# Patient Record
Sex: Female | Born: 1970 | Race: Black or African American | Hispanic: No | Marital: Single | Smoking: Never smoker
Health system: Southern US, Community
[De-identification: ages and names within clinical notes are randomized; demographics above are authoritative.]

## PROBLEM LIST (undated history)

## (undated) DIAGNOSIS — I10 Essential (primary) hypertension: Secondary | ICD-10-CM

## (undated) DIAGNOSIS — G8929 Other chronic pain: Secondary | ICD-10-CM

## (undated) DIAGNOSIS — D509 Iron deficiency anemia, unspecified: Secondary | ICD-10-CM

## (undated) DIAGNOSIS — R51 Headache: Secondary | ICD-10-CM

## (undated) DIAGNOSIS — R7309 Other abnormal glucose: Secondary | ICD-10-CM

## (undated) DIAGNOSIS — R519 Headache, unspecified: Secondary | ICD-10-CM

## (undated) DIAGNOSIS — K219 Gastro-esophageal reflux disease without esophagitis: Secondary | ICD-10-CM

## (undated) HISTORY — DX: Other abnormal glucose: R73.09

## (undated) HISTORY — DX: Essential (primary) hypertension: I10

## (undated) HISTORY — DX: Headache, unspecified: R51.9

## (undated) HISTORY — PX: OTHER SURGICAL HISTORY: SHX169

## (undated) HISTORY — PX: INDUCED ABORTION: SHX677

## (undated) HISTORY — PX: ABDOMINAL HYSTERECTOMY: SHX81

## (undated) HISTORY — PX: TUBAL LIGATION: SHX77

## (undated) HISTORY — DX: Headache: R51

## (undated) HISTORY — PX: NOSE SURGERY: SHX723

## (undated) HISTORY — DX: Gastro-esophageal reflux disease without esophagitis: K21.9

## (undated) HISTORY — DX: Iron deficiency anemia, unspecified: D50.9

## (undated) HISTORY — DX: Other chronic pain: G89.29

## (undated) HISTORY — PX: BREAST REDUCTION SURGERY: SHX8

---

## 2000-05-10 ENCOUNTER — Other Ambulatory Visit: Admission: RE | Admit: 2000-05-10 | Discharge: 2000-05-10 | Payer: Self-pay | Admitting: Emergency Medicine

## 2001-07-06 ENCOUNTER — Encounter: Admission: RE | Admit: 2001-07-06 | Discharge: 2001-07-06 | Payer: Self-pay | Admitting: Emergency Medicine

## 2001-07-06 ENCOUNTER — Encounter: Payer: Self-pay | Admitting: Emergency Medicine

## 2002-03-02 ENCOUNTER — Emergency Department (HOSPITAL_COMMUNITY): Admission: EM | Admit: 2002-03-02 | Discharge: 2002-03-02 | Payer: Self-pay | Admitting: Emergency Medicine

## 2002-03-02 ENCOUNTER — Encounter: Payer: Self-pay | Admitting: Emergency Medicine

## 2002-03-08 ENCOUNTER — Encounter: Payer: Self-pay | Admitting: Emergency Medicine

## 2002-03-08 ENCOUNTER — Ambulatory Visit (HOSPITAL_COMMUNITY): Admission: RE | Admit: 2002-03-08 | Discharge: 2002-03-08 | Payer: Self-pay | Admitting: Emergency Medicine

## 2002-03-10 ENCOUNTER — Ambulatory Visit (HOSPITAL_COMMUNITY): Admission: RE | Admit: 2002-03-10 | Discharge: 2002-03-10 | Payer: Self-pay | Admitting: Neurology

## 2002-03-10 ENCOUNTER — Encounter: Payer: Self-pay | Admitting: Neurology

## 2004-03-10 ENCOUNTER — Other Ambulatory Visit: Admission: RE | Admit: 2004-03-10 | Discharge: 2004-03-10 | Payer: Self-pay | Admitting: Obstetrics and Gynecology

## 2004-04-23 ENCOUNTER — Encounter (INDEPENDENT_AMBULATORY_CARE_PROVIDER_SITE_OTHER): Payer: Self-pay | Admitting: Specialist

## 2004-04-23 ENCOUNTER — Ambulatory Visit (HOSPITAL_COMMUNITY): Admission: RE | Admit: 2004-04-23 | Discharge: 2004-04-23 | Payer: Self-pay | Admitting: Obstetrics and Gynecology

## 2004-06-27 ENCOUNTER — Encounter: Admission: RE | Admit: 2004-06-27 | Discharge: 2004-06-27 | Payer: Self-pay | Admitting: Family Medicine

## 2004-10-16 ENCOUNTER — Ambulatory Visit: Payer: Self-pay | Admitting: Internal Medicine

## 2005-03-25 ENCOUNTER — Ambulatory Visit: Payer: Self-pay | Admitting: Internal Medicine

## 2005-07-24 ENCOUNTER — Ambulatory Visit: Payer: Self-pay | Admitting: Internal Medicine

## 2005-10-15 ENCOUNTER — Other Ambulatory Visit: Admission: RE | Admit: 2005-10-15 | Discharge: 2005-10-15 | Payer: Self-pay | Admitting: Obstetrics and Gynecology

## 2005-11-02 ENCOUNTER — Ambulatory Visit: Payer: Self-pay | Admitting: Internal Medicine

## 2006-02-05 ENCOUNTER — Ambulatory Visit: Payer: Self-pay | Admitting: Internal Medicine

## 2006-07-08 ENCOUNTER — Ambulatory Visit: Payer: Self-pay | Admitting: Internal Medicine

## 2006-07-10 ENCOUNTER — Ambulatory Visit: Payer: Self-pay | Admitting: Internal Medicine

## 2006-09-24 ENCOUNTER — Encounter: Admission: RE | Admit: 2006-09-24 | Discharge: 2006-09-24 | Payer: Self-pay | Admitting: Obstetrics and Gynecology

## 2006-10-12 ENCOUNTER — Encounter: Admission: RE | Admit: 2006-10-12 | Discharge: 2006-10-12 | Payer: Self-pay | Admitting: Obstetrics and Gynecology

## 2006-11-25 ENCOUNTER — Ambulatory Visit: Payer: Self-pay | Admitting: Hematology & Oncology

## 2006-12-24 LAB — CBC WITH DIFFERENTIAL/PLATELET
BASO%: 0.6 % (ref 0.0–2.0)
EOS%: 1.1 % (ref 0.0–7.0)
HCT: 36 % (ref 34.8–46.6)
LYMPH%: 21.5 % (ref 14.0–48.0)
MCH: 23.6 pg — ABNORMAL LOW (ref 26.0–34.0)
MCHC: 31.7 g/dL — ABNORMAL LOW (ref 32.0–36.0)
MONO#: 0.9 10*3/uL (ref 0.1–0.9)
NEUT%: 67.9 % (ref 39.6–76.8)
Platelets: 419 10*3/uL — ABNORMAL HIGH (ref 145–400)
RBC: 4.82 10*6/uL (ref 3.70–5.32)
WBC: 10.1 10*3/uL — ABNORMAL HIGH (ref 3.9–10.0)

## 2006-12-24 LAB — FERRITIN: Ferritin: 354 ng/mL — ABNORMAL HIGH (ref 10–291)

## 2007-03-29 ENCOUNTER — Ambulatory Visit: Payer: Self-pay | Admitting: Hematology & Oncology

## 2007-10-17 ENCOUNTER — Emergency Department (HOSPITAL_COMMUNITY): Admission: EM | Admit: 2007-10-17 | Discharge: 2007-10-17 | Payer: Self-pay | Admitting: Emergency Medicine

## 2007-10-19 ENCOUNTER — Telehealth: Payer: Self-pay | Admitting: Internal Medicine

## 2007-10-19 ENCOUNTER — Ambulatory Visit: Payer: Self-pay | Admitting: Internal Medicine

## 2007-10-19 DIAGNOSIS — I10 Essential (primary) hypertension: Secondary | ICD-10-CM | POA: Insufficient documentation

## 2007-10-19 DIAGNOSIS — R519 Headache, unspecified: Secondary | ICD-10-CM | POA: Insufficient documentation

## 2007-10-19 DIAGNOSIS — R51 Headache: Secondary | ICD-10-CM | POA: Insufficient documentation

## 2007-10-19 DIAGNOSIS — D509 Iron deficiency anemia, unspecified: Secondary | ICD-10-CM | POA: Insufficient documentation

## 2007-10-21 ENCOUNTER — Telehealth: Payer: Self-pay | Admitting: Internal Medicine

## 2007-10-21 LAB — CONVERTED CEMR LAB
Basophils Absolute: 0 10*3/uL (ref 0.0–0.1)
CO2: 33 meq/L — ABNORMAL HIGH (ref 19–32)
Calcium: 8.9 mg/dL (ref 8.4–10.5)
Eosinophils Absolute: 0.2 10*3/uL (ref 0.0–0.6)
GFR calc Af Amer: 104 mL/min
GFR calc non Af Amer: 86 mL/min
Glucose, Bld: 102 mg/dL — ABNORMAL HIGH (ref 70–99)
HCT: 37.7 % (ref 36.0–46.0)
Hemoglobin: 12.9 g/dL (ref 12.0–15.0)
Lymphocytes Relative: 23.8 % (ref 12.0–46.0)
MCHC: 34.3 g/dL (ref 30.0–36.0)
MCV: 86.8 fL (ref 78.0–100.0)
Monocytes Absolute: 0.5 10*3/uL (ref 0.2–0.7)
Monocytes Relative: 4.4 % (ref 3.0–11.0)
Neutro Abs: 7.6 10*3/uL (ref 1.4–7.7)
Neutrophils Relative %: 69.8 % (ref 43.0–77.0)
Potassium: 3.8 meq/L (ref 3.5–5.1)
RDW: 13.3 % (ref 11.5–14.6)
Sodium: 139 meq/L (ref 135–145)
Vitamin B-12: 378 pg/mL (ref 211–911)

## 2007-10-24 ENCOUNTER — Encounter: Payer: Self-pay | Admitting: Internal Medicine

## 2007-10-29 ENCOUNTER — Encounter: Admission: RE | Admit: 2007-10-29 | Discharge: 2007-10-29 | Payer: Self-pay | Admitting: Neurology

## 2007-10-31 ENCOUNTER — Encounter: Admission: RE | Admit: 2007-10-31 | Discharge: 2007-10-31 | Payer: Self-pay | Admitting: Neurology

## 2007-11-23 ENCOUNTER — Encounter: Payer: Self-pay | Admitting: Internal Medicine

## 2007-12-05 ENCOUNTER — Telehealth: Payer: Self-pay | Admitting: Internal Medicine

## 2008-01-02 ENCOUNTER — Encounter: Payer: Self-pay | Admitting: Internal Medicine

## 2008-02-08 ENCOUNTER — Encounter: Payer: Self-pay | Admitting: Internal Medicine

## 2008-05-17 ENCOUNTER — Ambulatory Visit: Payer: Self-pay | Admitting: Internal Medicine

## 2008-05-21 LAB — CONVERTED CEMR LAB
Bilirubin Urine: NEGATIVE
Crystals: NEGATIVE
Eosinophils Absolute: 0.2 10*3/uL (ref 0.0–0.7)
Eosinophils Relative: 1.5 % (ref 0.0–5.0)
HCT: 38.5 % (ref 36.0–46.0)
Hemoglobin: 12.8 g/dL (ref 12.0–15.0)
MCV: 87.5 fL (ref 78.0–100.0)
Monocytes Absolute: 1.3 10*3/uL — ABNORMAL HIGH (ref 0.1–1.0)
Monocytes Relative: 11.4 % (ref 3.0–12.0)
Neutro Abs: 7 10*3/uL (ref 1.4–7.7)
Nitrite: NEGATIVE
RDW: 13.8 % (ref 11.5–14.6)
Total Protein, Urine: NEGATIVE mg/dL
Urine Glucose: NEGATIVE mg/dL
pH: 6.5 (ref 5.0–8.0)

## 2008-06-25 ENCOUNTER — Encounter: Payer: Self-pay | Admitting: Internal Medicine

## 2008-06-29 ENCOUNTER — Encounter: Admission: RE | Admit: 2008-06-29 | Discharge: 2008-06-29 | Payer: Self-pay | Admitting: Obstetrics and Gynecology

## 2008-06-29 ENCOUNTER — Encounter: Payer: Self-pay | Admitting: Internal Medicine

## 2008-07-17 ENCOUNTER — Ambulatory Visit: Payer: Self-pay | Admitting: Internal Medicine

## 2008-07-20 ENCOUNTER — Telehealth: Payer: Self-pay | Admitting: Internal Medicine

## 2008-10-06 ENCOUNTER — Emergency Department (HOSPITAL_COMMUNITY): Admission: EM | Admit: 2008-10-06 | Discharge: 2008-10-06 | Payer: Self-pay | Admitting: Emergency Medicine

## 2008-10-06 ENCOUNTER — Encounter: Payer: Self-pay | Admitting: Internal Medicine

## 2008-10-15 ENCOUNTER — Telehealth: Payer: Self-pay | Admitting: Internal Medicine

## 2008-12-12 ENCOUNTER — Telehealth: Payer: Self-pay | Admitting: Internal Medicine

## 2008-12-13 ENCOUNTER — Ambulatory Visit: Payer: Self-pay | Admitting: Internal Medicine

## 2008-12-14 HISTORY — PX: PARTIAL HYSTERECTOMY: SHX80

## 2009-02-07 ENCOUNTER — Ambulatory Visit (HOSPITAL_COMMUNITY): Admission: RE | Admit: 2009-02-07 | Discharge: 2009-02-08 | Payer: Self-pay | Admitting: Obstetrics and Gynecology

## 2009-02-07 ENCOUNTER — Encounter (INDEPENDENT_AMBULATORY_CARE_PROVIDER_SITE_OTHER): Payer: Self-pay | Admitting: Obstetrics and Gynecology

## 2009-03-07 ENCOUNTER — Encounter: Payer: Self-pay | Admitting: Internal Medicine

## 2009-03-11 ENCOUNTER — Ambulatory Visit: Payer: Self-pay | Admitting: Internal Medicine

## 2009-03-11 DIAGNOSIS — K219 Gastro-esophageal reflux disease without esophagitis: Secondary | ICD-10-CM

## 2009-06-03 IMAGING — CR DG HAND COMPLETE 3+V*L*
3 series · 3 of 3 positions shown · non-contrast
Comparison: None

CLINICAL DATA: Pain in the palm of the left hand, no injury

LEFT HAND - COMPLETE 3+ VIEW

[view not recorded (1 of 3)]
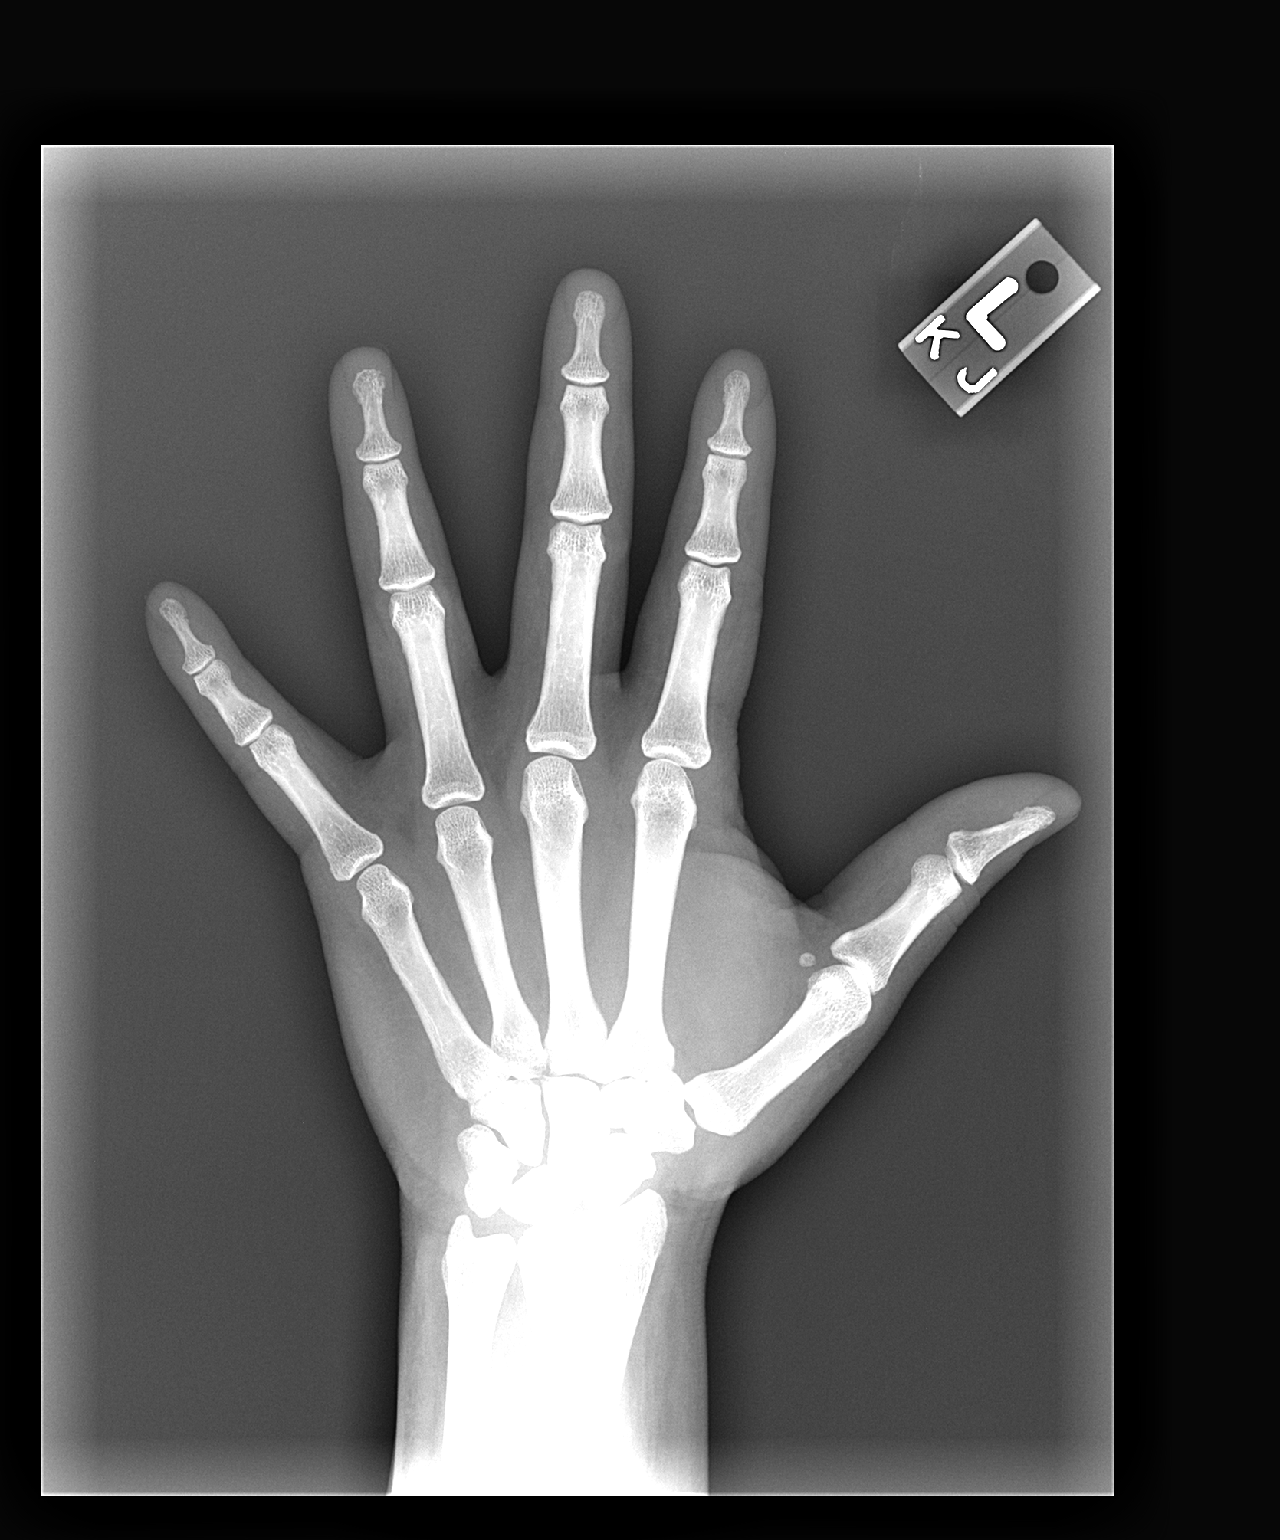

[view not recorded (2 of 3)]
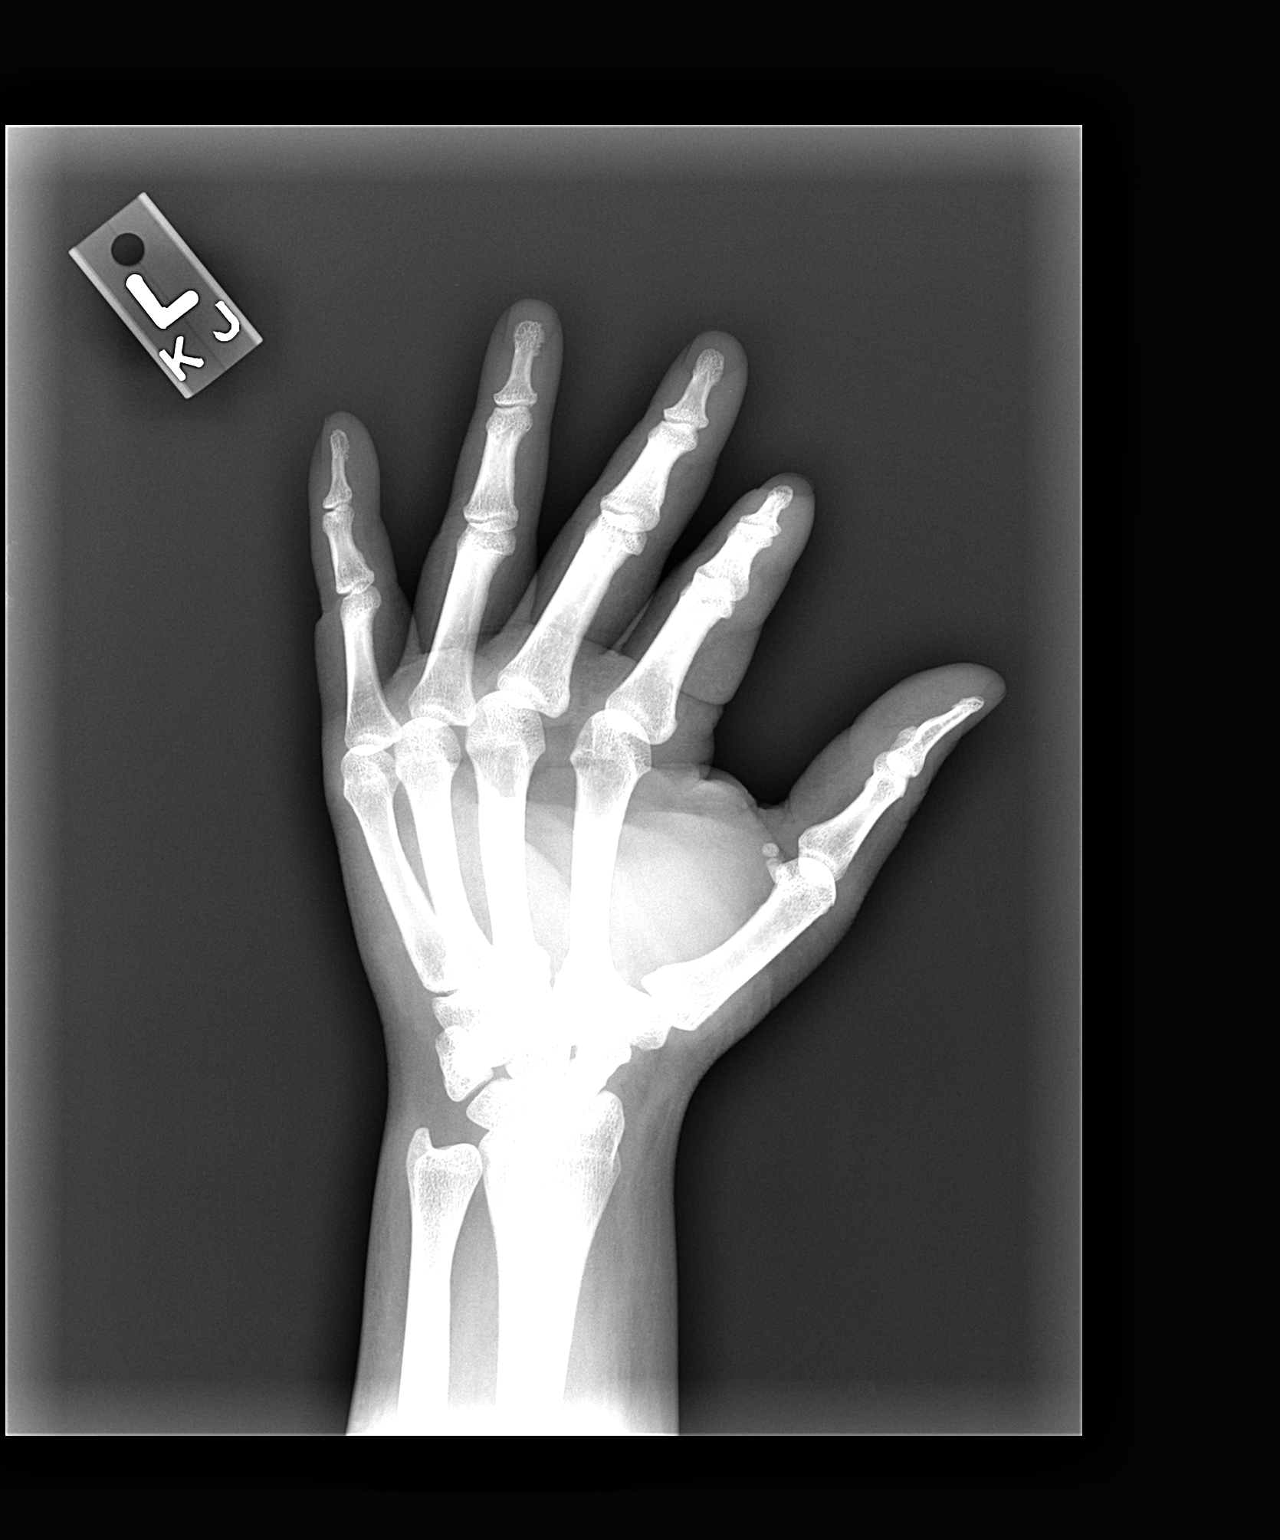

[view not recorded (3 of 3)]
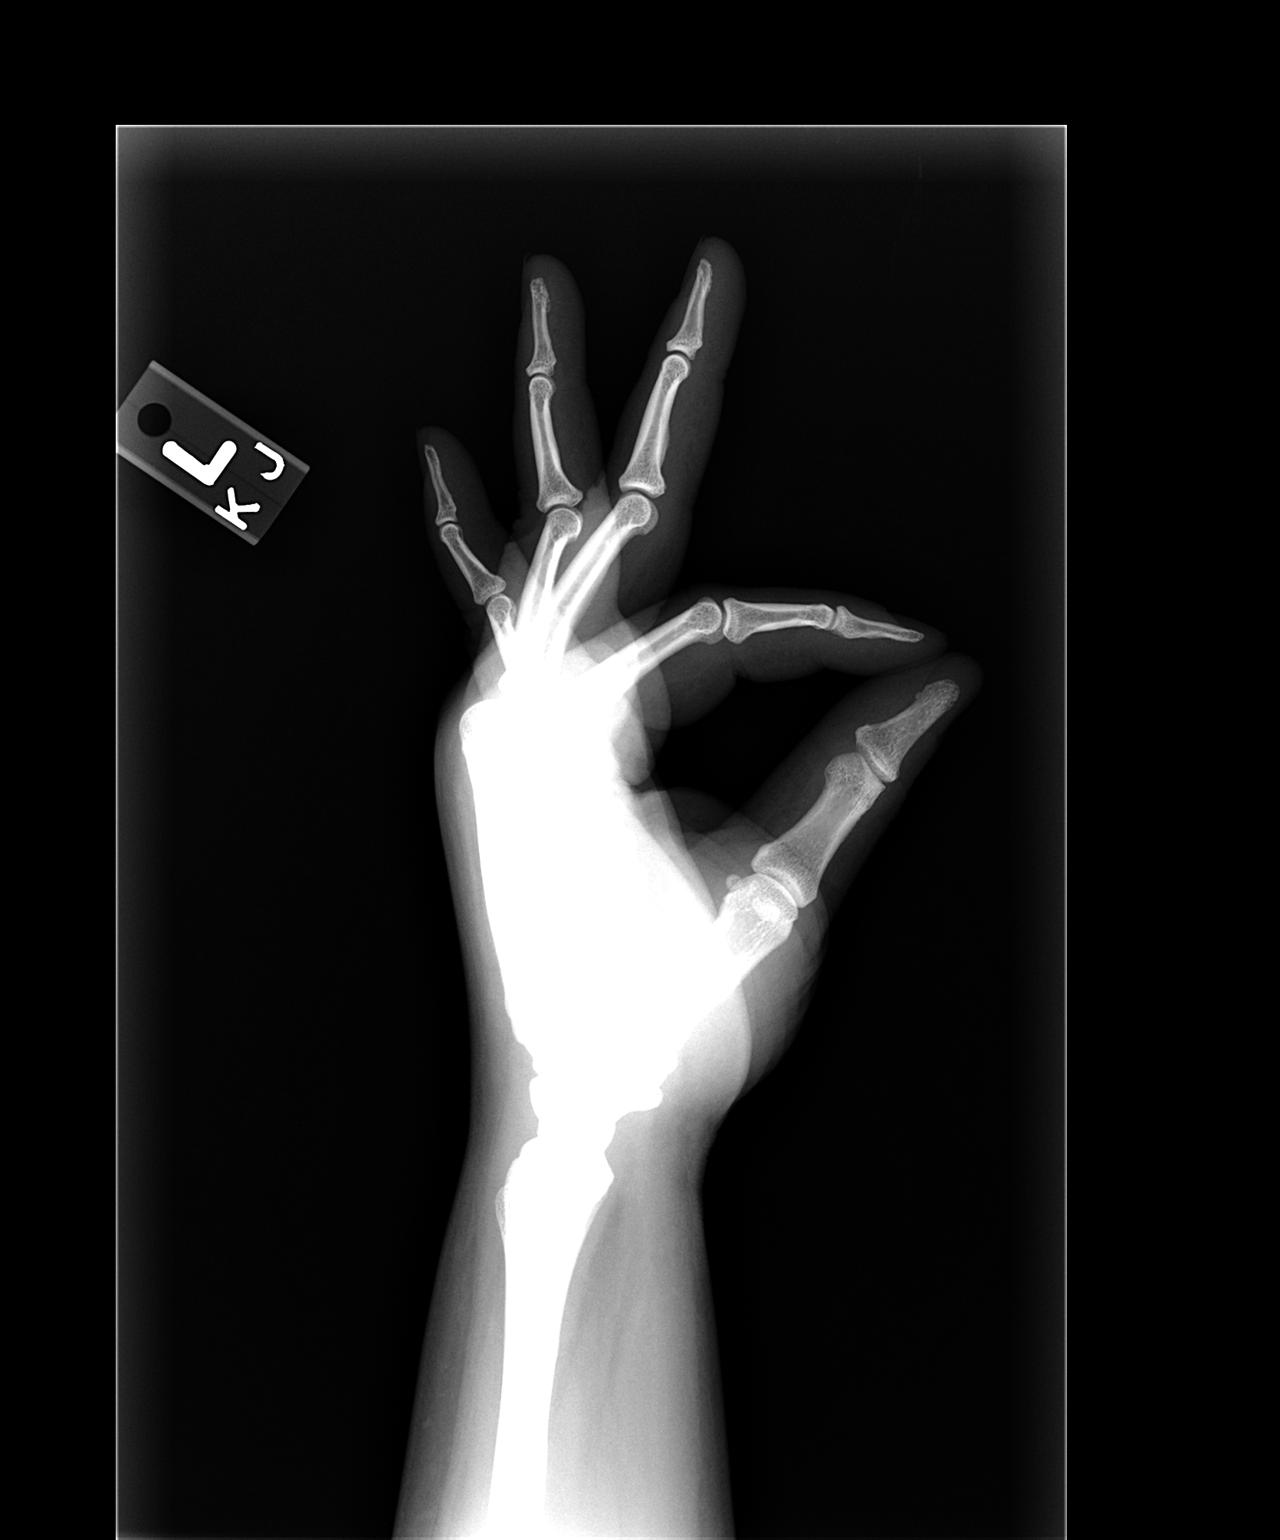

[3 of 3 positions shown; findings below may reference images not displayed]

FINDINGS: The radiocarpal joint space appears normal.  The carpal
bones are in normal position.  MCP, PIP, and DIP joints appear
normal.  No acute abnormality is seen.  No erosion is noted.
IMPRESSION: Negative left hand.

## 2009-06-14 ENCOUNTER — Ambulatory Visit: Payer: Self-pay | Admitting: Internal Medicine

## 2009-06-17 LAB — CONVERTED CEMR LAB
BUN: 16 mg/dL (ref 6–23)
Basophils Relative: 0.3 % (ref 0.0–3.0)
CO2: 29 meq/L (ref 19–32)
Chloride: 102 meq/L (ref 96–112)
Creatinine, Ser: 0.8 mg/dL (ref 0.4–1.2)
Eosinophils Absolute: 0.2 10*3/uL (ref 0.0–0.7)
Glucose, Bld: 111 mg/dL — ABNORMAL HIGH (ref 70–99)
HCT: 31.4 % — ABNORMAL LOW (ref 36.0–46.0)
Hemoglobin: 10.2 g/dL — ABNORMAL LOW (ref 12.0–15.0)
Lymphocytes Relative: 22.9 % (ref 12.0–46.0)
Lymphs Abs: 2.1 10*3/uL (ref 0.7–4.0)
MCHC: 32.4 g/dL (ref 30.0–36.0)
MCV: 69.2 fL — ABNORMAL LOW (ref 78.0–100.0)
Monocytes Absolute: 0.8 10*3/uL (ref 0.1–1.0)
Neutro Abs: 6.1 10*3/uL (ref 1.4–7.7)
RBC: 4.54 M/uL (ref 3.87–5.11)

## 2009-06-24 ENCOUNTER — Ambulatory Visit: Payer: Self-pay | Admitting: Internal Medicine

## 2009-06-24 DIAGNOSIS — E669 Obesity, unspecified: Secondary | ICD-10-CM | POA: Insufficient documentation

## 2009-12-14 DIAGNOSIS — R7309 Other abnormal glucose: Secondary | ICD-10-CM

## 2009-12-14 HISTORY — DX: Other abnormal glucose: R73.09

## 2009-12-20 ENCOUNTER — Ambulatory Visit: Payer: Self-pay | Admitting: Internal Medicine

## 2009-12-20 LAB — CONVERTED CEMR LAB
Basophils Relative: 0.9 % (ref 0.0–3.0)
CO2: 31 meq/L (ref 19–32)
Chloride: 103 meq/L (ref 96–112)
Creatinine, Ser: 0.8 mg/dL (ref 0.4–1.2)
Eosinophils Absolute: 0.1 10*3/uL (ref 0.0–0.7)
Hemoglobin: 12.7 g/dL (ref 12.0–15.0)
Lymphocytes Relative: 28 % (ref 12.0–46.0)
MCHC: 32.2 g/dL (ref 30.0–36.0)
Neutro Abs: 6 10*3/uL (ref 1.4–7.7)
RBC: 4.53 M/uL (ref 3.87–5.11)
Saturation Ratios: 14.3 % — ABNORMAL LOW (ref 20.0–50.0)
Sodium: 140 meq/L (ref 135–145)

## 2009-12-23 ENCOUNTER — Ambulatory Visit: Payer: Self-pay | Admitting: Internal Medicine

## 2009-12-23 ENCOUNTER — Other Ambulatory Visit: Admission: RE | Admit: 2009-12-23 | Discharge: 2009-12-23 | Payer: Self-pay | Admitting: Internal Medicine

## 2009-12-23 ENCOUNTER — Encounter (INDEPENDENT_AMBULATORY_CARE_PROVIDER_SITE_OTHER): Payer: Self-pay | Admitting: *Deleted

## 2009-12-23 LAB — HM PAP SMEAR

## 2009-12-25 ENCOUNTER — Telehealth: Payer: Self-pay | Admitting: Internal Medicine

## 2009-12-25 LAB — CONVERTED CEMR LAB
ALT: 16 units/L (ref 0–35)
AST: 16 units/L (ref 0–37)
Alkaline Phosphatase: 51 units/L (ref 39–117)
Bilirubin, Direct: 0.1 mg/dL (ref 0.0–0.3)
LDL Cholesterol: 127 mg/dL — ABNORMAL HIGH (ref 0–99)
Total Bilirubin: 0.8 mg/dL (ref 0.3–1.2)
Total CHOL/HDL Ratio: 4
Trich, Wet Prep: NONE SEEN

## 2010-01-07 ENCOUNTER — Telehealth: Payer: Self-pay | Admitting: Internal Medicine

## 2010-01-07 ENCOUNTER — Ambulatory Visit: Payer: Self-pay | Admitting: Internal Medicine

## 2010-01-07 LAB — CONVERTED CEMR LAB
Nitrite: POSITIVE
Total Protein, Urine: 30 mg/dL
pH: 8.5 (ref 5.0–8.0)

## 2010-01-20 ENCOUNTER — Encounter (INDEPENDENT_AMBULATORY_CARE_PROVIDER_SITE_OTHER): Payer: Self-pay | Admitting: *Deleted

## 2010-01-20 ENCOUNTER — Ambulatory Visit: Payer: Self-pay | Admitting: Gastroenterology

## 2010-02-03 ENCOUNTER — Ambulatory Visit: Payer: Self-pay | Admitting: Gastroenterology

## 2010-02-05 ENCOUNTER — Encounter: Payer: Self-pay | Admitting: Gastroenterology

## 2010-02-12 ENCOUNTER — Telehealth: Payer: Self-pay | Admitting: Gastroenterology

## 2010-02-26 ENCOUNTER — Telehealth: Payer: Self-pay | Admitting: Gastroenterology

## 2010-03-03 ENCOUNTER — Ambulatory Visit: Payer: Self-pay | Admitting: Gastroenterology

## 2010-03-10 ENCOUNTER — Encounter: Payer: Self-pay | Admitting: Gastroenterology

## 2010-03-27 ENCOUNTER — Telehealth: Payer: Self-pay | Admitting: Gastroenterology

## 2010-07-10 ENCOUNTER — Ambulatory Visit: Payer: Self-pay | Admitting: Internal Medicine

## 2010-07-10 LAB — CONVERTED CEMR LAB
AST: 19 units/L (ref 0–37)
Albumin: 4 g/dL (ref 3.5–5.2)
Alkaline Phosphatase: 58 units/L (ref 39–117)
Basophils Relative: 0.8 % (ref 0.0–3.0)
Calcium: 8.6 mg/dL (ref 8.4–10.5)
Chloride: 96 meq/L (ref 96–112)
GFR calc non Af Amer: 91.73 mL/min (ref >60)
HCT: 39.6 % (ref 36.0–46.0)
Hemoglobin: 13.4 g/dL (ref 12.0–15.0)
Lymphocytes Relative: 22.1 % (ref 12.0–46.0)
Lymphs Abs: 2.5 10*3/uL (ref 0.7–4.0)
MCHC: 33.8 g/dL (ref 30.0–36.0)
Monocytes Relative: 9.1 % (ref 3.0–12.0)
Neutro Abs: 7.6 10*3/uL (ref 1.4–7.7)
RBC: 4.52 M/uL (ref 3.87–5.11)
Total Protein: 7.3 g/dL (ref 6.0–8.3)

## 2010-07-11 ENCOUNTER — Telehealth: Payer: Self-pay | Admitting: Internal Medicine

## 2010-07-11 ENCOUNTER — Encounter: Payer: Self-pay | Admitting: Internal Medicine

## 2010-07-22 ENCOUNTER — Telehealth (INDEPENDENT_AMBULATORY_CARE_PROVIDER_SITE_OTHER): Payer: Self-pay | Admitting: *Deleted

## 2010-07-28 ENCOUNTER — Ambulatory Visit: Payer: Self-pay | Admitting: Internal Medicine

## 2010-07-28 DIAGNOSIS — L218 Other seborrheic dermatitis: Secondary | ICD-10-CM

## 2010-07-28 DIAGNOSIS — G4700 Insomnia, unspecified: Secondary | ICD-10-CM

## 2010-08-07 ENCOUNTER — Telehealth: Payer: Self-pay | Admitting: Internal Medicine

## 2010-09-12 ENCOUNTER — Telehealth: Payer: Self-pay | Admitting: Internal Medicine

## 2010-11-04 ENCOUNTER — Ambulatory Visit: Payer: Self-pay | Admitting: Internal Medicine

## 2010-11-11 LAB — CONVERTED CEMR LAB
Albumin: 3.6 g/dL (ref 3.5–5.2)
Alkaline Phosphatase: 54 units/L (ref 39–117)
Basophils Relative: 0.4 % (ref 0.0–3.0)
Bilirubin Urine: NEGATIVE
CO2: 30 meq/L (ref 19–32)
Chloride: 103 meq/L (ref 96–112)
Direct LDL: 137.4 mg/dL
Eosinophils Absolute: 0.1 10*3/uL (ref 0.0–0.7)
HCT: 39.2 % (ref 36.0–46.0)
Hemoglobin: 13.2 g/dL (ref 12.0–15.0)
Lymphocytes Relative: 30.9 % (ref 12.0–46.0)
MCHC: 33.6 g/dL (ref 30.0–36.0)
MCV: 89.2 fL (ref 78.0–100.0)
Neutro Abs: 5.3 10*3/uL (ref 1.4–7.7)
Nitrite: NEGATIVE
Potassium: 4.2 meq/L (ref 3.5–5.1)
RBC: 4.39 M/uL (ref 3.87–5.11)
Sodium: 139 meq/L (ref 135–145)
Total CHOL/HDL Ratio: 4
Total Protein, Urine: 30 mg/dL
Total Protein: 6.5 g/dL (ref 6.0–8.3)
Urine Glucose: NEGATIVE mg/dL

## 2011-01-13 NOTE — Procedures (Signed)
Summary: Upper Endoscopy  Patient: [name] [name] Note: All result statuses are Final unless otherwise noted.  Tests: (1) Upper Endoscopy (EGD)   EGD Upper Endoscopy       DONE     [facility]     [address]     [place], [place]  [address]           ENDOSCOPY PROCEDURE REPORT           PATIENT:  [name], [name]  MR#:  [id number]     BIRTHDATE:  12-Jul-1971, 39 yrs. old  GENDER:  female           ENDOSCOPIST:  Judie Petit T. Russella Dar, MD, [facility]     Referred by:  Linda Hedges. Plotnikov, M.D.           PROCEDURE DATE:  02/03/2010     PROCEDURE:  EGD with biopsy     ASA CLASS:  Class II     INDICATIONS:  GERD           MEDICATIONS:  Fentanyl 50 mcg IV, Versed 5 mg IV     TOPICAL ANESTHETIC:  Exactacain Spray           DESCRIPTION OF PROCEDURE:   After the risks benefits and     alternatives of the procedure were thoroughly explained, informed     consent was obtained.  The LB GIF-H180 [id number] endoscope was     introduced through the mouth and advanced to the second portion of     the duodenum, without limitations.  The instrument was slowly     withdrawn as the mucosa was fully examined.     <<PROCEDUREIMAGES>>           Mild gastritis was found in the body and the antrum of the     stomach. It was granular and erythematous. Biopsies of the antrum     and body of the stomach were obtained and sent to pathology.  The     duodenal bulb was normal in appearance, as was the postbulbar     duodenum. The esophagus and gastroesophageal junction were     completely normal in appearance. Otherwise the examination was     normal. Retroflexed views revealed no abnormalities.  The scope     was then withdrawn from the patient and the procedure completed.           COMPLICATIONS:  None           ENDOSCOPIC IMPRESSION:     1) Mild gastritis           RECOMMENDATIONS:     1) await pathology results     2) anti-reflux regimen     3) continue PPI           Larnell Granlund T. Russella Dar, MD, Clementeen Graham          n.     eSIGNED:   Venita Lick. [place] at 02/03/2010 04:43 PM           Darcella Cheshire, [id number]  Note: An exclamation mark (!) indicates a result that was not dispersed into the flowsheet. Document Creation Date: 02/03/2010 4:43 PM _______________________________________________________________________  (1) Order result status: Final Collection or observation date-time: 02/03/2010 16:38 Requested date-time:  Receipt date-time:  Reported date-time:  Referring Physician:   Ordering Physician: Claudette Head [phone]) Specimen Source:  Source: Launa Grill Order Number: [id number] Lab site:

## 2011-01-13 NOTE — Progress Notes (Signed)
Summary: Maxalt problem  Phone Note Call from Patient Call back at [facility] Phone [phone]   Summary of Call: Pt took maxalt given by Dr Yetta Barre yesterday. She says med caused increased pain and severe pressure in her head & jaw w/nausea. Today she continues to have "nagging" h/a. What do you advise?  Initial call taken by: Lamar Sprinkles, CMA,  July 11, 2010 10:00 AM  Follow-up for Phone Call        Try Fioricet pls Sat Clinic if sick Follow-up by: Tresa Garter MD,  July 11, 2010 5:19 PM  Additional Follow-up for Phone Call Additional follow up Details #1::        Left vm on hm # to check w/pharm or call for sat office visit if needed Additional Follow-up by: Lamar Sprinkles, CMA,  July 11, 2010 6:07 PM    New/Updated Medications: FIORICET 50-325-40 MG TABS (BUTALBITAL-APAP-CAFFEINE) 1-2 by mouth two times a day as needed migraine or tension HA Prescriptions: FIORICET 50-325-40 MG TABS (BUTALBITAL-APAP-CAFFEINE) 1-2 by mouth two times a day as needed migraine or tension HA  #60 x 2   Entered and Authorized by:   Tresa Garter MD   Signed by:   Tresa Garter MD on 07/11/2010   Method used:   Electronically to        [facility] [address] (retail)       [address]       [place], [place]  [id number]       Ph: [phone]       Fax: [phone]   RxID:   [id number]

## 2011-01-13 NOTE — Assessment & Plan Note (Signed)
Summary: 3 MO ROV /NWS  #   Vital Signs:  Patient profile:   40 year old female Height:      64 inches Weight:      237 pounds BMI:     40.83 Temp:     97.8 degrees F oral Pulse rate:   72 / minute Pulse rhythm:   regular Resp:     16 per minute BP sitting:   140 / 82  (left arm) Cuff size:   large  Vitals Entered By: [name], CMA(AAMA) (November 04, 2010 8:18 AM) CC: 3 mo f/u Is Patient Diabetic? No Comments pt states she is not takinf Meloxicam or Fioricet.  She states she was given Motrin, Relpax, Atenolol and Seroquel by Dr. Leighton Roach but is unsure of the strengths.   Primary Care Provider:  [name] MD  CC:  3 mo f/u.  History of Present Illness: C/o HTN, GERD C/o daily HAs The patient presents for a preventive health examination   Current Medications (verified): 1)  Diovan Hct 160-25 Mg  Tabs (Valsartan-Hydrochlorothiazide) .Marland Kitchen.. 1 Once Daily 2)  Vitamin D3 1000 Unit  Tabs (Cholecalciferol) .Marland Kitchen.. 1 By Mouth Daily 3)  Meloxicam 15 Mg Tabs (Meloxicam) .... 1/2 or One By Mouth Daily Pc X 1 Wk Then Prn 4)  Dexilant 60 Mg Cpdr (Dexlansoprazole) .Marland Kitchen.. 1 By Mouth Q Am For Indigestion 5)  Triamcinolone Acetonide 0.5 % Crea (Triamcinolone Acetonide) .... Use Two Times A Day Prn 6)  Maxalt 10 Mg Tabs (Rizatriptan Benzoate) .... Take One As Neede For Headache 7)  Fioricet 50-325-40 Mg Tabs (Butalbital-Apap-Caffeine) .Marland Kitchen.. 1-2 By Mouth Two Times A Day As Needed Migraine or Tension Ha 8)  Zolpidem Tartrate 10 Mg Tabs (Zolpidem Tartrate) .... 1/2-1 Tab At Bedtime As Needed Insomnia 9)  Ketoconazole 2 % Sham (Ketoconazole) .... Use 2 Times A Week As Dirrected For Dandruff 10)  Diflucan 150 Mg Tabs (Fluconazole) .Marland Kitchen.. 1 By Mouth Once Daily Once For Yeast Infection 11)  Relpax 40 Mg Tabs (Eletriptan Hydrobromide) .... As Needed For Migraines  Allergies (verified): 1)  ! Macrobid 2)  Cipro  Past History:  Past Medical History: Last updated: 07/28/2010 HA, chronic Dr  Vela Prose, neg MRI 2005 Anemia-iron deficiency Hypertension GERD Elev glu 2011  Past Surgical History: Last updated: 01/20/2010 Hysterectomy 2010 partial Nasal surgery Breast reduction surgery cyst removed from vagina abortion x 4 1990-1994 Tubal Ligation  Family History: Last updated: 12/23/2009 Family History Hypertension M esoph CA at 51  Social History: Last updated: 07/10/2010 Occupation: Single 2 kids, has a boyfriend Never Smoked Alcohol use-no Drug use-no Regular exercise-yes  Review of Systems       The patient complains of weight gain and headaches.  The patient denies anorexia, fever, weight loss, vision loss, decreased hearing, hoarseness, chest pain, syncope, dyspnea on exertion, peripheral edema, prolonged cough, hemoptysis, abdominal pain, melena, hematochezia, severe indigestion/heartburn, hematuria, incontinence, genital sores, muscle weakness, suspicious skin lesions, transient blindness, difficulty walking, depression, unusual weight change, abnormal bleeding, enlarged lymph nodes, angioedema, and breast masses.    Physical Exam  General:  alert, well-developed, well-nourished,  overweight-appearing.   Head:  Normocephalic and atraumatic without obvious abnormalities. No apparent alopecia or balding. Eyes:  vision grossly intact, pupils equal, pupils round, pupils reactive to light, pupils react to accomodation, no injection, no optic disk abnormalities, and no nystagmus.   Ears:  R ear normal and L ear normal.   Nose:  External nasal examination shows no deformity or inflammation. Nasal  mucosa are pink and moist without lesions or exudates. Mouth:  Oral mucosa and oropharynx without lesions or exudates.  Teeth in good repair. Neck:  supple, full ROM, no masses, no thyromegaly, no thyroid nodules or tenderness, normal carotid upstroke, no cervical lymphadenopathy, and no neck tenderness.   Lungs:  normal respiratory effort, no intercostal retractions, no  accessory muscle use, normal breath sounds, no dullness, no fremitus, no crackles, and no wheezes.   Heart:  normal rate, regular rhythm, no murmur, no gallop, no rub, and no JVD.   Abdomen:  soft, non-tender, normal bowel sounds, no distention, no masses, no guarding, no rigidity, no rebound tenderness, no abdominal hernia, no inguinal hernia, no hepatomegaly, and no splenomegaly.   Msk:  normal ROM, no joint tenderness, no joint swelling, no joint warmth, no redness over joints, no joint deformities, no joint instability, and no crepitation.   Pulses:  R and L carotid,radial,femoral,dorsalis pedis and posterior tibial pulses are full and equal bilaterally Neurologic:  alert & oriented X3, cranial nerves II-XII intact, strength normal in all extremities, sensation intact to light touch, sensation intact to pinprick, gait normal, DTRs symmetrical and normal, finger-to-nose normal, heel-to-shin normal, toes down bilaterally on Babinski, and Romberg negative.   Skin:  Intact without suspicious lesions or rashes Scaly scalp Cervical Nodes:  No lymphadenopathy noted Psych:  Cognition and judgment appear intact. Alert and cooperative with normal attention span and concentration. No apparent delusions, illusions, hallucinations   Impression & Recommendations:  Problem # 1:  HEADACHE (ICD-784.0) Assessment Unchanged Per HA Clinic: MRA and Sleep study is pending - now on Atenolol and Seroquel Her updated medication list for this problem includes:    Meloxicam 15 Mg Tabs (Meloxicam) .Marland Kitchen... 1/2 or one by mouth daily pc x 1 wk then prn    Maxalt 10 Mg Tabs (Rizatriptan benzoate) .Marland Kitchen... Take one as neede for headache    Fioricet 50-325-40 Mg Tabs (Butalbital-apap-caffeine) .Marland Kitchen... 1-2 by mouth two times a day as needed migraine or tension ha    Relpax 40 Mg Tabs (Eletriptan hydrobromide) .Marland Kitchen... As needed for migraines  Problem # 2:  HYPERTENSION (ICD-401.9) Assessment: Improved  The following medications  were removed from the medication list:    Diovan Hct 160-25 Mg Tabs (Valsartan-hydrochlorothiazide) .Marland Kitchen... 1 once daily Her updated medication list for this problem includes:    Tribenzor 40-5-25 Mg Tabs (Olmesartan-amlodipine-hctz) .Marland Kitchen... 1 by mouth qd  Problem # 3:  OBESITY (ICD-278.00) Assessment: Unchanged  Problem # 4:  PHYSICAL EXAMINATION (ICD-V70.0) Assessment: New Health and age related issues were discussed. Available screening tests and vaccinations were discussed as well. Healthy life style including good diet and exercise was discussed.  The labs were reviewed with the patient.   Complete Medication List: 1)  Vitamin D3 1000 Unit Tabs (Cholecalciferol) .Marland Kitchen.. 1 by mouth daily 2)  Meloxicam 15 Mg Tabs (Meloxicam) .... 1/2 or one by mouth daily pc x 1 wk then prn 3)  Dexilant 60 Mg Cpdr (Dexlansoprazole) .Marland Kitchen.. 1 by mouth q am for indigestion 4)  Triamcinolone Acetonide 0.5 % Crea (Triamcinolone acetonide) .... Use two times a day prn 5)  Maxalt 10 Mg Tabs (Rizatriptan benzoate) .... Take one as neede for headache 6)  Fioricet 50-325-40 Mg Tabs (Butalbital-apap-caffeine) .Marland Kitchen.. 1-2 by mouth two times a day as needed migraine or tension ha 7)  Zolpidem Tartrate 10 Mg Tabs (Zolpidem tartrate) .... 1/2-1 tab at bedtime as needed insomnia 8)  Ketoconazole 2 % Sham (Ketoconazole) .... Use 2 times  a week as dirrected for dandruff 9)  Diflucan 150 Mg Tabs (Fluconazole) .Marland Kitchen.. 1 by mouth once daily once for yeast infection 10)  Relpax 40 Mg Tabs (Eletriptan hydrobromide) .... As needed for migraines 11)  Tribenzor 40-5-25 Mg Tabs (Olmesartan-amlodipine-hctz) .Marland Kitchen.. 1 by mouth qd  Other Orders: Flu Vaccine 84yrs + ([address]202) Admin 1st Vaccine (54270)  Patient Instructions: 1)  Please schedule a follow-up appointment in 4 months. Prescriptions: TRIBENZOR 40-5-25 MG TABS (OLMESARTAN-AMLODIPINE-HCTZ) 1 by mouth qd  #90 x 3   Entered and Authorized by:   Tresa Garter MD   Signed by:   Tresa Garter MD on 11/04/2010   Method used:   Print then Give to Patient   RxID:   [id number]    Orders Added: 1)  Est. Patient 18-39 years [99395] 2)  Flu Vaccine 11yrs + [73710] 3)  Admin 1st Vaccine [62694]   Immunization History:  Tetanus/Td Immunization History:    Tetanus/Td:  historical (07/17/2009)  Immunizations Administered:  Influenza Vaccine # 1:    Vaccine Type: Fluvax 3+    Site: left deltoid    Mfr: Sanofi Pasteur    Dose: 0.5 ml    Route: IM    Given by: [name], CMA(AAMA)    Exp. Date: 06/13/2011    Lot #: [id number]    VIS given: 07/08/10 version given November 04, 2010.   Immunization History:  Tetanus/Td Immunization History:    Tetanus/Td:  Historical (07/17/2009)  Immunizations Administered:  Influenza Vaccine # 1:    Vaccine Type: Fluvax 3+    Site: left deltoid    Mfr: Sanofi Pasteur    Dose: 0.5 ml    Route: IM    Given by: [name], CMA(AAMA)    Exp. Date: 06/13/2011    Lot #: [id number]    VIS given: 07/08/10 version given November 04, 2010.

## 2011-01-13 NOTE — Progress Notes (Signed)
Summary: H Pilory question  Phone Note Call from Patient Call back at Home Phone [phone]   Call For: Dr Russella Dar Summary of Call: Was given medication for H pyllori-wonders how she will know if the infection is gone. Initial call taken by: Leanor Kail [facility],  February 26, 2010 11:01 AM  Follow-up for Phone Call        Patient didn't take pylera as perscribed, she says it took her about 15 day s to complete it .  She says she didn't take some doses due to nausea or she would forget.  patient is wondering if she needs to be checked again for H. pylori.  Please advise Follow-up by: Darcey Nora RN, CGRN,  February 26, 2010 2:22 PM  Additional Follow-up for Phone Call Additional follow up Details #1::        Obtain a stool H. pylori antigen test to determine if she needs retreatment. Additional Follow-up by: Meryl Dare MD FACG,  February 26, 2010 3:06 PM    Additional Follow-up for Phone Call Additional follow up Details #2::    Patient  aware , she will come for lab.

## 2011-01-13 NOTE — Letter (Signed)
Summary: EGD Instructions  [facility]  [address] [place], [place] [address]   Phone: [phone]  Fax: [phone]       [name] [name]    04-20-1971    MRN: [id number]       Procedure Day /Date: Monday February 21st, 2011     Arrival Time:  3:00pm     Procedure Time: 4:00pm     Location of Procedure:                    _  x_ [facility] (4th Floor)    PREPARATION FOR ENDOSCOPY   On  02/03/10  THE DAY OF THE PROCEDURE:  1.   No solid foods, milk or milk products are allowed after midnight the night before your procedure.  2.   Do not drink anything colored red or purple.  Avoid juices with pulp.  No orange juice.  3.  You may drink clear liquids until 1:00pm, which is 2 hours before your procedure.                                                                                                CLEAR LIQUIDS INCLUDE: Water Jello Ice Popsicles Tea (sugar ok, no milk/cream) Powdered fruit flavored drinks Coffee (sugar ok, no milk/cream) Gatorade Juice: apple, white grape, white cranberry  Lemonade Clear bullion, consomm, broth Carbonated beverages (any kind) Strained chicken noodle soup Hard Candy   MEDICATION INSTRUCTIONS  Unless otherwise instructed, you should take regular prescription medications with a small sip of water as early as possible the morning of your procedure.           OTHER INSTRUCTIONS  You will need a responsible adult at least 40 years of age to accompany you and drive you home.   This person must remain in the waiting room during your procedure.  Wear loose fitting clothing that is easily removed.  Leave jewelry and other valuables at home.  However, you may wish to bring a book to read or an iPod/MP3 player to listen to music as you wait for your procedure to start.  Remove all body piercing jewelry and leave at home.  Total time from sign-in until discharge is approximately 2-3 hours.  You should  go home directly after your procedure and rest.  You can resume normal activities the day after your procedure.  The day of your procedure you should not:   Drive   Make legal decisions   Operate machinery   Drink alcohol   Return to work  You will receive specific instructions about eating, activities and medications before you leave.    The above instructions have been reviewed and explained to me by   Marchelle Folks.    I fully understand and can verbalize these instructions _____________________________ Date _________

## 2011-01-13 NOTE — Progress Notes (Signed)
Summary: Diflucan  Phone Note Call from Patient Call back at Home Phone [phone]   Summary of Call: Patient is requesting rx for vaginal yeast infection. C/o vaginal itching, burning & white d/c.  Initial call taken by: Lamar Sprinkles, CMA,  September 12, 2010 10:44 AM  Follow-up for Phone Call        ok Diflucan Follow-up by: Tresa Garter MD,  September 12, 2010 1:30 PM  Additional Follow-up for Phone Call Additional follow up Details #1::        Left vm for pt to check w/her pharmacy Additional Follow-up by: Lamar Sprinkles, CMA,  September 12, 2010 2:31 PM    New/Updated Medications: DIFLUCAN 150 MG TABS (FLUCONAZOLE) 1 by mouth once daily once for yeast infection Prescriptions: DIFLUCAN 150 MG TABS (FLUCONAZOLE) 1 by mouth once daily once for yeast infection  #1 x 1   Entered and Authorized by:   Tresa Garter MD   Signed by:   Lamar Sprinkles, CMA on 09/12/2010   Method used:   Electronically to        [facility] (retail)       [address]       [place], [place]  [id number]       Ph: [phone]       Fax: [phone]   RxID:   [id number]

## 2011-01-13 NOTE — Progress Notes (Signed)
Summary: Triage  Phone Note Call from Patient Call back at Home Phone [phone]   Caller: Patient Call For: Dr. Russella Dar Reason for Call: Talk to Nurse Summary of Call: Pt is bloated and wants to know if the Pylera could be causing constipation? Initial call taken by: Karna Christmas,  February 12, 2010 1:57 PM  Follow-up for Phone Call        patient has been constipated since startin gon Pylera, patient advised ok to start on laxative.  All other questions answered. Follow-up by: Darcey Nora RN, CGRN,  February 12, 2010 2:36 PM

## 2011-01-13 NOTE — Letter (Signed)
Summary: New Patient letter  [facility]  [address] [place], [place] [address]   Phone: [phone]  Fax: [phone]       12/23/2009 MRN: [id number]  [name] [name] [address] [place], [place]  [address]  Dear [name] [name],  Welcome to the Gastroenterology Division at [facility].    You are scheduled to see Dr.  Russella Dar on 01-15-10 at 10:15am on the 3rd floor at [facility], [address]  We ask that you try to arrive at our office 15 minutes prior to your appointment time to allow for check-in.  We would like you to complete the enclosed self-administered evaluation form prior to your visit and bring it with you on the day of your appointment.  We will review it with you.  Also, please bring a complete list of all your medications or, if you prefer, bring the medication bottles and we will list them.  Please bring your insurance card so that we may make a copy of it.  If your insurance requires a referral to see a specialist, please bring your referral form from your primary care physician.  Co-payments are due at the time of your visit and may be paid by cash, check or credit card.     Your office visit will consist of a consult with your physician (includes a physical exam), any laboratory testing he/she may order, scheduling of any necessary diagnostic testing (e.g. x-ray, ultrasound, CT-scan), and scheduling of a procedure (e.g. Endoscopy, Colonoscopy) if required.  Please allow enough time on your schedule to allow for any/all of these possibilities.    If you cannot keep your appointment, please call [phone] to cancel or reschedule prior to your appointment date.  This allows [place] the opportunity to schedule an appointment for another patient in need of care.  If you do not cancel or reschedule by 5 p.m. the business day prior to your appointment date, you will be charged a $50.00 late cancellation/no-show fee.    Thank you for choosing  [facility] for your medical needs.  We appreciate the opportunity to care for you.  Please visit [place] at our website  to learn more about our practice.                     Sincerely,                                                             The Gastroenterology Division

## 2011-01-13 NOTE — Progress Notes (Signed)
Summary: triage  Phone Note Call from Patient Call back at Home Phone [phone]   Caller: Patient Call For: Russella Dar  Reason for Call: Talk to Nurse Summary of Call: Patient in a lot of pain has severe constipation (crying) wants to speak to a nurse Initial call taken by: Tawni Levy,  March 27, 2010 8:32 AM  Follow-up for Phone Call        Patient  c/o constipation.  She took a dulcolax last night and has had no relief.  She states she is having cramping this am.  She took a suppositlry a few minutes before I returned her call.  So far no results.  She has some bright red rectal bleeding from straining.  I have asked her to try Miralax 2-3 doses today.  I have asked her to call back this afternoon if she has no results from the Miralax and suppositories. Follow-up by: Darcey Nora RN, CGRN,  March 27, 2010 9:11 AM  Additional Follow-up for Phone Call Additional follow up Details #1::        Agree with above. High fiber diet and 8 glasses of water daily after constipation has improved. Additional Follow-up by: Meryl Dare MD Clementeen Graham,  March 27, 2010 9:12 AM

## 2011-01-13 NOTE — Letter (Signed)
Summary: [facility] Consult Scheduled Letter  [name]  [address] [place], [place] [address]   Phone: [phone]  Fax: [phone]      12/23/2009 MRN: [id number]  [name] [name] [address] [place], [place]  [address]    Dear Ms. Tallie,      We have scheduled an appointment for you. At the recommendation of Dr.Plotnikov,we have scheduled you a consult with Dr.Stark LBGI)on Febuary,2 2010 at 10:15am.Their phone number is [phone].If this appointment day and time is not convenient for you, please feel free to call the office of the doctor you are being referred to at the number listed above and reschedule the appointment.  [facility] [address] [place], [place] [address]  Thank you,  Patient Care Coordinator [facility]

## 2011-01-13 NOTE — Assessment & Plan Note (Signed)
Summary: GERD/YF   History of Present Illness Visit Type: Initial Consult Primary GI MD: Elie Goody MD [facility] Primary Provider: [name] MD Requesting Provider: [name] MD Chief Complaint: GERD, mother just diagnosed with esophageal cancer History of Present Illness:   This is a 40 year old female who has had problems with reflux for about 10 years. Her symptoms are currently well-controlled on Dexilant.  She occasionally has breakthrough symptoms. Her mother was diagnosed with esophageal cancer. Several months ago.   GI Review of Systems    Reports acid reflux, belching, bloating, and  loss of appetite.      Denies abdominal pain, chest pain, dysphagia with liquids, dysphagia with solids, heartburn, nausea, vomiting, vomiting blood, weight loss, and  weight gain.        Denies anal fissure, black tarry stools, change in bowel habit, constipation, diarrhea, diverticulosis, fecal incontinence, heme positive stool, hemorrhoids, irritable bowel syndrome, jaundice, light color stool, liver problems, rectal bleeding, and  rectal pain.   Current Medications (verified): 1)  Diovan Hct 160-25 Mg  Tabs (Valsartan-Hydrochlorothiazide) .Marland Kitchen.. 1 Once Daily 2)  Vitamin D3 1000 Unit  Tabs (Cholecalciferol) .Marland Kitchen.. 1 By Mouth Daily 3)  Meloxicam 15 Mg Tabs (Meloxicam) .... 1/2 or One By Mouth Daily Pc X 1 Wk Then Prn 4)  Dexilant 60 Mg Cpdr (Dexlansoprazole) .Marland Kitchen.. 1 By Mouth Q Am For Indigestion 5)  Triamcinolone Acetonide 0.5 % Crea (Triamcinolone Acetonide) .... Use Two Times A Day Prn  Allergies (verified): 1)  ! Macrobid 2)  Cipro  Past History:  Past Medical History: Reviewed history from 03/11/2009 and no changes required. HA, chronic Dr Vela Prose, neg MRI 2005 Anemia-iron deficiency Hypertension GERD  Past Surgical History: Hysterectomy 2010 partial Nasal surgery Breast reduction surgery cyst removed from vagina abortion x 4 1990-1994 Tubal Ligation  Family  History: Reviewed history from 12/23/2009 and no changes required. Family History Hypertension M esoph CA at 80  Social History: Reviewed history from 12/23/2009 and no changes required. Occupation: Single 2 kids, has a boyfriend  Review of Systems       The patient complains of voice change.         The pertinent positives and negatives are noted as above and in the HPI. All other ROS were reviewed and were negative.  Vital Signs:  Patient profile:   40 year old female Height:      64 inches Weight:      220 pounds BMI:     37.90 Pulse rate:   68 / minute Pulse rhythm:   regular BP sitting:   120 / 80  (left arm)  Vitals Entered By: Chales Abrahams CMA Duncan Dull) (January 20, 2010 10:11 AM)  Physical Exam  General:  Well developed, well nourished, no acute distress. Head:  Normocephalic and atraumatic. Eyes:  PERRLA, no icterus. Ears:  Normal auditory acuity. Mouth:  No deformity or lesions, dentition normal. Neck:  Supple; no masses or thyromegaly. Lungs:  Clear throughout to auscultation. Heart:  Regular rate and rhythm; no murmurs, rubs,  or bruits. Abdomen:  Soft, nontender and nondistended. No masses, hepatosplenomegaly or hernias noted. Normal bowel sounds. Msk:  Symmetrical with no gross deformities. Normal posture. Pulses:  Normal pulses noted. Extremities:  No clubbing, cyanosis, edema or deformities noted. Neurologic:  Alert and  oriented x4;  grossly normal neurologically. Skin:  Intact without significant lesions or rashes. Cervical Nodes:  No significant cervical adenopathy. Axillary Nodes:  No significant axillary adenopathy. Psych:  Alert and  cooperative. Normal mood and affect.  Impression & Recommendations:  Problem # 1:  GERD (ICD-530.81) Chronic GERD. Rule out Barrett's esophagus. She is given all standard information on dietary and lifestyle measures for GERD. Continue Dexilant. The risks, benefits and alternatives to endoscopy with possible biopsy and  possible dilation were discussed with the patient and they consent to proceed. The procedure will be scheduled electively. Orders: EGD (EGD)  Patient Instructions: 1)  Upper Endoscopy brochure given.  2)  Avoid foods high in acid content ( tomatoes, citrus juices, spicy foods) . Avoid eating within 3 to 4 hours of lying down or before exercising. Do not over eat; try smaller more frequent meals. Elevate head of bed four inches when sleeping.  3)  Copy sent to : [name], MD 4)  The medication list was reviewed and reconciled.  All changed / newly prescribed medications were explained.  A complete medication list was provided to the patient / caregiver.

## 2011-01-13 NOTE — Progress Notes (Signed)
Summary: lmtcb  Phone Note Outgoing Call   Call placed by: [name], [facility]),  July 22, 2010 1:29 PM Summary of Call: left mess for pt to call back to see if she needs some Diovan samples     Appended Document: lmtcb Gave samples x 2 wks to patient

## 2011-01-13 NOTE — Letter (Signed)
Summary: Patient [facility] Biopsy Results   Gastroenterology  [address] [place], [place] [address]   Phone: [phone]  Fax: [phone]        February 05, 2010 MRN: [id number]    [name] [name] [address] [place], [place]  [address]    Dear Ms. Tapia,  I am pleased to inform you that the biopsies taken during your recent endoscopic examination did not show any evidence of cancer upon pathologic examination. The biopsies showed chronic gastritis with H. pylori infection. Please complete all of the antibiotics prescribed.  Continue with the treatment plan as outlined on the day of your      exam.  Please call us if you are having persistent problems or have questions about your condition that have not been fully answered at this time.  Sincerely,  Meryl Dare MD [facility]  This letter has been electronically signed by your physician.  Appended Document: Patient Notice-Endo Biopsy Results Letter mailed 2.25.11

## 2011-01-13 NOTE — Progress Notes (Signed)
Summary: Condition/results  Phone Note Call from Patient Call back at [facility] Phone [phone]   Summary of Call: FYI--Patient called back to inquire about her condition and her partner. I notified that patient that sexual partners do not necessarily need to be treated, but she needs to sustain sexual activity because vaginosis can make her more succeptible to other STD's. I also advised patient of the suspected causes, and recommended that if it becomes a recurring issue to see her GYN MD.  **Also made patient aware other STD test were negative, but vitamin D was just a little low. Initial call taken by: Lucious Groves,  December 25, 2009 2:56 PM

## 2011-01-13 NOTE — Assessment & Plan Note (Signed)
Summary: BP 145/105---HEADACHE-DR AVP PT/NO PM CLINIC--[facility]C   Vital Signs:  Patient profile:   40 year old female Height:      64 inches Weight:      228 pounds BMI:     39.28 O2 Sat:      98 % on Room air Temp:     98.2 degrees F oral Pulse rate:   83 / minute Pulse rhythm:   regular Resp:     16 per minute BP sitting:   110 / 70  (left arm) Cuff size:   large  Vitals Entered By: Rock Nephew CMA (July 10, 2010 3:21 PM)  Nutrition Counseling: Patient's BMI is greater than 25 and therefore counseled on weight management options.  O2 Flow:  Room air CC: Headache, Headaches, Abdominal Pain Is Patient Diabetic? No Pain Assessment Patient in pain? no        Primary Care Provider:  [name] MD  CC:  Headache, Headaches, and Abdominal Pain.  History of Present Illness:  Headaches      This is a 40 year old woman who presents with Headaches.  The symptoms began 5 days ago.  On a scale of 1 to 10, the intensity is described as a 3.  The patient denies nausea, vomiting, sweats, tearing of eyes, nasal congestion, sinus pain, sinus pressure, photophobia, and phonophobia.  The headache is described as constant and throbbing.  The location of the pain is unilateral on the right.  The patient denies the following high-risk features: fever, neck pain/stiffness, vision loss or change, focal weakness, altered mental status, rash, trauma, pain worse with exertion, new type of headache, age >50 years, immunosuppression, concomitant infection, and anticoagulation use.  The headaches are precipitated by stress.  Prior treatment has included a NSAID and acetaminophen.    Dyspepsia History:      She has no alarm features of dyspepsia including no history of melena, hematochezia, dysphagia, persistent vomiting, or involuntary weight loss > 5%.  There is a prior history of GERD.  The patient does not have a prior history of documented ulcer disease.  The dominant symptom is heartburn or  acid reflux.  An H-2 blocker medication is currently being taken.  She notes that the symptoms have improved with the H-2 blocker therapy.  Symptoms have not persisted after 4 weeks of H-2 blocker treatment.  A prior EGD has been done.     Preventive Screening-Counseling & Management  Alcohol-Tobacco     Alcohol drinks/day: 0     Smoking Status: never  Caffeine-Diet-Exercise     Does Patient Exercise: yes  Hep-HIV-STD-Contraception     Hepatitis Risk: no risk noted     HIV Risk: no risk noted     STD Risk: no risk noted      Sexual History:  currently monogamous.        Drug Use:  no.        Blood Transfusions:  no.    Medications Prior to Update: 1)  Diovan Hct 160-25 Mg  Tabs (Valsartan-Hydrochlorothiazide) .Marland Kitchen.. 1 Once Daily 2)  Vitamin D3 1000 Unit  Tabs (Cholecalciferol) .Marland Kitchen.. 1 By Mouth Daily 3)  Meloxicam 15 Mg Tabs (Meloxicam) .... 1/2 or One By Mouth Daily Pc X 1 Wk Then Prn 4)  Dexilant 60 Mg Cpdr (Dexlansoprazole) .Marland Kitchen.. 1 By Mouth Q Am For Indigestion 5)  Triamcinolone Acetonide 0.5 % Crea (Triamcinolone Acetonide) .... Use Two Times A Day Prn 6)  Pylera 140-125-125 Mg Caps (Bis Subcit-Metronid-Tetracyc) .Marland KitchenMarland KitchenMarland Kitchen  3 By Mouth Qid  Current Medications (verified): 1)  Diovan Hct 160-25 Mg  Tabs (Valsartan-Hydrochlorothiazide) .Marland Kitchen.. 1 Once Daily 2)  Vitamin D3 1000 Unit  Tabs (Cholecalciferol) .Marland Kitchen.. 1 By Mouth Daily 3)  Meloxicam 15 Mg Tabs (Meloxicam) .... 1/2 or One By Mouth Daily Pc X 1 Wk Then Prn 4)  Dexilant 60 Mg Cpdr (Dexlansoprazole) .Marland Kitchen.. 1 By Mouth Q Am For Indigestion 5)  Triamcinolone Acetonide 0.5 % Crea (Triamcinolone Acetonide) .... Use Two Times A Day Prn 6)  Maxalt 10 Mg Tabs (Rizatriptan Benzoate) .... Take One As Neede For Headache  Allergies (verified): 1)  ! Macrobid 2)  Cipro  Past History:  Past Medical History: Last updated: 03/11/2009 HA, chronic Dr Vela Prose, neg MRI 2005 Anemia-iron deficiency Hypertension GERD  Past Surgical History: Last  updated: 01/20/2010 Hysterectomy 2010 partial Nasal surgery Breast reduction surgery cyst removed from vagina abortion x 4 1990-1994 Tubal Ligation  Family History: Last updated: 12/23/2009 Family History Hypertension M esoph CA at 40  Social History: Last updated: 07/10/2010 Occupation: Single 2 kids, has a boyfriend Never Smoked Alcohol use-no Drug use-no Regular exercise-yes  Risk Factors: Alcohol Use: 0 (07/10/2010) Exercise: yes (07/10/2010)  Risk Factors: Smoking Status: never (07/10/2010)  Family History: Reviewed history from 12/23/2009 and no changes required. Family History Hypertension M esoph CA at 41  Social History: Reviewed history from 12/23/2009 and no changes required. Occupation: Single 2 kids, has a boyfriend Never Smoked Alcohol use-no Drug use-no Regular exercise-yes Hepatitis Risk:  no risk noted HIV Risk:  no risk noted STD Risk:  no risk noted Sexual History:  currently monogamous Blood Transfusions:  no Drug Use:  no Does Patient Exercise:  yes  Review of Systems  The patient denies anorexia, fever, chest pain, syncope, abdominal pain, suspicious skin lesions, difficulty walking, depression, melena, hematochezia, and severe indigestion/heartburn.   Neuro:  Complains of headaches; denies brief paralysis, difficulty with concentration, disturbances in coordination, falling down, numbness, poor balance, seizures, sensation of room spinning, tingling, tremors, visual disturbances, and weakness. Heme:  Denies abnormal bruising, bleeding, enlarge lymph nodes, fevers, pallor, and skin discoloration.  Physical Exam  General:  alert, well-developed, well-nourished, well-hydrated, appropriate dress, normal appearance, healthy-appearing, cooperative to examination, and overweight-appearing.   Head:  normocephalic, atraumatic, no abnormalities observed, no abnormalities palpated, and no alopecia.   Eyes:  vision grossly intact, pupils equal,  pupils round, pupils reactive to light, pupils react to accomodation, no injection, no optic disk abnormalities, and no nystagmus.   Ears:  R ear normal and L ear normal.   Nose:  External nasal examination shows no deformity or inflammation. Nasal mucosa are pink and moist without lesions or exudates. Mouth:  Oral mucosa and oropharynx without lesions or exudates.  Teeth in good repair. Neck:  supple, full ROM, no masses, no thyromegaly, no thyroid nodules or tenderness, normal carotid upstroke, no cervical lymphadenopathy, and no neck tenderness.   Lungs:  normal respiratory effort, no intercostal retractions, no accessory muscle use, normal breath sounds, no dullness, no fremitus, no crackles, and no wheezes.   Heart:  normal rate, regular rhythm, no murmur, no gallop, no rub, and no JVD.   Abdomen:  soft, non-tender, normal bowel sounds, no distention, no masses, no guarding, no rigidity, no rebound tenderness, no abdominal hernia, no inguinal hernia, no hepatomegaly, and no splenomegaly.   Msk:  normal ROM, no joint tenderness, no joint swelling, no joint warmth, no redness over joints, no joint deformities, no joint instability, and no  crepitation.   Pulses:  R and L carotid,radial,femoral,dorsalis pedis and posterior tibial pulses are full and equal bilaterally Extremities:  No clubbing, cyanosis, edema, or deformity noted with normal full range of motion of all joints.   Neurologic:  alert & oriented X3, cranial nerves II-XII intact, strength normal in all extremities, sensation intact to light touch, sensation intact to pinprick, gait normal, DTRs symmetrical and normal, finger-to-nose normal, heel-to-shin normal, toes down bilaterally on Babinski, and Romberg negative.   Skin:  Intact without suspicious lesions or rashes Cervical Nodes:  No lymphadenopathy noted Axillary Nodes:  No palpable lymphadenopathy Psych:  Cognition and judgment appear intact. Alert and cooperative with normal  attention span and concentration. No apparent delusions, illusions, hallucinations   Impression & Recommendations:  Problem # 1:  HEADACHE (ICD-784.0) Assessment Unchanged this sounds like a migraine-I will look at labs for inflammation, etc. Her updated medication list for this problem includes:    Meloxicam 15 Mg Tabs (Meloxicam) .Marland Kitchen... 1/2 or one by mouth daily pc x 1 wk then prn    Maxalt 10 Mg Tabs (Rizatriptan benzoate) .Marland Kitchen... Take one as neede for headache  Orders: Venipuncture (81191) TLB-BMP (Basic Metabolic Panel-BMET) (80048-METABOL) TLB-CBC Platelet - w/Differential (85025-CBCD) TLB-Hepatic/Liver Function Pnl (80076-HEPATIC) TLB-TSH (Thyroid Stimulating Hormone) (84443-TSH) TLB-Sedimentation Rate (ESR) (85652-ESR)  Problem # 2:  HYPERTENSION (ICD-401.9) Assessment: Improved  Her updated medication list for this problem includes:    Diovan Hct 160-25 Mg Tabs (Valsartan-hydrochlorothiazide) .Marland Kitchen... 1 once daily  Orders: Venipuncture (47829) TLB-BMP (Basic Metabolic Panel-BMET) (80048-METABOL) TLB-CBC Platelet - w/Differential (85025-CBCD) TLB-Hepatic/Liver Function Pnl (80076-HEPATIC) TLB-TSH (Thyroid Stimulating Hormone) (84443-TSH) TLB-Sedimentation Rate (ESR) (85652-ESR)  BP today: 110/70 Prior BP: 120/80 (01/20/2010)  Labs Reviewed: K+: 4.2 (12/20/2009) Creat: : 0.8 (12/20/2009)   Chol: 192 (12/23/2009)   HDL: 50.60 (12/23/2009)   LDL: 127 (12/23/2009)   TG: 73.0 (12/23/2009)  Problem # 3:  ANEMIA-IRON DEFICIENCY (ICD-280.9) Assessment: Unchanged  Orders: Venipuncture (56213) TLB-BMP (Basic Metabolic Panel-BMET) (80048-METABOL) TLB-CBC Platelet - w/Differential (85025-CBCD) TLB-Hepatic/Liver Function Pnl (80076-HEPATIC) TLB-TSH (Thyroid Stimulating Hormone) (84443-TSH) TLB-Sedimentation Rate (ESR) (85652-ESR)  Hgb: 12.7 (12/20/2009)   Hct: 39.4 (12/20/2009)   Platelets: 331.0 (12/20/2009) RBC: 4.53 (12/20/2009)   RDW: 16.6 (12/20/2009)   WBC: 10.3  (12/20/2009) MCV: 87.0 (12/20/2009)   MCHC: 32.2 (12/20/2009) Iron: 52 (12/20/2009)   % Sat: 14.3 (12/20/2009) B12: 378 (10/19/2007)   Folate: 11.0 (10/19/2007)   TSH: 1.32 (12/23/2009)  Problem # 4:  GERD (ICD-530.81) Assessment: Improved  The following medications were removed from the medication list:    Pylera 140-125-125 Mg Caps (Bis subcit-metronid-tetracyc) .Marland KitchenMarland KitchenMarland KitchenMarland Kitchen 3 by mouth qid Her updated medication list for this problem includes:    Dexilant 60 Mg Cpdr (Dexlansoprazole) .Marland Kitchen... 1 by mouth q am for indigestion  EGD: DONE (02/03/2010)  Labs Reviewed: Hgb: 12.7 (12/20/2009)   Hct: 39.4 (12/20/2009)  Complete Medication List: 1)  Diovan Hct 160-25 Mg Tabs (Valsartan-hydrochlorothiazide) .Marland Kitchen.. 1 once daily 2)  Vitamin D3 1000 Unit Tabs (Cholecalciferol) .Marland Kitchen.. 1 by mouth daily 3)  Meloxicam 15 Mg Tabs (Meloxicam) .... 1/2 or one by mouth daily pc x 1 wk then prn 4)  Dexilant 60 Mg Cpdr (Dexlansoprazole) .Marland Kitchen.. 1 by mouth q am for indigestion 5)  Triamcinolone Acetonide 0.5 % Crea (Triamcinolone acetonide) .... Use two times a day prn 6)  Maxalt 10 Mg Tabs (Rizatriptan benzoate) .... Take one as neede for headache  Patient Instructions: 1)  Please schedule a follow-up appointment in 2 weeks. 2)  It is important that  you exercise regularly at least 20 minutes 5 times a week. If you develop chest pain, have severe difficulty breathing, or feel very tired , stop exercising immediately and seek medical attention. 3)  You need to lose weight. Consider a lower calorie diet and regular exercise.  4)  Check your Blood Pressure regularly. If it is above: you should make an appointment. Prescriptions: MAXALT 10 MG TABS (RIZATRIPTAN BENZOATE) Take one as neede for headache  #2 x 0   Entered and Authorized by:   Etta Grandchild MD   Signed by:   Etta Grandchild MD on 07/10/2010   Method used:   Samples Given   RxID:   [id number]

## 2011-01-13 NOTE — Assessment & Plan Note (Signed)
Summary: 6 MO ROV /NWS $50   Vital Signs:  Patient profile:   40 year old female Weight:      223 pounds BMI:     38.42 Temp:     96.2 degrees F oral Pulse rate:   64 / minute BP sitting:   114 / 88  (left arm)  Vitals Entered By: Tora Perches (December 23, 2009 10:32 AM) CC: f/u Is Patient Diabetic? No   CC:  f/u.  History of Present Illness: The patient presents for a wellness examination  andPAP F/u HTN, GERD, OA Wants to be checked for STDs C/o dry skin on nipples  Preventive Screening-Counseling & Management  Alcohol-Tobacco     Smoking Status: never  Current Medications (verified): 1)  Diovan Hct 160-25 Mg  Tabs (Valsartan-Hydrochlorothiazide) .Marland Kitchen.. 1 Once Daily 2)  Vitamin D3 1000 Unit  Tabs (Cholecalciferol) .Marland Kitchen.. 1 By Mouth Daily 3)  Kapidex 60 Mg Cpdr (Dexlansoprazole) .Marland Kitchen.. 1 By Mouth Q Am 4)  Meloxicam 15 Mg Tabs (Meloxicam) .... 1/2 or One By Mouth Daily Pc X 1 Wk Then Prn  Allergies: 1)  ! Macrobid 2)  Cipro  Past History:  Past Medical History: Last updated: 03/11/2009 HA, chronic Dr Vela Prose, neg MRI 2005 Anemia-iron deficiency Hypertension GERD  Past Surgical History: Last updated: 03/11/2009 Hysterectomy 2010 partial  Family History: Family History Hypertension M esoph CA at 45  Social History: Occupation: Single 2 kids, has a boyfriend  Review of Systems  The patient denies anorexia, fever, weight loss, weight gain, vision loss, decreased hearing, hoarseness, chest pain, syncope, dyspnea on exertion, peripheral edema, prolonged cough, headaches, hemoptysis, abdominal pain, melena, hematochezia, severe indigestion/heartburn, hematuria, incontinence, genital sores, muscle weakness, suspicious skin lesions, transient blindness, difficulty walking, depression, unusual weight change, abnormal bleeding, enlarged lymph nodes, angioedema, and breast masses.         Vag. d/c and groin rash  Physical Exam  General:  overweight-appearing.     Head:  Normocephalic and atraumatic without obvious abnormalities. No apparent alopecia or balding. Eyes:  No corneal or conjunctival inflammation noted. EOMI. Perrla Nose:  External nasal examination shows no deformity or inflammation. Nasal mucosa are pink and moist without lesions or exudates. Mouth:  Oral mucosa and oropharynx without lesions or exudates.  Teeth in good repair. Neck:  No deformities, masses, or tenderness noted. Lungs:  CTA Heart:  RRR Abdomen:  Bowel sounds positive,abdomen soft and non-tender without masses, organomegaly or hernias noted. Genitalia:  normal introitus, mucosa pink and moist, no adnexal masses or tenderness, and vaginal discharge.  No uterus. PAP obtained Msk:  B hands NT Neurologic:  No cranial nerve deficits noted. Station and gait are normal. Plantar reflexes are down-going bilaterally. DTRs are symmetrical throughout. Sensory, motor and coordinative functions appear intact. Skin:  Intact without suspicious lesions  intertrigo Cervical Nodes:  No lymphadenopathy noted Inguinal Nodes:  No significant adenopathy Psych:  Cognition and judgment appear intact. Alert and cooperative with normal attention span and concentration. No apparent delusions, illusions, hallucinations   Impression & Recommendations:  Problem # 1:  PHYSICAL EXAMINATION (ICD-V70.0) Assessment New  Orders: T-Vitamin D (25-Hydroxy) ([address]-60454) TLB-TSH (Thyroid Stimulating Hormone) (84443-TSH) TLB-Hepatic/Liver Function Pnl (80076-HEPATIC) TLB-Lipid Panel (80061-LIPID)  Problem # 2:  VAGINITIS (ICD-616.10) Assessment: New  Her updated medication list for this problem includes:    Metronidazole 250 Mg Tabs (Metronidazole) .Marland Kitchen... 1 by mouth tid  Orders: T-Chlamydia & GC Probe, Genital (87491/[address]) T-HIV Antibody  (Reflex) [id number]) TLB-Wet [place] / Fungus (87210-WPREP)  Problem # 3:  GERD (ICD-530.81) Assessment: Comment Only  The following medications were  removed from the medication list:    Kapidex 60 Mg Cpdr (Dexlansoprazole) .Marland Kitchen... 1 by mouth q am Her updated medication list for this problem includes:    Dexilant 60 Mg Cpdr (Dexlansoprazole) .Marland Kitchen... 1 by mouth q am for indigestion  Orders: Gastroenterology Referral (GI)  Complete Medication List: 1)  Diovan Hct 160-25 Mg Tabs (Valsartan-hydrochlorothiazide) .Marland Kitchen.. 1 once daily 2)  Vitamin D3 1000 Unit Tabs (Cholecalciferol) .Marland Kitchen.. 1 by mouth daily 3)  Meloxicam 15 Mg Tabs (Meloxicam) .... 1/2 or one by mouth daily pc x 1 wk then prn 4)  Dexilant 60 Mg Cpdr (Dexlansoprazole) .Marland Kitchen.. 1 by mouth q am for indigestion 5)  Diflucan 100 Mg Tabs (Fluconazole) .... Take two tablets on the first day, than  1 by mouth once daily untill gone for a fungul infection 6)  Triamcinolone Acetonide 0.5 % Crea (Triamcinolone acetonide) .... Use two times a day prn 7)  Metronidazole 250 Mg Tabs (Metronidazole) .Marland Kitchen.. 1 by mouth tid  Patient Instructions: 1)  Please schedule a follow-up appointment in 1 year well w/labs. Prescriptions: METRONIDAZOLE 250 MG TABS (METRONIDAZOLE) 1 by mouth tid  #21 x 0   Entered and Authorized by:   Tresa Garter MD   Signed by:   Tresa Garter MD on 12/23/2009   Method used:   Electronically to        [facility] [address] [address] (retail)       [address]       [place], [place]  [id number]       Ph: [phone]       Fax: [phone]   RxID:   [id number] TRIAMCINOLONE ACETONIDE 0.5 % CREA (TRIAMCINOLONE ACETONIDE) use two times a day prn  #60 g x 3   Entered and Authorized by:   Tresa Garter MD   Signed by:   Tresa Garter MD on 12/23/2009   Method used:   Print then Give to Patient   RxID:   [id number] DIFLUCAN 100 MG TABS (FLUCONAZOLE) Take two tablets on the first day, than  1 by mouth once daily untill gone for a fungul infection  #11 x 1   Entered and Authorized by:   Tresa Garter MD   Signed by:   Tresa Garter  MD on 12/23/2009   Method used:   Print then Give to Patient   RxID:   [id number] MELOXICAM 15 MG TABS (MELOXICAM) 1/2 or one by mouth daily pc x 1 wk then prn  #30 x 4   Entered and Authorized by:   Tresa Garter MD   Signed by:   Tresa Garter MD on 12/23/2009   Method used:   Print then Give to Patient   RxID:   [id number] DEXILANT 60 MG CPDR (DEXLANSOPRAZOLE) 1 by mouth q am for indigestion  #90 x 3   Entered and Authorized by:   Tresa Garter MD   Signed by:   Tresa Garter MD on 12/23/2009   Method used:   Print then Give to Patient   RxID:   [id number]

## 2011-01-13 NOTE — Progress Notes (Signed)
Summary: REQ FOR RX  Phone Note Call from Patient   Summary of Call: Pt c/o urinary burning and is req rx. Would you like u/a or office visit?  Initial call taken by: Lamar Sprinkles, CMA,  January 07, 2010 12:59 PM  Follow-up for Phone Call        UA pls Follow-up by: Tresa Garter MD,  January 07, 2010 1:12 PM  Additional Follow-up for Phone Call Additional follow up Details #1::        Patient notified and will come in for U/A. Lab entered in IDX and holding phone note for results and advisement Additional Follow-up by: Rock Nephew CMA,  January 07, 2010 1:22 PM    Additional Follow-up for Phone Call Additional follow up Details #2::    Labs ready, please advise.......................Marland KitchenLamar Sprinkles, CMA  January 07, 2010 5:04 PM   UTI. Take Ceftin Follow-up by: Tresa Garter MD,  January 07, 2010 5:46 PM  Additional Follow-up for Phone Call Additional follow up Details #3:: Details for Additional Follow-up Action Taken: Pt informed  Additional Follow-up by: Lamar Sprinkles, CMA,  January 07, 2010 5:56 PM  New/Updated Medications: CEFTIN 250 MG TABS (CEFUROXIME AXETIL) 1 by mouth two times a day for bladder infection Prescriptions: CEFTIN 250 MG TABS (CEFUROXIME AXETIL) 1 by mouth two times a day for bladder infection  #10 x 2   Entered and Authorized by:   Tresa Garter MD   Signed by:   Lamar Sprinkles, CMA on 01/07/2010   Method used:   Electronically to        [facility] (retail)       [address]       [place], [place]  [id number]       Ph: [phone]       Fax: [phone]   RxID:   [id number]

## 2011-01-13 NOTE — Letter (Signed)
Summary: Results Follow-up Letter  [facility]  [address] [place], [place] [address]   Phone: [phone]  Fax: [phone]    07/11/2010  [address] [place], [place]  [address]  Dear Ms. Klunder,   The following are the results of your recent test(s):  Test     Result     CBC       slightly elevated WBC Blood sugar     slightly elevated Liver/kidney   normal Thyroid     normal   _________________________________________________________  Please call for an appointment soon for a follow-up on your blood sugar _________________________________________________________ _________________________________________________________ _________________________________________________________  Sincerely,  Sanda Linger MD [facility]

## 2011-01-13 NOTE — Assessment & Plan Note (Signed)
Summary: 2 WK FU---STC   Vital Signs:  Patient profile:   40 year old female Height:      64 inches Weight:      235 pounds BMI:     40.48 O2 Sat:      97 % on Room air Temp:     98.3 degrees F oral Pulse rate:   67 / minute Pulse rhythm:   regular Resp:     16 per minute BP sitting:   150 / 80  (right arm) Cuff size:   large  Vitals Entered By: [name], CMA(AAMA) (July 28, 2010 8:33 AM)  O2 Flow:  Room air CC: 2 wk f/u Comments pt is not taking Maxalt.  Please remove from list   Primary Care Provider:  [name] MD  CC:  2 wk f/u.  History of Present Illness: C/o HA, HTN - ran out of Diovan C/o insomnia x 2 months - sleeping 4 hrs or so Denies being depressed C/o dandruff  Current Medications (verified): 1)  Diovan Hct 160-25 Mg  Tabs (Valsartan-Hydrochlorothiazide) .Marland Kitchen.. 1 Once Daily 2)  Vitamin D3 1000 Unit  Tabs (Cholecalciferol) .Marland Kitchen.. 1 By Mouth Daily 3)  Meloxicam 15 Mg Tabs (Meloxicam) .... 1/2 or One By Mouth Daily Pc X 1 Wk Then Prn 4)  Dexilant 60 Mg Cpdr (Dexlansoprazole) .Marland Kitchen.. 1 By Mouth Q Am For Indigestion 5)  Triamcinolone Acetonide 0.5 % Crea (Triamcinolone Acetonide) .... Use Two Times A Day Prn 6)  Maxalt 10 Mg Tabs (Rizatriptan Benzoate) .... Take One As Neede For Headache 7)  Fioricet 50-325-40 Mg Tabs (Butalbital-Apap-Caffeine) .Marland Kitchen.. 1-2 By Mouth Two Times A Day As Needed Migraine or Tension Ha  Allergies (verified): 1)  ! Macrobid 2)  Cipro  Past History:  Past Surgical History: Last updated: 01/20/2010 Hysterectomy 2010 partial Nasal surgery Breast reduction surgery cyst removed from vagina abortion x 4 1990-1994 Tubal Ligation  Family History: Last updated: 12/23/2009 Family History Hypertension M esoph CA at 54  Social History: Last updated: 07/10/2010 Occupation: Single 2 kids, has a boyfriend Never Smoked Alcohol use-no Drug use-no Regular exercise-yes  Past Medical History: HA, chronic Dr Vela Prose, neg MRI  2005 Anemia-iron deficiency Hypertension GERD Elev glu 2011  Review of Systems  The patient denies decreased hearing, dyspnea on exertion, peripheral edema, and abdominal pain.    Physical Exam  General:  alert, well-developed, well-nourished,  overweight-appearing.   Head:  Normocephalic and atraumatic without obvious abnormalities. No apparent alopecia or balding. Eyes:  vision grossly intact, pupils equal, pupils round, pupils reactive to light, pupils react to accomodation, no injection, no optic disk abnormalities, and no nystagmus.   Ears:  R ear normal and L ear normal.   Nose:  External nasal examination shows no deformity or inflammation. Nasal mucosa are pink and moist without lesions or exudates. Mouth:  Oral mucosa and oropharynx without lesions or exudates.  Teeth in good repair. Neck:  supple, full ROM, no masses, no thyromegaly, no thyroid nodules or tenderness, normal carotid upstroke, no cervical lymphadenopathy, and no neck tenderness.   Lungs:  normal respiratory effort, no intercostal retractions, no accessory muscle use, normal breath sounds, no dullness, no fremitus, no crackles, and no wheezes.   Heart:  normal rate, regular rhythm, no murmur, no gallop, no rub, and no JVD.   Abdomen:  soft, non-tender, normal bowel sounds, no distention, no masses, no guarding, no rigidity, no rebound tenderness, no abdominal hernia, no inguinal hernia, no hepatomegaly, and no splenomegaly.  Msk:  normal ROM, no joint tenderness, no joint swelling, no joint warmth, no redness over joints, no joint deformities, no joint instability, and no crepitation.   Neurologic:  alert & oriented X3, cranial nerves II-XII intact, strength normal in all extremities, sensation intact to light touch, sensation intact to pinprick, gait normal, DTRs symmetrical and normal, finger-to-nose normal, heel-to-shin normal, toes down bilaterally on Babinski, and Romberg negative.   Skin:  Intact without  suspicious lesions or rashes Scaly scalp Psych:  Cognition and judgment appear intact. Alert and cooperative with normal attention span and concentration. No apparent delusions, illusions, hallucinations   Impression & Recommendations:  Problem # 1:  HYPERTENSION (ICD-401.9) Assessment Deteriorated Risks of noncompliance with treatment discussed. Compliance encouraged.  Her updated medication list for this problem includes:    Diovan Hct 160-25 Mg Tabs (Valsartan-hydrochlorothiazide) .Marland Kitchen... 1 once daily - restart!  Problem # 2:  INSOMNIA, CHRONIC (ICD-307.42) Assessment: New Trial ov Zolpidem Risks vs benefits and controversies of a long term sleep meds use were discussed.   Problem # 3:  HEADACHE (ICD-784.0) aggravated by #1-2 Assessment: Unchanged  Her updated medication list for this problem includes:    Meloxicam 15 Mg Tabs (Meloxicam) .Marland Kitchen... 1/2 or one by mouth daily pc x 1 wk then prn    Maxalt 10 Mg Tabs (Rizatriptan benzoate) .Marland Kitchen... Take one as neede for headache    Fioricet 50-325-40 Mg Tabs (Butalbital-apap-caffeine) .Marland Kitchen... 1-2 by mouth two times a day as needed migraine or tension ha  Problem # 4:  ANEMIA-IRON DEFICIENCY (ICD-280.9) Assessment: Comment Only On the regimen of medicine(s) reflected in the chart    Problem # 5:  HYPERGLYCEMIA (ICD-790.29) Assessment: New Loose wt  Problem # 6:  DANDRUFF (ICD-690.18) Assessment: Deteriorated Ketocon shampoo Rx  Complete Medication List: 1)  Diovan Hct 160-25 Mg Tabs (Valsartan-hydrochlorothiazide) .Marland Kitchen.. 1 once daily 2)  Vitamin D3 1000 Unit Tabs (Cholecalciferol) .Marland Kitchen.. 1 by mouth daily 3)  Meloxicam 15 Mg Tabs (Meloxicam) .... 1/2 or one by mouth daily pc x 1 wk then prn 4)  Dexilant 60 Mg Cpdr (Dexlansoprazole) .Marland Kitchen.. 1 by mouth q am for indigestion 5)  Triamcinolone Acetonide 0.5 % Crea (Triamcinolone acetonide) .... Use two times a day prn 6)  Maxalt 10 Mg Tabs (Rizatriptan benzoate) .... Take one as neede for  headache 7)  Fioricet 50-325-40 Mg Tabs (Butalbital-apap-caffeine) .Marland Kitchen.. 1-2 by mouth two times a day as needed migraine or tension ha 8)  Zolpidem Tartrate 10 Mg Tabs (Zolpidem tartrate) .... 1/2-1 tab at bedtime as needed insomnia 9)  Ketoconazole 2 % Sham (Ketoconazole) .... Use 2 times a week as dirrected for dandruff  Patient Instructions: 1)  It is important that you exercise regularly at least 20 minutes 5 times a week. If you develop chest pain, have severe difficulty breathing, or feel very tired , stop exercising immediately and seek medical attention. 2)  You need to lose weight. Consider a lower calorie diet and regular exercise.  3)  Please schedule a follow-up appointment in 3 months well w/labs and A1c 790.29. Prescriptions: KETOCONAZOLE 2 % SHAM (KETOCONAZOLE) use 2 times a week as dirrected for dandruff  #1 x 3   Entered and Authorized by:   Tresa Garter MD   Signed by:   Tresa Garter MD on 07/28/2010   Method used:   Print then Give to Patient   RxID:   [id number] ZOLPIDEM TARTRATE 10 MG TABS (ZOLPIDEM TARTRATE) 1/2-1 tab at bedtime as needed insomnia  #  30 x 3   Entered and Authorized by:   Tresa Garter MD   Signed by:   Tresa Garter MD on 07/28/2010   Method used:   Print then Give to Patient   RxID:   [id number]

## 2011-01-13 NOTE — Progress Notes (Signed)
Summary: REFERRAL  Phone Note Call from Patient Call back at Home Phone [phone]   Summary of Call: Patient is requesting referral to h/a specialist in [place]. C/o no relief from meds given. Also has had no relief from generic ambien.  Initial call taken by: Lamar Sprinkles, CMA,  August 07, 2010 3:25 PM  Follow-up for Phone Call        ok HA spec -does she know who she would like to see? Follow-up by: Tresa Garter MD,  August 07, 2010 6:49 PM  Additional Follow-up for Phone Call Additional follow up Details #1::        Pt does not have anyone specific in mind. Please put in referral to whomever you suggest.  Additional Follow-up by: Lamar Sprinkles, CMA,  August 08, 2010 9:10 AM    Additional Follow-up for Phone Call Additional follow up Details #2::    ok Follow-up by: Tresa Garter MD,  August 08, 2010 9:46 AM

## 2011-01-20 ENCOUNTER — Other Ambulatory Visit: Payer: Self-pay | Admitting: Internal Medicine

## 2011-01-20 DIAGNOSIS — Z1231 Encounter for screening mammogram for malignant neoplasm of breast: Secondary | ICD-10-CM

## 2011-02-02 ENCOUNTER — Ambulatory Visit
Admission: RE | Admit: 2011-02-02 | Discharge: 2011-02-02 | Disposition: A | Discharge status: Home / Self Care | Payer: 59 | Source: Ambulatory Visit | Attending: Internal Medicine | Admitting: Internal Medicine

## 2011-02-02 DIAGNOSIS — Z1231 Encounter for screening mammogram for malignant neoplasm of breast: Secondary | ICD-10-CM

## 2011-02-18 ENCOUNTER — Encounter: Payer: Self-pay | Admitting: Internal Medicine

## 2011-02-24 NOTE — Miscellaneous (Signed)
Summary: mammogram 2012  Clinical Lists Changes  Observations: Added new observation of MAMMOGRAM: normal (02/02/2011 9:18)      Preventive Care Screening  Mammogram:    Date:  02/02/2011    Results:  normal

## 2011-03-02 ENCOUNTER — Other Ambulatory Visit: Payer: Self-pay

## 2011-03-04 ENCOUNTER — Telehealth: Payer: Self-pay | Admitting: Internal Medicine

## 2011-03-04 ENCOUNTER — Other Ambulatory Visit: Payer: Self-pay

## 2011-03-04 NOTE — Telephone Encounter (Signed)
Pt states that she has labs tomorrow and wants to know if you can add U/A. Pt thinks she has a UTI.

## 2011-03-04 NOTE — Telephone Encounter (Signed)
Okay urinalysis thank you    Diagnoses cystitis

## 2011-03-04 NOTE — Telephone Encounter (Signed)
Labs added, pt aware

## 2011-03-05 ENCOUNTER — Ambulatory Visit: Payer: 59

## 2011-03-05 DIAGNOSIS — N3000 Acute cystitis without hematuria: Secondary | ICD-10-CM

## 2011-03-05 DIAGNOSIS — R7309 Other abnormal glucose: Secondary | ICD-10-CM

## 2011-03-05 LAB — URINALYSIS, ROUTINE W REFLEX MICROSCOPIC
Nitrite: NEGATIVE
Specific Gravity, Urine: 1.02 (ref 1.000–1.030)
Total Protein, Urine: 30
Urine Glucose: NEGATIVE
Urobilinogen, UA: 0.2 (ref 0.0–1.0)

## 2011-03-05 LAB — BASIC METABOLIC PANEL
CO2: 27 mEq/L (ref 19–32)
Chloride: 98 mEq/L (ref 96–112)
Sodium: 133 mEq/L — ABNORMAL LOW (ref 135–145)

## 2011-03-05 LAB — HEMOGLOBIN A1C: Hgb A1c MFr Bld: 7.2 % — ABNORMAL HIGH (ref 4.6–6.5)

## 2011-03-06 ENCOUNTER — Ambulatory Visit (INDEPENDENT_AMBULATORY_CARE_PROVIDER_SITE_OTHER): Payer: 59 | Admitting: Internal Medicine

## 2011-03-06 ENCOUNTER — Encounter: Payer: Self-pay | Admitting: Internal Medicine

## 2011-03-06 VITALS — BP 124/88 | HR 88 | Temp 98.5°F | Resp 16 | Ht 63.0 in | Wt 235.0 lb

## 2011-03-06 DIAGNOSIS — E559 Vitamin D deficiency, unspecified: Secondary | ICD-10-CM

## 2011-03-06 DIAGNOSIS — D509 Iron deficiency anemia, unspecified: Secondary | ICD-10-CM

## 2011-03-06 DIAGNOSIS — E669 Obesity, unspecified: Secondary | ICD-10-CM

## 2011-03-06 DIAGNOSIS — N3090 Cystitis, unspecified without hematuria: Secondary | ICD-10-CM

## 2011-03-06 DIAGNOSIS — E119 Type 2 diabetes mellitus without complications: Secondary | ICD-10-CM | POA: Insufficient documentation

## 2011-03-06 DIAGNOSIS — E1165 Type 2 diabetes mellitus with hyperglycemia: Secondary | ICD-10-CM | POA: Insufficient documentation

## 2011-03-06 DIAGNOSIS — N3000 Acute cystitis without hematuria: Secondary | ICD-10-CM

## 2011-03-06 LAB — GLUCOSE, POCT (MANUAL RESULT ENTRY): POC Glucose: 150

## 2011-03-06 MED ORDER — CEFACLOR 250 MG PO CAPS: 250.0000 mg | ORAL_CAPSULE | Freq: Three times a day (TID) | ORAL | Status: AC

## 2011-03-06 MED ORDER — METFORMIN HCL 500 MG PO TABS: 500.0000 mg | ORAL_TABLET | Freq: Two times a day (BID) | ORAL | Status: DC

## 2011-03-06 NOTE — Patient Instructions (Signed)
Return in 3 months with labs prior. Try Metformin 1 a day

## 2011-03-06 NOTE — Assessment & Plan Note (Signed)
Start antibiotic. UA and Cx next visit

## 2011-03-06 NOTE — Assessment & Plan Note (Signed)
On rx 

## 2011-03-06 NOTE — Assessment & Plan Note (Signed)
Worse. Start Metformin 1 a day.

## 2011-03-06 NOTE — Progress Notes (Signed)
  Subjective:    Patient ID: [name] [name], female    DOB: [birth year], 40 y.o.   MRN: [id number]  HPI  The patient presents for a follow-up visit to check on  diabetes, anemia, HTN    Review of Systems  Constitutional: Negative for activity change.  HENT: Negative for neck pain.   Respiratory: Negative for cough.   Cardiovascular: Negative for chest pain.  Genitourinary: Negative for dysuria and difficulty urinating.  Musculoskeletal: Negative for arthralgias.  Neurological: Positive for headaches. Negative for light-headedness.  Psychiatric/Behavioral: The patient is not hyperactive.        Objective:   Physical Exam  Constitutional: She appears well-developed and well-nourished. No distress.       Obese  HENT:  Head: Normocephalic.  Right Ear: External ear normal.  Left Ear: External ear normal.  Nose: Nose normal.  Mouth/Throat: Oropharynx is clear and moist.  Eyes: Conjunctivae are normal. Pupils are equal, round, and reactive to light. Right eye exhibits no discharge. Left eye exhibits no discharge.  Neck: Normal range of motion. Neck supple. No JVD present. No tracheal deviation present. No thyromegaly present.  Cardiovascular: Normal rate, regular rhythm and normal heart sounds.   Pulmonary/Chest: No stridor. No respiratory distress. She has no wheezes.  Abdominal: Soft. Bowel sounds are normal. She exhibits no distension and no mass. There is no tenderness. There is no rebound and no guarding.  Musculoskeletal: She exhibits no edema and no tenderness.  Lymphadenopathy:    She has no cervical adenopathy.  Neurological: She displays normal reflexes. No cranial nerve deficit. She exhibits normal muscle tone. Coordination normal.  Skin: No rash noted. No erythema.  Psychiatric: She has a normal mood and affect. Her behavior is normal. Judgment and thought content normal.       Assessment & Plan:  ANEMIA-IRON DEFICIENCY On rx  Diabetes type 2, controlled Worse.  Start Metformin 1 a day.  Acute cystitis Start antibiotic. UA and Cx next visit  OBESITY Wt Readings from Last 3 Encounters:  03/06/11 235 lb (106.595 kg)  11/04/10 237 lb (107.502 kg)  07/28/10 235 lb (106.595 kg)  Needs to loose wt.     Lab Results  Component Value Date   WBC 9.2 11/04/2010   HGB 13.2 11/04/2010   HGB SMALL 11/04/2010   HCT 39.2 11/04/2010   PLT 320.0 11/04/2010   CHOL 204* 11/04/2010   TRIG 98.0 11/04/2010   HDL 45.60 11/04/2010   LDLDIRECT 137.4 11/04/2010   ALT 16 11/04/2010   AST 15 11/04/2010   NA 133* 03/05/2011   K 4.1 03/05/2011   CL 98 03/05/2011   CREATININE 0.7 03/05/2011   BUN 20 03/05/2011   CO2 27 03/05/2011   TSH 2.08 11/04/2010   HGBA1C 7.2* 03/05/2011    Vit D def Check level   CBG 150

## 2011-03-06 NOTE — Assessment & Plan Note (Signed)
Wt Readings from Last 3 Encounters:  03/06/11 235 lb (106.595 kg)  11/04/10 237 lb (107.502 kg)  07/28/10 235 lb (106.595 kg)  Needs to loose wt.

## 2011-03-08 DIAGNOSIS — E559 Vitamin D deficiency, unspecified: Secondary | ICD-10-CM | POA: Insufficient documentation

## 2011-03-31 LAB — BASIC METABOLIC PANEL
Calcium: 8.8 mg/dL (ref 8.4–10.5)
Creatinine, Ser: 0.78 mg/dL (ref 0.4–1.2)
GFR calc Af Amer: >60 mL/min (ref >60)
GFR calc non Af Amer: >60 mL/min (ref >60)
Sodium: 135 mEq/L (ref 135–145)

## 2011-03-31 LAB — CBC
Hemoglobin: 7.2 g/dL — CL (ref 12.0–15.0)
Hemoglobin: 8.7 g/dL — ABNORMAL LOW (ref 12.0–15.0)
MCHC: 31.5 g/dL (ref 30.0–36.0)
Platelets: 336 10*3/uL (ref 150–400)
RDW: 23.1 % — ABNORMAL HIGH (ref 11.5–15.5)
RDW: 24.9 % — ABNORMAL HIGH (ref 11.5–15.5)

## 2011-03-31 LAB — HEMOGLOBIN AND HEMATOCRIT, BLOOD
HCT: 24.3 % — ABNORMAL LOW (ref 36.0–46.0)
Hemoglobin: 7.5 g/dL — CL (ref 12.0–15.0)

## 2011-03-31 LAB — TYPE AND SCREEN: Antibody Screen: NEGATIVE

## 2011-04-09 ENCOUNTER — Inpatient Hospital Stay (INDEPENDENT_AMBULATORY_CARE_PROVIDER_SITE_OTHER)
Admission: RE | Admit: 2011-04-09 | Discharge: 2011-04-09 | Disposition: A | Discharge status: Home / Self Care | Payer: 59 | Source: Ambulatory Visit | Attending: Family Medicine | Admitting: Family Medicine

## 2011-04-09 DIAGNOSIS — J40 Bronchitis, not specified as acute or chronic: Secondary | ICD-10-CM

## 2011-04-09 DIAGNOSIS — J019 Acute sinusitis, unspecified: Secondary | ICD-10-CM

## 2011-04-09 LAB — POCT RAPID STREP A (OFFICE): Streptococcus, Group A Screen (Direct): NEGATIVE

## 2011-04-13 ENCOUNTER — Telehealth: Payer: Self-pay

## 2011-04-13 MED ORDER — DEXAMETHASONE 1.5 MG PO KIT: 1.0000 | PACK | ORAL | Status: DC

## 2011-04-13 NOTE — Telephone Encounter (Signed)
Patient called stating that she was seen at [facility] and dx w/ bronchitis. She was given doxycycline 100mg  and nasonex. She c/o continued congestion and cough and would like MD advisement. Thanks

## 2011-04-13 NOTE — Telephone Encounter (Signed)
Rx for steroid pak sent to her pharm

## 2011-04-14 NOTE — Telephone Encounter (Signed)
LMOVM advising pt to check with pharmacy 

## 2011-04-20 ENCOUNTER — Encounter: Payer: Self-pay | Admitting: Endocrinology

## 2011-04-20 ENCOUNTER — Ambulatory Visit (INDEPENDENT_AMBULATORY_CARE_PROVIDER_SITE_OTHER)
Admission: RE | Admit: 2011-04-20 | Discharge: 2011-04-20 | Disposition: A | Discharge status: Home / Self Care | Payer: 59 | Source: Ambulatory Visit | Attending: Endocrinology | Admitting: Endocrinology

## 2011-04-20 ENCOUNTER — Ambulatory Visit (INDEPENDENT_AMBULATORY_CARE_PROVIDER_SITE_OTHER): Payer: 59 | Admitting: Endocrinology

## 2011-04-20 DIAGNOSIS — R059 Cough, unspecified: Secondary | ICD-10-CM

## 2011-04-20 DIAGNOSIS — R05 Cough: Secondary | ICD-10-CM

## 2011-04-20 DIAGNOSIS — J209 Acute bronchitis, unspecified: Secondary | ICD-10-CM

## 2011-04-20 MED ORDER — PROMETHAZINE-CODEINE 6.25-10 MG/5ML PO SYRP: 5.0000 mL | ORAL_SOLUTION | ORAL | Status: AC | PRN

## 2011-04-20 MED ORDER — METHYLPREDNISOLONE 4 MG PO KIT: PACK | ORAL | Status: AC

## 2011-04-20 NOTE — Progress Notes (Signed)
  Subjective:    Patient ID: [name] [name], female    DOB: 07/09/1971, 40 y.o.   MRN: [id number]  HPI 2 weeks of slight prod-quality cough, and assoc slight wheezing.  No help with abx x 2.  She also has slight sore throat.   She has had tah.  Review of Systems Denies fever and otalgia    Objective:   Physical Exam GENERAL: no distress LUNGS:  Clear to auscultation    Assessment & Plan:  Persistent cough due to acute bronchitis

## 2011-04-20 NOTE — Patient Instructions (Addendum)
Finish the antibiotics prescribed by dr Jonny Ruiz. here is a sample of "symbicort-80"  take 1 puff 2x a day.  rinse mouth after using. i have sent a prescription to you pharmacy for a steroid "pack." A chest x-ray is being ordered for you today.  please call [phone] to hear your test results.  You will be prompted to enter the 9-digit "MRN" number that appears at the top left of this page, followed by #.  Then you will hear the message. Here is a prescription for cough syrup.

## 2011-04-22 ENCOUNTER — Telehealth: Payer: Self-pay | Admitting: *Deleted

## 2011-04-22 MED ORDER — AZITHROMYCIN 250 MG PO TABS: ORAL_TABLET | ORAL | Status: AC

## 2011-04-22 NOTE — Telephone Encounter (Signed)
Zpac OV if not better Thx

## 2011-04-22 NOTE — Telephone Encounter (Signed)
Patient informed. 

## 2011-04-22 NOTE — Telephone Encounter (Signed)
Pt was dx on 4/23 with bronchitis, she did not improve and ws seen by Dr Everardo All. She does not feel any better and is very worried. What does MD advise?

## 2011-04-28 NOTE — Op Note (Signed)
[name], [name] NO.:  [id number]   MEDICAL RECORD NO.:  [id number]         PATIENT TYPE:  WOIB   LOCATION:                                FACILITY:  [facility]   PHYSICIAN:  Huel Cote, M.D. DATE OF BIRTH:  1971/01/19   DATE OF PROCEDURE:  02/06/2009  DATE OF DISCHARGE:                               OPERATIVE REPORT   PREOPERATIVE DIAGNOSES:  1. Abnormal uterine bleeding.  2. Anemia.  3. Fibroid uterus.   POSTOPERATIVE DIAGNOSES:  1. Abnormal uterine bleeding.  2. Anemia.  3. Fibroid uterus.   PROCEDURE:  Laparoscopic supracervical hysterectomy.   SURGEON:  Huel Cote, MD   ASSISTANT:  Zenaida Niece, MD   ANESTHESIA:  General.   FINDINGS:  The uterus with upper limits of normal in size.  Right ovary  and tube were normal.  There was a simple cyst on the left ovary which  was incidentally drained measuring approximately 2 cm.   SPECIMENS:  Morcellated uterine fundus sent to pathology.   ESTIMATED BLOOD LOSS:  200 mL.   URINE OUTPUT:  100 mL clear urine.   INTRAVENOUS FLUIDS:  2300 mL LR.   PROCEDURE:  The patient was taken to the operating room where general  anesthesia was obtained without difficulty.  She was then prepped and  draped in normal sterile fashion in the dorsal lithotomy position.  The  abdomen and vagina were prepped with a Foley catheter in place and  attention was turned to the umbilicus where it was injected with 0.25%  Marcaine solution.  A small 1 cm incision was made vertically just under  the umbilicus and the Veress needle introduced into the incision with  intraperitoneal placement confirmed with aspiration injection with  normal saline.  The gas flow was applied and pneumoperitoneum obtained  with approximately 2-3 L of CO2 gas.  The Veress needle was then removed  under direct view.  A 5-mm trocar was utilized to enter into the  subumbilicus incision.  With camera in place, the abdomen and pelvis  were  inspected with the findings as previously stated, all appeared  mobile, and there was no significant pathology of the tubes and ovaries.  Therefore, an additional 5-mm trocar was placed in the upper right  quadrant after injection with 0.25% Marcaine under direct visualization  and a 12-mm trocar was placed in the upper left quadrant under direct  visualization, also with prior injection with 0.25% Marcaine with the  instruments in place.  The uterus was grasped with tenaculum and the  utero-ovarian ligaments, broad ligaments, and round ligaments taken down  with the harmonic scalpel sequentially.  Each step was carefully  performed to ensure hemostasis.  Once down at the level of the internal  cervical os, the bladder flap was created across the uterus.  There was  some scarring of the bladder to the lower uterine segment, so careful  attention was taken in this area to try to develop a bladder reflection  in this thick tissue.  Once the uterine arteries had been skeletonized  as could best be performed, they were  taken with the harmonic scalpel  and appeared hemostatic.  At this point, the cervix was identified just  above the uterosacral ligaments and the harmonic scalpel was utilized to  amputate the uterine fundus from the cervical stump.  Once this had been  completely performed, there were no significant areas of bleeding and  the pelvis was copiously irrigated and all pedicles inspected and found  to be hemostatic.  Since all appeared hemostatic, the 12-mm trocar was  replaced with the Storz morcellator and under direct visualization, the  uterine fundus morcellated and removed from the abdomen.  Once this was  completed, any small pieces of tissue were removed with the scoop and  the 12-mm trocar was replaced with the Storz morcellator removed.  The  abdomen and pelvis were once again copiously irrigated.  The side walls  were visualized with there being no apparent close proximity  to the  ureters with our dissection.  The left ovary was elevated and an  incidental cystotomy performed with clear fluid drained from the cyst on  the ovary.  There was no active bleeding noted and therefore, a small  piece of intercede was placed through the 12-mm trocar and placed over  the cervical stump.  The remainder of the abdomen and pelvis appeared  hemostatic.  The 12-mm trocar was then removed and with the camera  viewing underneath, the subfascial tissues were closed as could best be  possible with a 0 Vicryl on a UR needle in a figure-of-eight suture.  Once this was preformed, the remaining trocars were removed under direct  visualization and no active bleeding ensued.  The skin was then closed  with 3-0 Vicryl and subcuticular stitch, and the remainder of the  pneumoperitoneum reduced through the subumbilicus port before it was  completely removed.  Once this was performed, all sponge, lap, and  needle counts were correct x2.  The trocar incisions were reinforced  with Dermabond and all appeared hemostatic.  The patient was then  awakened and taken to the recovery room in stable condition.      Huel Cote, M.D.  Electronically Signed     KR/MEDQ  D:  02/07/2009  T:  02/08/2009  Job:  [id number]

## 2011-04-28 NOTE — H&P (Signed)
NAMEBRIDGITT, RAGGIO NO.:  [id number]   MEDICAL RECORD NO.:  [id number]          PATIENT TYPE:  AMB   LOCATION:                                FACILITY:  [facility]   PHYSICIAN:  Huel Cote, M.D. DATE OF BIRTH:  1971/11/16   DATE OF ADMISSION:  02/07/2009  DATE OF DISCHARGE:                              HISTORY & PHYSICAL   The patient is a 40 year old G2, P2 who is coming in for a scheduled  laparoscopic supracervical hysterectomy given an ongoing problem with  abnormal uterine bleeding resulting in anemia and a wish for definitive  surgical therapy.  The patient began to have abnormal bleeding  approximately 3-4 months ago and had heavy cycles with increasing clots  and a long cycle lasting approximately 2 weeks at times.  Her hemoglobin  began at approximately 12.  She was placed on Aygestin due to persistent  bleeding and did have an endometrial biopsy performed which was normal.  She was off and on with the Aygestin as far as taking it and did not see  any significant relief.  She continued to bleed over the subsequent 2  months off and on, and eventually her hemoglobin drifted down to  approximately 7.8.  During this time, she was worked up with a saline  infusion ultrasound and endometrial biopsy as stated.  Her ultrasound  really revealed only some small fibroids and a thickened endometrium  with no evidence of polyps or fibroids and a normal biopsy.  She  continued to bleed off and on and stayed on her Aygestin.  Her most  recent hemoglobin in the office was 8.1.   PAST MEDICAL HISTORY:  Significant for chronic hypertension and back  pain.   PAST SURGICAL HISTORY:  Significant for Bartholin excision, two C-  sections, tubal ligation, and breast reduction, as well as some nasal  surgery.   PAST OBSTETRICAL HISTORY:  She had two cesarean sections and two  elective abortions.   PAST GYNECOLOGIC HISTORY:  No significant abnormal Pap smears recently.  She has no breast cancer, colon cancer, or heart disease in the family.   MEDICATIONS:  Diovan, hydrochlorothiazide, and the Aygestin as stated.   ALLERGIES:  None.   PHYSICAL EXAMINATION:  VITAL SIGNS:  Blood pressure is 110/80, weight is  approximately 215 pounds.  CARDIAC:  Regular rate and rhythm.  LUNGS:  Clear.  ABDOMEN:  Soft and nontender.  PELVIC:  She has external genitalia which are consistent with her  healing Bartholin site excision, eschar there from the past.  Cervix is  normal.  Uterus is normal size and adnexa are also normal.   She was counseled as to all of her options after her saline infusion  ultrasound including possibility of NovaSure ablation, regulation with  hormonal therapy, and possible hysterectomy.  The patient after careful  consideration decided that she would most like to proceed with a  definitive surgical therapy given the problems with bleeding that she  has had and resulting in the significant anemia.  We discussed  laparoscopic supracervical hysterectomy given that she has had no recent  abnormal Pap smear.  She wishes to proceed with the cervix sparing  surgery for a shorter recovery time.  We discussed the risks and  benefits of surgery including bleeding, infection, and possible damage  to surrounding organs like the bladder and intestine.  We also discussed  the need to convert to an abdominal incision should any complications  arise or should the uterus prove too difficult to remove  laparoscopically.  She understands the risks involved and desires to  proceed with the laparoscopic supracervical hysterectomy with conversion  to abdominal hysterectomy only if normal.  We are planning to conserve  her ovaries due to her young age unless any significant pathology was  involved and she is comfortable with this.  Again, we will proceed as  stated with the hysterectomy and possible abdominal approach should it  not be possible to complete that  way.  We will have her typed and  screened given her relative anemia, although hopefully we can avoid any  blood transfusions.  She is aggressively taking iron and hopefully will  improve her blood count even more prior to the surgery.      Huel Cote, M.D.  Electronically Signed     KR/MEDQ  D:  02/05/2009  T:  02/05/2009  Job:  [id number]

## 2011-04-29 ENCOUNTER — Telehealth: Payer: Self-pay | Admitting: *Deleted

## 2011-04-29 NOTE — Telephone Encounter (Signed)
Pt continues to c/o persistent cough, worse at night with wheezing at night. She has had normal xrays and had zpak recently. Pt wants to know where to go from here.

## 2011-04-29 NOTE — Telephone Encounter (Signed)
Either cont MDI and wait if getting better or sch OV pls Thx

## 2011-04-30 NOTE — Telephone Encounter (Signed)
Patient informed. 

## 2011-05-01 NOTE — Op Note (Signed)
NAME:  [name] [name], [name] [name]                        ACCOUNT NO.:  [id number]   MEDICAL RECORD NO.:  [id number]                   PATIENT TYPE:  AMB   LOCATION:  DAY                                  FACILITY:  [facility]   PHYSICIAN:  Huel Cote, M.D.              DATE OF BIRTH:  28-May-1971   DATE OF PROCEDURE:  04/23/2004  DATE OF DISCHARGE:                                 OPERATIVE REPORT   PREOPERATIVE DIAGNOSIS:  Bartholin cyst.   POSTOPERATIVE DIAGNOSIS:  Bartholin cyst.   PROCEDURE:  Excision of Bartholin cyst and gland.   SURGEON:  Dr. Huel Cote   ANESTHESIA:  LMA.   FINDINGS:  A 2-3 cm Bartholin cyst with mucinous material.  The gland  appeared normal.   ESTIMATED BLOOD LOSS:  300 mL.   URINE OUTPUT:  100 mL straight cath prior to procedure.   IV FLUIDS:  1500 mL LR.   TO PATHOLOGY:  The cyst and gland were sent.   DESCRIPTION OF PROCEDURE:  The patient was taken to the operating room where  MAC anesthesia was obtained, and the patient was placed in the dorsal  lithotomy position.  After a sterile prep, the patient's right labia was  examined and found to have a 3 cm palpable cyst.  The labia was instilled  with 1% plain lidocaine approximately 15-20 mL and an incision made on the  inner labia with a scalpel approximately 3 cm in length.  At this point,  dissection was begun with Metzenbaum scissors and Bovie cautery, and the  cyst wall was found and grasped with an Allis clamp and elevated.  This was  shelled out slowly with Metzenbaum scissors.  At this point in the  procedure, the patient began to move considerably in the stirrups, and MAC  anesthesia was not adequate to keep her still; therefore, the decision was  made to proceed with LMA anesthesia and a slightly deeper sedation.  Anesthesiologist was called into the room, and this was performed without  difficulty.  The patient then became still, and anesthesia was found to be  adequate.  The remainder of  the cyst was shelled out with Metzenbaum  scissors, at one point cyst wall opened, and clear mucinous material was  noted to exude from the cyst.  When the cyst and gland were dissected away  from the underlying tissue as well as could be palpated, two slightly curved  Kelly clamps were placed around the base given that the patient had an  extremely vascular labia to minimize blood loss, and this was trimmed away  with Mayo scissors.  Two suture ligatures were placed on each clamp of 0  Vicryl.  The inner labia was then inspected and the cavity where the cyst  had been contained had several areas of bleeding.  These were controlled  with both Bovie cautery and several interrupted sutures of 0 Vicryl.  The  cavity defect  was closed with 0 Vicryl and several additional interrupted  sutures.  The vaginal mucosa and labial mucosa were reapproximated with 2-0  Vicryl in a running lock suture, and this was slightly irregular as it had  been slightly torn when the patient when  began moving earlier in the procedure.  However, this closed nicely with no  active bleeding noted.  At the conclusion of the procedure, sponge, lap, and  needle counts were correct x 2, and the patient was taken to the recovery  room, awakened in stable condition.                                               Huel Cote, M.D.    KR/MEDQ  D:  04/23/2004  T:  04/23/2004  Job:  [id number]

## 2011-05-01 NOTE — H&P (Signed)
NAME:  [name] [name], [name] [name] NO.:  [id number]   MEDICAL RECORD NO.:  [id number]                   PATIENT TYPE:   LOCATION:                                       FACILITY:  [facility]   PHYSICIAN:  Huel Cote, M.D.              DATE OF BIRTH:  12-03-1971   DATE OF ADMISSION:  04/23/2004  DATE OF DISCHARGE:                                HISTORY & PHYSICAL   HISTORY OF PRESENT ILLNESS:  The patient is a 40 year old G4, P2, who is  admitted for a scheduled outpatient procedure with a possible excision  versus marsupialization of the Bartholin's gland cyst.  The patient notes  that she has had this cyst present for several years and at times it swells  significantly and is uncomfortable with intercourse and for that reason, is  wishing surgical intervention.  Although the cyst is there all the time, it  is definitely larger at times.   PAST MEDICAL HISTORY:  1. Chronic hypertension.  2. Back pain.   PAST SURGICAL HISTORY:  1. C-section x2.  2. Tubal ligation.   ALLERGIES:  She has no known drug allergies.   MEDICATIONS:  She is on Diovan for her blood pressure.   GYNECOLOGICAL HISTORY:  She has no recent abnormal Pap smears.  Her menses  are currently once a month and occur for 3 days at a time.   PAST OBSTETRICAL HISTORY:  Past obstetrical history is significant for 2  cesarean section and 2 elective abortions.   PHYSICAL EXAMINATION:  GENERAL:  On physical exam, her height is 5 foot 4  inches, weight is 214 pounds.  VITAL SIGNS:  Blood pressure is 125/100.  CARDIAC:  Regular rate and rhythm.  LUNGS:  Lungs are clear.  ABDOMEN:  Abdomen is soft and nontender.  PELVIC:  She has normal left labium; on the right labium, the patient does  have a protrusion which is non-tense.  There is a Bartholin's cyst palpable,  however, it is quite deep in the tissue and its borders are not extremely  well-demarcated.  Uterus is upper limits of normal.  Adnexa  have no masses.   ASSESSMENT AND PLAN:  The patient was counseled as to her options including  expectant management with no intervention, given that the Bartholin's cyst  certainly is not extremely large in size; it is also discussed with her the  difficulty in excision if a gland is not discretely palpable along the  borders.  The patient wishes an intervention performed, given her  intermittent issues with swelling which occurs quite frequently and is  painful with intercourse.  For this reason, we discussed marsupialization in  detail as well as possible scarring related to that and possible excision.  The patient understands the decision to excise or marsupialize will be made  at the time of surgery,  depending on how well the gland can be demonstrated.  Risks  of bleeding and  infection were discussed with the patient, as well as scarring and possible  recurrence of the cyst itself; the patient understands these risks and  desires to proceed with the surgery as stated.                                               Huel Cote, M.D.    KR/MEDQ  D:  04/16/2004  T:  04/16/2004  Job:  [id number]

## 2011-05-05 ENCOUNTER — Telehealth: Payer: Self-pay | Admitting: *Deleted

## 2011-05-05 NOTE — Telephone Encounter (Signed)
Pt now is losing her voice - she is very worried b/c her mother was dx with esophageal cancer. She says mother had similar symptoms - cough, cold then lost voice and then dx w/cancer. Pt is worried. Please advise.

## 2011-05-06 NOTE — Telephone Encounter (Signed)
Keep ROV - we will address. She is way too young for cancers in the esophagus Thx

## 2011-05-06 NOTE — Telephone Encounter (Signed)
Patient informed next OV is 6/26. I advised her it symptoms were still persistent to call office next week for re-eval b/c she is very worried about cancer etc..

## 2011-05-18 ENCOUNTER — Other Ambulatory Visit: Payer: Self-pay | Admitting: *Deleted

## 2011-05-18 MED ORDER — OLMESARTAN-AMLODIPINE-HCTZ 40-5-25 MG PO TABS: 1.0000 | ORAL_TABLET | Freq: Every day | ORAL | Status: DC

## 2011-05-25 ENCOUNTER — Encounter: Payer: Self-pay | Admitting: Internal Medicine

## 2011-05-27 ENCOUNTER — Institutional Professional Consult (permissible substitution): Payer: 59 | Admitting: Internal Medicine

## 2011-06-05 ENCOUNTER — Other Ambulatory Visit (INDEPENDENT_AMBULATORY_CARE_PROVIDER_SITE_OTHER): Payer: 59

## 2011-06-05 DIAGNOSIS — E119 Type 2 diabetes mellitus without complications: Secondary | ICD-10-CM

## 2011-06-05 DIAGNOSIS — D509 Iron deficiency anemia, unspecified: Secondary | ICD-10-CM

## 2011-06-05 DIAGNOSIS — N3090 Cystitis, unspecified without hematuria: Secondary | ICD-10-CM

## 2011-06-05 LAB — CBC WITH DIFFERENTIAL/PLATELET
Basophils Relative: 0.8 % (ref 0.0–3.0)
Eosinophils Relative: 1.6 % (ref 0.0–5.0)
Lymphocytes Relative: 32.8 % (ref 12.0–46.0)
Monocytes Relative: 7.8 % (ref 3.0–12.0)
Neutrophils Relative %: 57 % (ref 43.0–77.0)
RBC: 4.55 Mil/uL (ref 3.87–5.11)
WBC: 10.2 10*3/uL (ref 4.5–10.5)

## 2011-06-05 LAB — IBC PANEL
Iron: 68 ug/dL (ref 42–145)
Saturation Ratios: 18.7 % — ABNORMAL LOW (ref 20.0–50.0)

## 2011-06-05 LAB — URINALYSIS
Nitrite: NEGATIVE
Specific Gravity, Urine: <=1.005 (ref 1.000–1.030)
Total Protein, Urine: NEGATIVE
Urine Glucose: NEGATIVE

## 2011-06-05 LAB — HEMOGLOBIN A1C: Hgb A1c MFr Bld: 6.9 % — ABNORMAL HIGH (ref 4.6–6.5)

## 2011-06-06 LAB — VITAMIN D 25 HYDROXY (VIT D DEFICIENCY, FRACTURES): Vit D, 25-Hydroxy: 17 ng/mL — ABNORMAL LOW (ref 30–89)

## 2011-06-09 ENCOUNTER — Other Ambulatory Visit (INDEPENDENT_AMBULATORY_CARE_PROVIDER_SITE_OTHER): Payer: 59 | Admitting: Internal Medicine

## 2011-06-09 ENCOUNTER — Ambulatory Visit (INDEPENDENT_AMBULATORY_CARE_PROVIDER_SITE_OTHER): Payer: 59 | Admitting: Internal Medicine

## 2011-06-09 ENCOUNTER — Other Ambulatory Visit (HOSPITAL_COMMUNITY)
Admission: RE | Admit: 2011-06-09 | Discharge: 2011-06-09 | Disposition: A | Discharge status: Home / Self Care | Payer: 59 | Source: Ambulatory Visit | Attending: Internal Medicine | Admitting: Internal Medicine

## 2011-06-09 ENCOUNTER — Encounter: Payer: Self-pay | Admitting: Internal Medicine

## 2011-06-09 ENCOUNTER — Other Ambulatory Visit: Payer: 59

## 2011-06-09 VITALS — BP 120/70 | HR 80 | Temp 97.8°F | Resp 16 | Ht 64.0 in | Wt 231.0 lb

## 2011-06-09 DIAGNOSIS — Z7721 Contact with and (suspected) exposure to potentially hazardous body fluids: Secondary | ICD-10-CM

## 2011-06-09 DIAGNOSIS — Z0000 Encounter for general adult medical examination without abnormal findings: Secondary | ICD-10-CM

## 2011-06-09 DIAGNOSIS — Z113 Encounter for screening for infections with a predominantly sexual mode of transmission: Secondary | ICD-10-CM | POA: Insufficient documentation

## 2011-06-09 DIAGNOSIS — Z124 Encounter for screening for malignant neoplasm of cervix: Secondary | ICD-10-CM

## 2011-06-09 DIAGNOSIS — Z01419 Encounter for gynecological examination (general) (routine) without abnormal findings: Secondary | ICD-10-CM | POA: Insufficient documentation

## 2011-06-09 DIAGNOSIS — Z1159 Encounter for screening for other viral diseases: Secondary | ICD-10-CM | POA: Insufficient documentation

## 2011-06-09 LAB — URINALYSIS, ROUTINE W REFLEX MICROSCOPIC
Hgb urine dipstick: NEGATIVE
Nitrite: NEGATIVE
Specific Gravity, Urine: >=1.03 (ref 1.000–1.030)
Total Protein, Urine: 30
Urine Glucose: NEGATIVE
Urobilinogen, UA: 0.2 (ref 0.0–1.0)

## 2011-06-09 NOTE — Assessment & Plan Note (Signed)
Pelvic/labs Safe sex discussed

## 2011-06-09 NOTE — Progress Notes (Signed)
  Subjective:    Patient ID: [name] [name], female    DOB: 03-Jun-1971, 40 y.o.   MRN: [id number]  HPI  C/o unprotected vaginal sex episode a few days ago - she is worried and would like to be tested for STD and have a PAP. No complaints  Review of Systems  Constitutional: Negative for chills, activity change, appetite change, fatigue and unexpected weight change.  HENT: Negative for congestion, mouth sores and sinus pressure.   Eyes: Negative for visual disturbance.  Respiratory: Negative for cough and chest tightness.   Gastrointestinal: Negative for nausea and abdominal pain.  Genitourinary: Positive for vaginal discharge (mild). Negative for urgency, frequency, decreased urine volume, vaginal bleeding, difficulty urinating, vaginal pain and pelvic pain.  Musculoskeletal: Negative for back pain, arthralgias and gait problem.  Skin: Negative for pallor and rash.  Neurological: Negative for dizziness, tremors, weakness, numbness and headaches.  Psychiatric/Behavioral: Negative for confusion and sleep disturbance.       Objective:   Physical Exam  Constitutional: She appears well-developed and well-nourished. No distress.  HENT:  Head: Normocephalic.  Right Ear: External ear normal.  Left Ear: External ear normal.  Nose: Nose normal.  Mouth/Throat: Oropharynx is clear and moist.  Eyes: Conjunctivae are normal. Pupils are equal, round, and reactive to light. Right eye exhibits no discharge. Left eye exhibits no discharge.  Neck: Normal range of motion. Neck supple. No JVD present. No tracheal deviation present. No thyromegaly present.  Cardiovascular: Normal rate, regular rhythm and normal heart sounds.   Pulmonary/Chest: No stridor. No respiratory distress. She has no wheezes.  Abdominal: Soft. Bowel sounds are normal. She exhibits no distension and no mass. There is no tenderness. There is no rebound and no guarding. Hernia confirmed negative in the right inguinal area and  confirmed negative in the left inguinal area.  Genitourinary: Uterus normal. There is no rash, tenderness, lesion or injury on the right labia. There is no rash, tenderness or injury on the left labia. No erythema, tenderness or bleeding around the vagina. Vaginal discharge: grey, small amount.  Musculoskeletal: She exhibits no edema and no tenderness.  Lymphadenopathy:    She has no cervical adenopathy.       Right: No inguinal adenopathy present.       Left: No inguinal adenopathy present.  Neurological: She displays normal reflexes. No cranial nerve deficit. She exhibits normal muscle tone. Coordination normal.  Skin: No rash noted. No erythema.  Psychiatric: She has a normal mood and affect. Her behavior is normal. Judgment and thought content normal.          Assessment & Plan:   PAP obtained

## 2011-06-10 LAB — HEPATITIS B CORE ANTIBODY, TOTAL: Hep B Core Total Ab: NEGATIVE

## 2011-06-10 LAB — HEPATITIS B SURFACE ANTIGEN: Hepatitis B Surface Ag: NEGATIVE

## 2011-06-12 ENCOUNTER — Telehealth: Payer: Self-pay | Admitting: Internal Medicine

## 2011-06-12 NOTE — Telephone Encounter (Signed)
[name] [name], please, inform patient that all tests are normal. Some are not back yet. Do we have a wet prep back?  Thx

## 2011-06-15 MED ORDER — METRONIDAZOLE 250 MG PO TABS: 250.0000 mg | ORAL_TABLET | Freq: Four times a day (QID) | ORAL | Status: DC

## 2011-06-15 NOTE — Telephone Encounter (Signed)
Per Dr. Posey Rea will sens in new Rx for Flagyl for excessive vaginal discharge.  Left mess for patient to call back.

## 2011-06-15 NOTE — Telephone Encounter (Signed)
Pt informed rx sent to her pharmacy.

## 2011-06-15 NOTE — Telephone Encounter (Signed)
Pt informed. I called [facility] and they state specimen is not logged/resulted.

## 2011-06-16 ENCOUNTER — Ambulatory Visit (INDEPENDENT_AMBULATORY_CARE_PROVIDER_SITE_OTHER): Payer: 59 | Admitting: Internal Medicine

## 2011-06-16 ENCOUNTER — Encounter: Payer: Self-pay | Admitting: Internal Medicine

## 2011-06-16 VITALS — BP 126/86 | HR 77 | Temp 98.0°F | Ht 64.0 in | Wt 235.8 lb

## 2011-06-16 DIAGNOSIS — R05 Cough: Secondary | ICD-10-CM

## 2011-06-16 NOTE — Progress Notes (Signed)
Subjective:    Patient ID: [name] [name], female    DOB: 03-28-1971, 40 y.o.   MRN: [id number]  Cough This is a new (40 year old  obese female. Non smoker though passively smoked birth to age 40. Works for [employer], site. Self reffered. DR Plotnikov is PMD) problem. Episode onset: Cough started 04/06/2011. At onset had chest tightness, yellow sputum, wheezing, fever, URI symptoims. 4 days later went tourgent care and was told she was febrile, celear lungs and dx bronchitis. Given abx. Few days later given pred, mdi nos, anti tussive. The problem has been resolved (Intiially was not reslving despite first course prednisone. One week later got 2nd pred course. Another one week later called in zpak over phone. Another week later, started to geting better and further 2 weeks  later (first part of june) got betterr fully). The problem occurs constantly (Intitally coughing constantly but now cough since last 1 month). The cough is productive of sputum and productive of purulent sputum (initially sputum but now no sputum). Associated symptoms include a fever, nasal congestion, postnasal drip, shortness of breath and wheezing. Pertinent negatives include no ear pain, eye redness, headaches, rash, rhinorrhea or sore throat. Associated symptoms comments: cxr 04/20/2011 - normal (independently reviewed). But coworker had walking pneumonia with similar symptoms in early June 2012. Cough has now resolved. The symptoms are aggravated by lying down. Risk factors: has a dog but that does not make her cough. She has tried nothing for the symptoms. The treatment provided significant relief. Her past medical history is significant for asthma. There is no history of bronchiectasis, bronchitis, COPD, emphysema or environmental allergies. ASthma as a kid but now outgrew. No prior similar cough - this was first episode. Other PMHX: obesity, borderline dm, gerd, bp   Past Medical History  Diagnosis Date  . Chronic headache     Dr  Vela Prose, Neg MRI 2005  . Anemia, iron deficiency   . HTN (hypertension)   . GERD (gastroesophageal reflux disease)   . Elevated glucose 2011  . Diabetes mellitus      Family History  Problem Relation Age of Onset  . Hypertension Mother   . Cancer Mother 92    Esophageal  . Hypertension Maternal Grandmother      History   Social History  . Marital Status: Single    Spouse Name: N/A    Number of Children: 2  . Years of Education: N/A   Occupational History  . Not on file.   Social History Main Topics  . Smoking status: Never Smoker   . Smokeless tobacco: Not on file  . Alcohol Use: No  . Drug Use: No  . Sexually Active: Not on file   Other Topics Concern  . Not on file   Social History Narrative   Regular exercise - YES     Allergies  Allergen Reactions  . Ciprofloxacin     REACTION: gittery  . Nitrofurantoin      Outpatient Prescriptions Prior to Visit  Medication Sig Dispense Refill  . butalbital-acetaminophen-caffeine (FIORICET, ESGIC) 50-325-40 MG per tablet Take 1-2 tablets by mouth 2 (two) times daily as needed. For migraine or tension HA       . Cholecalciferol 1000 UNITS tablet Take 1,000 Units by mouth daily.        Marland Kitchen dexlansoprazole (DEXILANT) 60 MG capsule Take 60 mg by mouth every morning. For indigestion       . eletriptan (RELPAX) 40 MG tablet One tablet by  mouth as needed for migraine headache.  If the headache improves and then returns, dose may be repeated after 2 hours have elapsed since first dose (do not exceed 80 mg per day). For migraines       . fluconazole (DIFLUCAN) 150 MG tablet Take 150 mg by mouth once. For yeast infection       . ketoconazole (NIZORAL) 2 % shampoo Apply 1 application topically 2 (two) times a week.        . meloxicam (MOBIC) 15 MG tablet Take 7.5-15 mg by mouth daily as needed.        . metFORMIN (GLUCOPHAGE) 500 MG tablet Take 1 tablet (500 mg total) by mouth 2 (two) times daily with a meal.  60 tablet  11  .  metroNIDAZOLE (FLAGYL) 250 MG tablet Take 1 tablet (250 mg total) by mouth 4 (four) times daily.  20 tablet  0  . Olmesartan-Amlodipine-HCTZ (TRIBENZOR) 40-5-25 MG TABS Take 1 tablet by mouth daily.  90 tablet  1  . rizatriptan (MAXALT) 10 MG tablet Take 10 mg by mouth as needed. For headache       . triamcinolone (KENALOG) 0.5 % cream Apply topically 2 (two) times daily as needed.        . zolpidem (AMBIEN) 10 MG tablet Take 5-10 mg by mouth at bedtime as needed.        . doxycycline (DORYX) 100 MG EC tablet Take 100 mg by mouth 2 (two) times daily.             Review of Systems  Constitutional: Positive for fever. Negative for unexpected weight change.  HENT: Positive for postnasal drip. Negative for ear pain, nosebleeds, congestion, sore throat, rhinorrhea, sneezing, trouble swallowing, dental problem and sinus pressure.   Eyes: Negative for redness and itching.  Respiratory: Positive for cough, chest tightness, shortness of breath and wheezing.   Cardiovascular: Negative for palpitations and leg swelling.  Gastrointestinal: Negative for nausea and vomiting.  Genitourinary: Negative for dysuria.  Musculoskeletal: Negative for joint swelling.  Skin: Negative for rash.  Neurological: Negative for headaches.  Hematological: Negative for environmental allergies. Does not bruise/bleed easily.  Psychiatric/Behavioral: Negative for dysphoric mood. The patient is not nervous/anxious.        Objective:   Physical Exam  Vitals reviewed. Constitutional: She is oriented to person, place, and time. She appears well-developed and well-nourished. No distress.       Obese   HENT:  Head: Normocephalic and atraumatic.  Right Ear: External ear normal.  Left Ear: External ear normal.  Mouth/Throat: Oropharynx is clear and moist. No oropharyngeal exudate.  Eyes: Conjunctivae and EOM are normal. Pupils are equal, round, and reactive to light. Right eye exhibits no discharge. Left eye exhibits no  discharge. No scleral icterus.  Neck: Normal range of motion. Neck supple. No JVD present. No tracheal deviation present. No thyromegaly present.  Cardiovascular: Normal rate, regular rhythm, normal heart sounds and intact distal pulses.  Exam reveals no gallop and no friction rub.   No murmur heard. Pulmonary/Chest: Effort normal and breath sounds normal. No respiratory distress. She has no wheezes. She has no rales. She exhibits no tenderness.  Abdominal: Soft. Bowel sounds are normal. She exhibits no distension and no mass. There is no tenderness. There is no rebound and no guarding.  Musculoskeletal: Normal range of motion. She exhibits no edema and no tenderness.  Lymphadenopathy:    She has no cervical adenopathy.  Neurological: She is alert and oriented  to person, place, and time. She has normal reflexes. No cranial nerve deficit. She exhibits normal muscle tone. Coordination normal.  Skin: Skin is warm and dry. No rash noted. She is not diaphoretic. No erythema. No pallor.  Psychiatric: She has a normal mood and affect. Her behavior is normal. Judgment and thought content normal.          Assessment & Plan:

## 2011-06-16 NOTE — Assessment & Plan Note (Signed)
Likely post viral reactive cough that is now resolved. DDx is first episode of recurrence of adult asthma  Plan Expectant clinical followup If recurs, do spirometry before starting Rx and if obstructed consider long term controller medications

## 2011-06-16 NOTE — Patient Instructions (Signed)
Glad you are better [facility] likely had post bronchitis cough that has now resolved spontaneously If this comes back, please return to test for asthma No active follwoup

## 2011-07-31 ENCOUNTER — Ambulatory Visit
Admission: RE | Admit: 2011-07-31 | Discharge: 2011-07-31 | Disposition: A | Discharge status: Home / Self Care | Payer: 59 | Source: Ambulatory Visit | Attending: Otolaryngology | Admitting: Otolaryngology

## 2011-07-31 ENCOUNTER — Other Ambulatory Visit: Payer: Self-pay | Admitting: Otolaryngology

## 2011-07-31 DIAGNOSIS — R52 Pain, unspecified: Secondary | ICD-10-CM

## 2011-08-26 ENCOUNTER — Telehealth: Payer: Self-pay | Admitting: *Deleted

## 2011-08-26 NOTE — Telephone Encounter (Signed)
Pt left vm stating her ins is requesting notes or info on the labs that were drawn in June. I called [phone] and spoke to [facility]. She states they need the OV note stating the tests were medically necessary. OV note from 6.26.12 faxed to [phone]. Pt informed

## 2011-09-11 ENCOUNTER — Ambulatory Visit: Payer: 59 | Admitting: Internal Medicine

## 2011-09-14 LAB — CBC
HCT: 36.1
MCV: 85.4
Platelets: 372
RDW: 14.7
WBC: 13.6 — ABNORMAL HIGH

## 2011-09-14 LAB — DIFFERENTIAL
Eosinophils Absolute: 0.1
Eosinophils Relative: 1
Lymphs Abs: 1.6

## 2011-09-14 LAB — URINE MICROSCOPIC-ADD ON

## 2011-09-14 LAB — URINALYSIS, ROUTINE W REFLEX MICROSCOPIC
Nitrite: NEGATIVE
Specific Gravity, Urine: 1.024
Urobilinogen, UA: 1

## 2011-09-14 LAB — WET PREP, GENITAL: Yeast Wet Prep HPF POC: NONE SEEN

## 2011-09-18 ENCOUNTER — Telehealth: Payer: Self-pay | Admitting: *Deleted

## 2011-09-18 NOTE — Telephone Encounter (Signed)
Pt called left vm stating she is still getting bills for labs drawn 06-09-11. She is now requesting that I call [phone].

## 2011-09-22 NOTE — Telephone Encounter (Signed)
Spoke to [place] @ [employer]. He states all claims were paid at 100%. Pt informed and provided her with [employer] billing phone number.

## 2011-12-19 ENCOUNTER — Encounter: Payer: Self-pay | Admitting: Family Medicine

## 2011-12-19 ENCOUNTER — Ambulatory Visit (INDEPENDENT_AMBULATORY_CARE_PROVIDER_SITE_OTHER): Payer: 59 | Admitting: Family Medicine

## 2011-12-19 VITALS — BP 156/100 | HR 86 | Temp 97.2°F | Ht 64.0 in | Wt 235.0 lb

## 2011-12-19 DIAGNOSIS — M545 Low back pain, unspecified: Secondary | ICD-10-CM

## 2011-12-19 MED ORDER — HYDROCODONE-ACETAMINOPHEN 5-500 MG PO TABS: 1.0000 | ORAL_TABLET | Freq: Four times a day (QID) | ORAL | Status: AC | PRN

## 2011-12-19 MED ORDER — CYCLOBENZAPRINE HCL 10 MG PO TABS: 10.0000 mg | ORAL_TABLET | Freq: Three times a day (TID) | ORAL | Status: AC | PRN

## 2011-12-19 NOTE — Patient Instructions (Signed)
Try heat or ice for symptom relief and stretches as instructed. Follow up with primary in 2 weeks if no better.

## 2011-12-19 NOTE — Progress Notes (Signed)
  Subjective:    Patient ID: [name] [name], female    DOB: Nov 20, 1971, 41 y.o.   MRN: [id number]  HPI  Acute visit. Saturday clinic. Two day history of right lower lumbar back pain. No radiculopathy symptoms. No injury. Denies any weakness or numbness. No urine or stool incontinence. Symptoms worse with walking. Pain is moderate in severity. Ibuprofen 800 mg without improvement. Difficulty sleeping secondary to pain. Denies any fever or chills. No appetite or weight changes.   Review of Systems  Constitutional: Negative for fever, chills, activity change, appetite change and unexpected weight change.  Respiratory: Negative for cough.   Gastrointestinal: Negative for abdominal pain.  Genitourinary: Negative for dysuria.  Neurological: Negative for weakness and numbness.  Hematological: Negative for adenopathy.       Objective:   Physical Exam  Constitutional: She appears well-developed and well-nourished. No distress.  Cardiovascular: Normal rate and regular rhythm.   Pulmonary/Chest: Effort normal and breath sounds normal. No respiratory distress. She has no wheezes. She has no rales.  Musculoskeletal: She exhibits no edema.       Straight leg raise is negative. Good range of motion lumbar spine. Minimal tenderness right lower lumbar region  Neurological:       Full-strength lower extremities. Trace reflexes in knee and ankle bilaterally. Normal sensory function.          Assessment & Plan:  Right lumbar back pain. Suspect musculoskeletal. Flexeril 10 mg every 8 hours when necessary with caution for sedation. Try heat or ice. Reviewed stretches. Limited hydrocodone as no relief with ibuprofen 800 mg

## 2012-02-17 ENCOUNTER — Other Ambulatory Visit: Payer: Self-pay | Admitting: Internal Medicine

## 2012-02-17 DIAGNOSIS — Z1231 Encounter for screening mammogram for malignant neoplasm of breast: Secondary | ICD-10-CM

## 2012-03-02 ENCOUNTER — Ambulatory Visit
Admission: RE | Admit: 2012-03-02 | Discharge: 2012-03-02 | Disposition: A | Discharge status: Home / Self Care | Payer: 59 | Source: Ambulatory Visit | Attending: Internal Medicine | Admitting: Internal Medicine

## 2012-03-02 DIAGNOSIS — Z1231 Encounter for screening mammogram for malignant neoplasm of breast: Secondary | ICD-10-CM

## 2012-04-04 ENCOUNTER — Ambulatory Visit (INDEPENDENT_AMBULATORY_CARE_PROVIDER_SITE_OTHER): Payer: 59 | Admitting: Internal Medicine

## 2012-04-04 ENCOUNTER — Encounter: Payer: Self-pay | Admitting: Internal Medicine

## 2012-04-04 VITALS — BP 120/80 | HR 71 | Temp 98.7°F | Ht 64.0 in | Wt 230.5 lb

## 2012-04-04 DIAGNOSIS — J309 Allergic rhinitis, unspecified: Secondary | ICD-10-CM

## 2012-04-04 DIAGNOSIS — I10 Essential (primary) hypertension: Secondary | ICD-10-CM

## 2012-04-04 DIAGNOSIS — J069 Acute upper respiratory infection, unspecified: Secondary | ICD-10-CM

## 2012-04-04 MED ORDER — AZITHROMYCIN 250 MG PO TABS: ORAL_TABLET | ORAL | Status: AC

## 2012-04-04 MED ORDER — FEXOFENADINE HCL 180 MG PO TABS: 180.0000 mg | ORAL_TABLET | Freq: Every day | ORAL | Status: DC

## 2012-04-04 NOTE — Assessment & Plan Note (Signed)
Mild to mod, for allegra prn course,  to f/u any worsening symptoms or concerns

## 2012-04-04 NOTE — Progress Notes (Signed)
Subjective:    Patient ID: [name] [name], female    DOB: [birth year], 41 y.o.   MRN: [id number]  HPI   Here with 3 days acute onset fever, facial pain, pressure, general weakness and malaise, and greenish d/c, with mild ST and left earache, with eye itch and  Does have several wks ongoing nasal allergy symptoms with clear congestion, itch and sneeze, without fever, pain, ST, cough or wheezing as well.  Pt denies chest pain, increased sob or doe, wheezing, orthopnea, PND, increased LE swelling, palpitations, dizziness or syncope. Pt denies new neurological symptoms such as new headache, or facial or extremity weakness or numbness   Pt denies polydipsia, polyuria.    Past Medical History  Diagnosis Date  . Chronic headache     Dr Vela Prose, Neg MRI 2005  . Anemia, iron deficiency   . HTN (hypertension)   . GERD (gastroesophageal reflux disease)   . Elevated glucose 2011  . Diabetes mellitus    Past Surgical History  Procedure Date  . Partial hysterectomy 2010  . Nose surgery   . Breast reduction surgery   . Cyst removed from vagina   . Induced abortion 1990-1994    x 4  . Tubal ligation     reports that she has never smoked. She does not have any smokeless tobacco history on file. She reports that she does not drink alcohol or use illicit drugs. family history includes Cancer (age of onset:59) in her mother and Hypertension in her maternal grandmother and mother. Allergies  Allergen Reactions  . Ciprofloxacin     REACTION: gittery  . Nitrofurantoin    Current Outpatient Prescriptions on File Prior to Visit  Medication Sig Dispense Refill  . dexlansoprazole (DEXILANT) 60 MG capsule Take 60 mg by mouth every morning. For indigestion       . ketoconazole (NIZORAL) 2 % shampoo Apply 1 application topically 2 (two) times a week.        . Olmesartan-Amlodipine-HCTZ (TRIBENZOR) 40-5-25 MG TABS Take 1 tablet by mouth daily.  90 tablet  1  . Cholecalciferol 1000 UNITS tablet Take 1,000 Units  by mouth daily.        . fexofenadine (ALLEGRA) 180 MG tablet Take 1 tablet (180 mg total) by mouth daily.  30 tablet  2  . metFORMIN (GLUCOPHAGE) 500 MG tablet Take 1 tablet (500 mg total) by mouth 2 (two) times daily with a meal.  60 tablet  11  . metroNIDAZOLE (FLAGYL) 250 MG tablet Take 1 tablet (250 mg total) by mouth 4 (four) times daily.  20 tablet  0   Review of Systems Review of Systems  Constitutional: Negative for diaphoresis and unexpected weight change.   Respiratory: Negative for choking and stridor.   Gastrointestinal: Negative for vomiting and blood in stool.  Genitourinary: Negative for hematuria and decreased urine volume.  Musculoskeletal: Negative for gait problem.  Neurological: Negative for tremors and numbness.  Psychiatric/Behavioral: Negative for decreased concentration. The patient is not hyperactive.       Objective:   Physical Exam BP 120/80  Pulse 71  Temp(Src) 98.7 F (37.1 C) (Oral)  Ht 5\' 4"  (1.626 m)  Wt 230 lb 8 oz (104.554 kg)  BMI 39.57 kg/m2  SpO2 98% Physical Exam  VS noted, mild ill Constitutional: Pt appears well-developed and well-nourished.  HENT: Head: Normocephalic.  Right Ear: External ear normal.  Left Ear: External ear normal.  Bilat tm's mild erythema.  Sinus nontender.  Pharynx mild erythema Eyes:  Conjunctivae and EOM are normal. Pupils are equal, round, and reactive to light.  Neck: Normal range of motion. Neck supple.  Cardiovascular: Normal rate and regular rhythm.   Pulmonary/Chest: Effort normal and breath sounds normal.  Neurological: Pt is alert. No cranial nerve deficit. motor/gait intact Skin: Skin is warm. No erythema.  Psychiatric: Pt behavior is normal. Thought content normal.     Assessment & Plan:

## 2012-04-04 NOTE — Assessment & Plan Note (Signed)
Mild to mod, for antibx course,  to f/u any worsening symptoms or concerns 

## 2012-04-04 NOTE — Patient Instructions (Signed)
Take all new medications as prescribed Continue all other medications as before Please have the pharmacy call with any refills you may need to Dr Posey Rea You can also take Delsym OTC for cough, and/or Mucinex (or it's generic off brand) for congestion

## 2012-04-04 NOTE — Assessment & Plan Note (Signed)
stable overall by hx and exam, most recent data reviewed with pt, and pt to continue medical treatment as before  BP Readings from Last 3 Encounters:  04/04/12 120/80  12/19/11 156/100  06/16/11 126/86

## 2012-04-13 ENCOUNTER — Telehealth: Payer: Self-pay | Admitting: *Deleted

## 2012-04-13 NOTE — Telephone Encounter (Signed)
Pt was seen on 04/04/2012 for sore throat and ear infection and was ABX. Pt states that she is still having trouble with her ears and sore throat and wants to know if another ABX can be sent in or if she needs to come in for OV.

## 2012-04-15 MED ORDER — AZITHROMYCIN 250 MG PO TABS: ORAL_TABLET | ORAL | Status: AC

## 2012-04-15 MED ORDER — FLUCONAZOLE 150 MG PO TABS: 150.0000 mg | ORAL_TABLET | Freq: Once | ORAL | Status: AC

## 2012-04-15 NOTE — Telephone Encounter (Signed)
Left message for pt to callback office.  

## 2012-04-15 NOTE — Telephone Encounter (Signed)
Ok to ref the abx Thx 

## 2012-04-15 NOTE — Telephone Encounter (Signed)
rx sent

## 2012-04-15 NOTE — Telephone Encounter (Signed)
Rx sent for ABX per Dr. Posey Rea, pt informed.. Pt also wants rx for Diflucan because she thinks previous ABX is causing a yeast infection-please advise in AVP's absence.

## 2012-04-18 NOTE — Telephone Encounter (Signed)
Pt informed

## 2012-04-19 ENCOUNTER — Ambulatory Visit: Payer: 59 | Attending: Sports Medicine | Admitting: Physical Therapy

## 2012-04-19 DIAGNOSIS — IMO0001 Reserved for inherently not codable concepts without codable children: Secondary | ICD-10-CM | POA: Insufficient documentation

## 2012-04-19 DIAGNOSIS — M25619 Stiffness of unspecified shoulder, not elsewhere classified: Secondary | ICD-10-CM | POA: Insufficient documentation

## 2012-04-19 DIAGNOSIS — M25519 Pain in unspecified shoulder: Secondary | ICD-10-CM | POA: Insufficient documentation

## 2012-04-27 ENCOUNTER — Ambulatory Visit: Payer: 59 | Admitting: Physical Therapy

## 2012-04-29 ENCOUNTER — Ambulatory Visit: Payer: 59 | Admitting: Physical Therapy

## 2012-05-03 ENCOUNTER — Ambulatory Visit: Payer: 59 | Admitting: Physical Therapy

## 2012-05-05 ENCOUNTER — Ambulatory Visit: Payer: 59 | Admitting: Physical Therapy

## 2012-05-10 ENCOUNTER — Ambulatory Visit: Payer: 59 | Admitting: Physical Therapy

## 2012-07-04 ENCOUNTER — Other Ambulatory Visit: Payer: Self-pay | Admitting: Internal Medicine

## 2012-07-05 ENCOUNTER — Telehealth: Payer: Self-pay | Admitting: *Deleted

## 2012-07-05 DIAGNOSIS — Z0000 Encounter for general adult medical examination without abnormal findings: Secondary | ICD-10-CM

## 2012-07-05 NOTE — Telephone Encounter (Signed)
07/26/12 CPE labs entered.

## 2012-07-05 NOTE — Telephone Encounter (Signed)
Message copied by Merrilyn Puma on Tue Jul 05, 2012  8:47 AM ------      Message from: Etheleen Sia      Created: Mon Jul 04, 2012  9:28 AM      Regarding: PHYSICAL LABS       PHYSICAL LABS FOR AUG 71

## 2012-07-21 ENCOUNTER — Other Ambulatory Visit (INDEPENDENT_AMBULATORY_CARE_PROVIDER_SITE_OTHER): Payer: 59

## 2012-07-21 DIAGNOSIS — Z0000 Encounter for general adult medical examination without abnormal findings: Secondary | ICD-10-CM

## 2012-07-21 LAB — URINALYSIS, ROUTINE W REFLEX MICROSCOPIC
Bilirubin Urine: NEGATIVE
Ketones, ur: NEGATIVE
Nitrite: NEGATIVE
Specific Gravity, Urine: 1.015 (ref 1.000–1.030)
pH: 7 (ref 5.0–8.0)

## 2012-07-21 LAB — CBC WITH DIFFERENTIAL/PLATELET
Basophils Absolute: 0.1 10*3/uL (ref 0.0–0.1)
Basophils Relative: 0.4 % (ref 0.0–3.0)
Eosinophils Relative: 1.2 % (ref 0.0–5.0)
HCT: 40 % (ref 36.0–46.0)
Hemoglobin: 13.2 g/dL (ref 12.0–15.0)
Lymphs Abs: 2.7 10*3/uL (ref 0.7–4.0)
MCHC: 33 g/dL (ref 30.0–36.0)
Monocytes Absolute: 1.1 10*3/uL — ABNORMAL HIGH (ref 0.1–1.0)
Monocytes Relative: 7.8 % (ref 3.0–12.0)
Neutro Abs: 9.6 10*3/uL — ABNORMAL HIGH (ref 1.4–7.7)
RBC: 4.54 Mil/uL (ref 3.87–5.11)
RDW: 14.4 % (ref 11.5–14.6)

## 2012-07-21 LAB — LIPID PANEL
LDL Cholesterol: 122 mg/dL — ABNORMAL HIGH (ref 0–99)
Total CHOL/HDL Ratio: 4
VLDL: 13.4 mg/dL (ref 0.0–40.0)

## 2012-07-21 LAB — BASIC METABOLIC PANEL
Calcium: 9 mg/dL (ref 8.4–10.5)
Chloride: 99 mEq/L (ref 96–112)
GFR: 120.23 mL/min (ref >60.00)
Glucose, Bld: 119 mg/dL — ABNORMAL HIGH (ref 70–99)
Potassium: 3.9 mEq/L (ref 3.5–5.1)
Sodium: 136 mEq/L (ref 135–145)

## 2012-07-21 LAB — TSH: TSH: 1.86 u[IU]/mL (ref 0.35–5.50)

## 2012-07-21 LAB — HEPATIC FUNCTION PANEL
AST: 16 U/L (ref 0–37)
Alkaline Phosphatase: 59 U/L (ref 39–117)
Bilirubin, Direct: 0.1 mg/dL (ref 0.0–0.3)
Total Bilirubin: 0.4 mg/dL (ref 0.3–1.2)

## 2012-07-26 ENCOUNTER — Ambulatory Visit (INDEPENDENT_AMBULATORY_CARE_PROVIDER_SITE_OTHER): Payer: 59 | Admitting: Internal Medicine

## 2012-07-26 ENCOUNTER — Encounter: Payer: Self-pay | Admitting: Internal Medicine

## 2012-07-26 VITALS — BP 112/82 | HR 85 | Temp 98.4°F | Ht 63.5 in | Wt 234.0 lb

## 2012-07-26 DIAGNOSIS — I10 Essential (primary) hypertension: Secondary | ICD-10-CM

## 2012-07-26 DIAGNOSIS — E669 Obesity, unspecified: Secondary | ICD-10-CM

## 2012-07-26 DIAGNOSIS — K219 Gastro-esophageal reflux disease without esophagitis: Secondary | ICD-10-CM

## 2012-07-26 DIAGNOSIS — Z0000 Encounter for general adult medical examination without abnormal findings: Secondary | ICD-10-CM

## 2012-07-26 DIAGNOSIS — E119 Type 2 diabetes mellitus without complications: Secondary | ICD-10-CM

## 2012-07-26 MED ORDER — METFORMIN HCL 500 MG PO TABS: 500.0000 mg | ORAL_TABLET | Freq: Two times a day (BID) | ORAL | Status: DC

## 2012-07-26 MED ORDER — OLMESARTAN-AMLODIPINE-HCTZ 40-5-25 MG PO TABS: 1.0000 | ORAL_TABLET | Freq: Every day | ORAL | Status: DC

## 2012-07-26 MED ORDER — DEXLANSOPRAZOLE 60 MG PO CPDR: 60.0000 mg | DELAYED_RELEASE_CAPSULE | ORAL | Status: DC

## 2012-07-26 NOTE — Assessment & Plan Note (Signed)
Discussed.

## 2012-07-26 NOTE — Assessment & Plan Note (Signed)
We discussed age appropriate health related issues, including available/recomended screening tests and vaccinations. We discussed a need for adhering to healthy diet and exercise. Labs/EKG were reviewed/ordered. All questions were answered.   

## 2012-07-26 NOTE — Progress Notes (Signed)
Subjective:    Patient ID: [name] [name], female    DOB: [birth year], 41 y.o.   MRN: [id number]  HPI  The patient is here for a wellness exam. The patient has been doing well overall without major physical or psychological issues going on lately. The patient needs to address  chronic hypertension that has been well controlled with medicines; to address chronic  hyperlipidemia controlled with medicines as well; and to address type 2 chronic diabetes, controlled with medical treatment and diet.   Review of Systems  Constitutional: Negative for chills, activity change, appetite change, fatigue and unexpected weight change.  HENT: Negative for congestion, mouth sores, trouble swallowing, voice change and sinus pressure.   Eyes: Negative for visual disturbance.  Respiratory: Positive for chest tightness. Negative for cough, shortness of breath, wheezing and stridor.   Cardiovascular: Negative for palpitations and leg swelling.  Gastrointestinal: Negative for nausea, vomiting, abdominal pain and rectal pain.       GERD - mild   Genitourinary: Negative for urgency, frequency, hematuria, flank pain, decreased urine volume, difficulty urinating and vaginal pain.  Musculoskeletal: Negative for myalgias, back pain and gait problem.  Skin: Negative for pallor, rash and wound.  Neurological: Negative for dizziness, tremors, weakness, numbness and headaches.  Hematological: Negative for adenopathy.  Psychiatric/Behavioral: Negative for confusion, disturbed wake/sleep cycle, decreased concentration and agitation. The patient is not nervous/anxious.    Wt Readings from Last 3 Encounters:  07/26/12 234 lb (106.142 kg)  04/04/12 230 lb 8 oz (104.554 kg)  12/19/11 235 lb (106.595 kg)   BP Readings from Last 3 Encounters:  07/26/12 112/82  04/04/12 120/80  12/19/11 156/100         Objective:   Physical Exam  Constitutional: She appears well-developed. No distress.       Obese   HENT:  Head:  Normocephalic.  Right Ear: External ear normal.  Left Ear: External ear normal.  Nose: Nose normal.  Mouth/Throat: Oropharynx is clear and moist.  Eyes: Conjunctivae are normal. Pupils are equal, round, and reactive to light. Right eye exhibits no discharge. Left eye exhibits no discharge.  Neck: Normal range of motion. Neck supple. No JVD present. No tracheal deviation present. No thyromegaly present.  Cardiovascular: Normal rate, regular rhythm and normal heart sounds.   Pulmonary/Chest: No stridor. No respiratory distress. She has no wheezes.  Abdominal: Soft. Bowel sounds are normal. She exhibits no distension and no mass. There is no tenderness. There is no rebound and no guarding.  Musculoskeletal: She exhibits no edema and no tenderness.  Lymphadenopathy:    She has no cervical adenopathy.  Neurological: She displays normal reflexes. No cranial nerve deficit. She exhibits normal muscle tone. Coordination normal.  Skin: No rash noted. No erythema.  Psychiatric: She has a normal mood and affect. Her behavior is normal. Judgment and thought content normal.    Lab Results  Component Value Date   WBC 13.6* 07/21/2012   HGB 13.2 07/21/2012   HCT 40.0 07/21/2012   PLT 308.0 07/21/2012   GLUCOSE 119* 07/21/2012   CHOL 189 07/21/2012   TRIG 67.0 07/21/2012   HDL 54.00 07/21/2012   LDLDIRECT 137.4 11/04/2010   LDLCALC 122* 07/21/2012   ALT 19 07/21/2012   AST 16 07/21/2012   NA 136 07/21/2012   K 3.9 07/21/2012   CL 99 07/21/2012   CREATININE 0.7 07/21/2012   BUN 12 07/21/2012   CO2 29 07/21/2012   TSH 1.86 07/21/2012   HGBA1C 6.9* 06/05/2011  Assessment & Plan:

## 2012-07-26 NOTE — Assessment & Plan Note (Addendum)
Continue with current prescription therapy as reflected on the Med list.  

## 2012-07-26 NOTE — Assessment & Plan Note (Signed)
Continue with current prescription therapy as reflected on the Med list.  

## 2012-08-16 ENCOUNTER — Encounter (HOSPITAL_COMMUNITY): Payer: Self-pay

## 2012-08-16 ENCOUNTER — Emergency Department (HOSPITAL_COMMUNITY)
Admission: EM | Admit: 2012-08-16 | Discharge: 2012-08-16 | Disposition: A | Discharge status: Home / Self Care | Payer: 59 | Source: Home / Self Care | Attending: Family Medicine | Admitting: Family Medicine

## 2012-08-16 DIAGNOSIS — J069 Acute upper respiratory infection, unspecified: Secondary | ICD-10-CM

## 2012-08-16 DIAGNOSIS — H109 Unspecified conjunctivitis: Secondary | ICD-10-CM

## 2012-08-16 MED ORDER — POLYMYXIN B-TRIMETHOPRIM 10000-0.1 UNIT/ML-% OP SOLN: 1.0000 [drp] | OPHTHALMIC | Status: AC

## 2012-08-16 MED ORDER — SALINE NASAL SPRAY 0.65 % NA SOLN: 1.0000 | NASAL | Status: DC | PRN

## 2012-08-16 MED ORDER — CETIRIZINE-PSEUDOEPHEDRINE ER 5-120 MG PO TB12: 1.0000 | ORAL_TABLET | Freq: Two times a day (BID) | ORAL | Status: AC

## 2012-08-16 NOTE — ED Provider Notes (Signed)
History     CSN: [id number]  Arrival date & time 08/16/12  1820   First MD Initiated Contact with Patient 08/16/12 2007      Chief Complaint  Patient presents with  . Eye Problem    3 day hx of eye redness, itchy .  noted whitish drainage as well.  pt. has tried eye drops without relief.     (Consider location/radiation/quality/duration/timing/severity/associated sxs/prior treatment) Patient is a 41 y.o. female presenting with eye problem. The history is provided by the patient.  Eye Problem  Associated symptoms include discharge, photophobia and eye redness.  Patient reports left pain and redness for three days.  States has used OTC alaway with minimal relief in symptoms.  Denies previous history of same.  Not diabetic, no previous eye surgery/trauma, denies cataracts or glaucoma. No floaters or flashing lights No vision loss + blurred vision + eye pain No eyelid itching + tearing No headache/scalp tenderness Does not wear contacts, but wears glasses. Patient additionally complains of URI symptom Past Medical History  Diagnosis Date  . Chronic headache     Dr Vela Prose, Neg MRI 2005  . Anemia, iron deficiency   . HTN (hypertension)   . GERD (gastroesophageal reflux disease)   . Elevated glucose 2011  . Diabetes mellitus     Past Surgical History  Procedure Date  . Partial hysterectomy 2010  . Nose surgery   . Breast reduction surgery   . Cyst removed from vagina   . Induced abortion 1990-1994    x 4  . Tubal ligation     Family History  Problem Relation Age of Onset  . Hypertension Mother   . Cancer Mother 68    Esophageal  . Hypertension Maternal Grandmother     History  Substance Use Topics  . Smoking status: Never Smoker   . Smokeless tobacco: Not on file  . Alcohol Use: No    OB History    Grav Para Term Preterm Abortions TAB SAB Ect Mult Living                  Review of Systems  Constitutional: Positive for chills and fatigue. Negative for  fever, activity change and appetite change.  HENT: Positive for ear pain, congestion, sore throat, rhinorrhea, sneezing, voice change, postnasal drip and sinus pressure. Negative for hearing loss, nosebleeds, neck pain, neck stiffness, tinnitus and ear discharge.   Eyes: Positive for photophobia, pain, discharge and redness. Negative for itching and visual disturbance.  Respiratory: Positive for chest tightness.   Cardiovascular: Negative.     Allergies  Ciprofloxacin  Home Medications   Current Outpatient Rx  Name Route Sig Dispense Refill  . DEXLANSOPRAZOLE 60 MG PO CPDR Oral Take 1 capsule (60 mg total) by mouth every morning. For indigestion 30 capsule 11  . METFORMIN HCL 500 MG PO TABS Oral Take 1 tablet (500 mg total) by mouth 2 (two) times daily with a meal. 60 tablet 11  . OLMESARTAN-AMLODIPINE-HCTZ 40-5-25 MG PO TABS Per post-pyloric tube 1 tablet by Per post-pyloric tube route daily. 30 tablet 11  . CETIRIZINE-PSEUDOEPHEDRINE ER 5-120 MG PO TB12 Oral Take 1 tablet by mouth 2 (two) times daily. 30 tablet 0  . CHOLECALCIFEROL 1000 UNITS PO TABS Oral Take 1,000 Units by mouth daily.      Marland Kitchen FEXOFENADINE HCL 180 MG PO TABS Oral Take 1 tablet (180 mg total) by mouth daily. 30 tablet 2  . KETOCONAZOLE 2 % EX SHAM Topical Apply 1  application topically 2 (two) times a week.      Marland Kitchen SALINE NASAL SPRAY 0.65 % NA SOLN Nasal Place 1 spray into the nose as needed for congestion. 30 mL 12  . POLYMYXIN B-TRIMETHOPRIM 10000-0.1 UNIT/ML-% OP SOLN Left Eye Place 1 drop into the left eye every 4 (four) hours. 10 mL 0    BP 161/98  Pulse 80  Temp 98 F (36.7 C) (Oral)  Resp 18  SpO2 97%  Physical Exam  Nursing note and vitals reviewed. Constitutional: She is oriented to person, place, and time. Vital signs are normal. She appears well-developed and well-nourished. She is active and cooperative.  HENT:  Head: Normocephalic.  Right Ear: Hearing, tympanic membrane, external ear and ear canal  normal.  Left Ear: Hearing, tympanic membrane, external ear and ear canal normal.  Nose: Nose normal. Right sinus exhibits no maxillary sinus tenderness and no frontal sinus tenderness. Left sinus exhibits no maxillary sinus tenderness and no frontal sinus tenderness.  Mouth/Throat: Uvula is midline, oropharynx is clear and moist and mucous membranes are normal. No oropharyngeal exudate, posterior oropharyngeal edema or posterior oropharyngeal erythema.  Eyes: EOM are normal. Pupils are equal, round, and reactive to light. Right eye exhibits no discharge and no exudate. No foreign body present in the right eye. Left eye exhibits no discharge and no exudate. No foreign body present in the left eye. Left conjunctiva is injected. No scleral icterus.  Neck: Trachea normal and normal range of motion. Neck supple. No muscular tenderness present.  Cardiovascular: Normal rate, regular rhythm, normal heart sounds and normal pulses.   Pulmonary/Chest: Effort normal and breath sounds normal. She exhibits no tenderness.  Lymphadenopathy:       Head (right side): Tonsillar adenopathy present. No submental, no submandibular, no preauricular, no posterior auricular and no occipital adenopathy present.       Head (left side): No submental, no submandibular, no tonsillar, no preauricular, no posterior auricular and no occipital adenopathy present.    She has no cervical adenopathy.  Neurological: She is alert and oriented to person, place, and time. No cranial nerve deficit or sensory deficit.  Skin: Skin is warm and dry.  Psychiatric: She has a normal mood and affect. Her speech is normal and behavior is normal. Judgment and thought content normal. Cognition and memory are normal.    ED Course  Procedures (including critical care time)  Labs Reviewed - No data to display No results found.   1. URI (upper respiratory infection)   2. Conjunctivitis       MDM Apply warm compresses four times daily.  Discontinue the use of eye makeup as well as eye lotions and creams because they may be infected, obtain new.  Seek follow-up care with an ophthalmologist within 2-3 days if the condition is not resolved completely with prescribed management.  Increase fluid intake, rest.  Begin saline nasal spray and/or saline irrigation, and cough suppressant at bedtime. Antihistamines of your choice (Claritin or Zyrtec).  Tylenol or Motrin for fever/discomfort.  Followup with PCP if not improving 7 to 10 days.          Johnsie Kindred, NP 08/16/12 2102

## 2012-08-16 NOTE — ED Notes (Signed)
3 day hx of eye redness, itchy .  noted whitish drainage as well.  pt. has tried eye drops without relief.

## 2012-08-17 NOTE — ED Provider Notes (Signed)
Medical screening examination/treatment/procedure(s) were performed by resident physician or non-physician practitioner and as supervising physician I was immediately available for consultation/collaboration.   Aahana Elza DOUGLAS MD.    Dwyne Hasegawa D Tyrek Lawhorn, MD 08/17/12 2130 

## 2012-10-07 ENCOUNTER — Encounter (HOSPITAL_COMMUNITY): Payer: Self-pay | Admitting: *Deleted

## 2012-10-07 ENCOUNTER — Emergency Department (HOSPITAL_COMMUNITY)
Admission: EM | Admit: 2012-10-07 | Discharge: 2012-10-07 | Disposition: A | Discharge status: Home / Self Care | Payer: 59 | Source: Home / Self Care

## 2012-10-07 DIAGNOSIS — B029 Zoster without complications: Secondary | ICD-10-CM

## 2012-10-07 MED ORDER — METHYLPREDNISOLONE 4 MG PO KIT: PACK | ORAL | Status: DC

## 2012-10-07 MED ORDER — VALACYCLOVIR HCL 1 G PO TABS: 1000.0000 mg | ORAL_TABLET | Freq: Three times a day (TID) | ORAL | Status: AC

## 2012-10-07 NOTE — ED Notes (Signed)
Pt called needing pain prescription - informed that it would be called in to pharmacy as requested

## 2012-10-07 NOTE — ED Notes (Signed)
Prescription called in to [facility] on [address] - Norco #25 1 po q 4 hrs for pain

## 2012-10-07 NOTE — ED Provider Notes (Signed)
History     CSN: [id number]  Arrival date & time 10/07/12  1027   None     Chief Complaint  Patient presents with  . Rash    (Consider location/radiation/quality/duration/timing/severity/associated sxs/prior treatment) HPI Comments: 41 year old female who developed a rash on the forehead adjacent to the hairline on the right side 2 days ago. There is also one to 2 wheals posterior to the right angle of the jaw all is well as in the right preauricular space. These areas have mild itching but more pain and tenderness. There are no lesions left of the midline. She denies systemic illness such as fever or malaise.   Past Medical History  Diagnosis Date  . Chronic headache     Dr Vela Prose, Neg MRI 2005  . Anemia, iron deficiency   . HTN (hypertension)   . GERD (gastroesophageal reflux disease)   . Elevated glucose 2011  . Diabetes mellitus     Past Surgical History  Procedure Date  . Partial hysterectomy 2010  . Nose surgery   . Breast reduction surgery   . Cyst removed from vagina   . Induced abortion 1990-1994    x 4  . Tubal ligation   . Abdominal hysterectomy     Family History  Problem Relation Age of Onset  . Hypertension Mother   . Cancer Mother 8    Esophageal  . Hypertension Maternal Grandmother     History  Substance Use Topics  . Smoking status: Never Smoker   . Smokeless tobacco: Not on file  . Alcohol Use: No    OB History    Grav Para Term Preterm Abortions TAB SAB Ect Mult Living                  Review of Systems  Constitutional: Negative for fever, chills and activity change.  HENT: Negative.   Eyes: Negative for pain, discharge, redness and itching.  Respiratory: Negative.   Cardiovascular: Negative.   Musculoskeletal: Negative.   Skin: Positive for color change and rash. Negative for pallor.       See the history of present illness  Neurological: Negative.     Allergies  Ciprofloxacin  Home Medications   Current Outpatient  Rx  Name Route Sig Dispense Refill  . CETIRIZINE-PSEUDOEPHEDRINE ER 5-120 MG PO TB12 Oral Take 1 tablet by mouth 2 (two) times daily. 30 tablet 0  . CHOLECALCIFEROL 1000 UNITS PO TABS Oral Take 1,000 Units by mouth daily.      . DEXLANSOPRAZOLE 60 MG PO CPDR Oral Take 1 capsule (60 mg total) by mouth every morning. For indigestion 30 capsule 11  . FEXOFENADINE HCL 180 MG PO TABS Oral Take 1 tablet (180 mg total) by mouth daily. 30 tablet 2  . KETOCONAZOLE 2 % EX SHAM Topical Apply 1 application topically 2 (two) times a week.      . METFORMIN HCL 500 MG PO TABS Oral Take 1 tablet (500 mg total) by mouth 2 (two) times daily with a meal. 60 tablet 11  . METHYLPREDNISOLONE 4 MG PO KIT  follow package directions 21 tablet 0  . OLMESARTAN-AMLODIPINE-HCTZ 40-5-25 MG PO TABS Per post-pyloric tube 1 tablet by Per post-pyloric tube route daily. 30 tablet 11  . SALINE NASAL SPRAY 0.65 % NA SOLN Nasal Place 1 spray into the nose as needed for congestion. 30 mL 12  . VALACYCLOVIR HCL 1 G PO TABS Oral Take 1 tablet (1,000 mg total) by mouth 3 (three) times daily.  21 tablet 0    BP 152/104  Pulse 72  Temp 98.6 F (37 C) (Oral)  Resp 18  SpO2 100%  Physical Exam  Constitutional: She is oriented to person, place, and time. She appears well-developed and well-nourished. No distress.  HENT:  Head: Normocephalic and atraumatic.  Eyes: EOM are normal. Pupils are equal, round, and reactive to light.  Neck: Normal range of motion. Neck supple.  Pulmonary/Chest: Effort normal.  Musculoskeletal: Normal range of motion. She exhibits no edema.  Lymphadenopathy:    She has no cervical adenopathy.  Neurological: She is alert and oriented to person, place, and time. No cranial nerve deficit.  Skin: Skin is warm and dry.       Red raised area of the right forehead just below the hairline. Mildly to moderately tender. Small papular vesicular lesions over this red base. 2 other similar lesions but smaller right  preauricular and right   space posterior to the angle of the jaw  Psychiatric: She has a normal mood and affect.    ED Course  Procedures (including critical care time)  Labs Reviewed - No data to display No results found.   1. Herpes zoster       MDM  Valtrex 1000mg  tid or 7 d Medrol dose pack. Follow with your PCP next week.  The differential for this may include contact dermatitis as she has recently died her hair and has a greasy substance topically. There are no lesions left of the midline. And the lesions didn't to be more tender than itchy. If the lesions turn out to be strictly a contact dermatitis then the dosepak should help with this.        Hayden Rasmussen, NP 10/07/12 [phone]

## 2012-10-07 NOTE — ED Notes (Signed)
Pt  Reports  Symptoms  Of a  Rash  On  r  Upper  Forehead      X  2  Days        She  Also  Reports sensation of  A  Tightness   Around  Her  r  Eye            She  denys  Any  New  meds     She  Displays  No  Angioedema    She  Is  In no  Acute  Distress  At this  Moment         She  Is  Sitting  Upright on  Exam table  Speaking in  Complete  sentances

## 2012-10-08 NOTE — ED Provider Notes (Signed)
Medical screening examination/treatment/procedure(s) were performed by resident physician or non-physician practitioner and as supervising physician I was immediately available for consultation/collaboration.   Barkley Bruns MD.    Linna Hoff, MD 10/08/12 [phone]

## 2012-10-13 ENCOUNTER — Ambulatory Visit (INDEPENDENT_AMBULATORY_CARE_PROVIDER_SITE_OTHER): Payer: 59 | Admitting: Internal Medicine

## 2012-10-13 ENCOUNTER — Encounter: Payer: Self-pay | Admitting: Internal Medicine

## 2012-10-13 VITALS — BP 110/60 | HR 76 | Temp 97.3°F | Resp 16 | Wt 232.0 lb

## 2012-10-13 DIAGNOSIS — I10 Essential (primary) hypertension: Secondary | ICD-10-CM

## 2012-10-13 DIAGNOSIS — E119 Type 2 diabetes mellitus without complications: Secondary | ICD-10-CM

## 2012-10-13 DIAGNOSIS — R51 Headache: Secondary | ICD-10-CM

## 2012-10-13 DIAGNOSIS — M545 Low back pain, unspecified: Secondary | ICD-10-CM

## 2012-10-13 DIAGNOSIS — B029 Zoster without complications: Secondary | ICD-10-CM

## 2012-10-13 MED ORDER — GABAPENTIN 100 MG PO CAPS: 100.0000 mg | ORAL_CAPSULE | Freq: Three times a day (TID) | ORAL | Status: DC

## 2012-10-13 NOTE — Assessment & Plan Note (Signed)
Continue with current prescription therapy as reflected on the Med list.  

## 2012-10-13 NOTE — Progress Notes (Signed)
  Subjective:    Patient ID: [name] [name], female    DOB: May 24, 1971, 41 y.o.   MRN: [id number]  HPI  C/o shingles diagnosed 1 wk ago - R forehead: on prednisone and Valtrex now - finishing now. HA on R is 5/10   Review of Systems  Constitutional: Negative for chills, activity change, appetite change, fatigue and unexpected weight change.  HENT: Negative for congestion, mouth sores, trouble swallowing, voice change and sinus pressure.   Eyes: Negative for visual disturbance.  Respiratory: Positive for chest tightness. Negative for cough, shortness of breath, wheezing and stridor.   Cardiovascular: Negative for palpitations and leg swelling.  Gastrointestinal: Negative for nausea, vomiting, abdominal pain and rectal pain.       GERD - mild   Genitourinary: Negative for urgency, frequency, hematuria, flank pain, decreased urine volume, difficulty urinating and vaginal pain.  Musculoskeletal: Negative for myalgias, back pain and gait problem.  Skin: Negative for pallor, rash and wound.  Neurological: Negative for dizziness, tremors, weakness, numbness and headaches.  Hematological: Negative for adenopathy.  Psychiatric/Behavioral: Negative for confusion, disturbed wake/sleep cycle, decreased concentration and agitation. The patient is not nervous/anxious.    Wt Readings from Last 3 Encounters:  10/13/12 232 lb (105.235 kg)  07/26/12 234 lb (106.142 kg)  04/04/12 230 lb 8 oz (104.554 kg)   BP Readings from Last 3 Encounters:  10/13/12 110/60  10/07/12 152/104  08/16/12 161/98         Objective:   Physical Exam  Constitutional: She appears well-developed. No distress.       Obese   HENT:  Head: Normocephalic.  Right Ear: External ear normal.  Left Ear: External ear normal.  Nose: Nose normal.  Mouth/Throat: Oropharynx is clear and moist.  Eyes: Conjunctivae normal are normal. Pupils are equal, round, and reactive to light. Right eye exhibits no discharge. Left eye  exhibits no discharge.  Neck: Normal range of motion. Neck supple. No JVD present. No tracheal deviation present. No thyromegaly present.  Cardiovascular: Normal rate, regular rhythm and normal heart sounds.   Pulmonary/Chest: No stridor. No respiratory distress. She has no wheezes.  Abdominal: Soft. Bowel sounds are normal. She exhibits no distension and no mass. There is no tenderness. There is no rebound and no guarding.  Musculoskeletal: She exhibits tenderness. She exhibits no edema.  Lymphadenopathy:    She has no cervical adenopathy.  Neurological: She displays normal reflexes. No cranial nerve deficit. She exhibits normal muscle tone. Coordination normal.  Skin: Rash noted. No erythema.       R forehead H zoster patch  Psychiatric: She has a normal mood and affect. Her behavior is normal. Judgment and thought content normal.    Lab Results  Component Value Date   WBC 13.6* 07/21/2012   HGB 13.2 07/21/2012   HCT 40.0 07/21/2012   PLT 308.0 07/21/2012   GLUCOSE 119* 07/21/2012   CHOL 189 07/21/2012   TRIG 67.0 07/21/2012   HDL 54.00 07/21/2012   LDLDIRECT 137.4 11/04/2010   LDLCALC 122* 07/21/2012   ALT 19 07/21/2012   AST 16 07/21/2012   NA 136 07/21/2012   K 3.9 07/21/2012   CL 99 07/21/2012   CREATININE 0.7 07/21/2012   BUN 12 07/21/2012   CO2 29 07/21/2012   TSH 1.86 07/21/2012   HGBA1C 6.9* 06/05/2011         Assessment & Plan:

## 2012-10-13 NOTE — Assessment & Plan Note (Signed)
Gabapentin prn 

## 2012-10-13 NOTE — Assessment & Plan Note (Signed)
Finishing Valtrex

## 2012-10-13 NOTE — Assessment & Plan Note (Signed)
She needs a high back chair

## 2012-10-16 ENCOUNTER — Encounter: Payer: Self-pay | Admitting: Internal Medicine

## 2013-01-24 ENCOUNTER — Other Ambulatory Visit (INDEPENDENT_AMBULATORY_CARE_PROVIDER_SITE_OTHER): Payer: 59

## 2013-01-24 DIAGNOSIS — I10 Essential (primary) hypertension: Secondary | ICD-10-CM

## 2013-01-24 DIAGNOSIS — E119 Type 2 diabetes mellitus without complications: Secondary | ICD-10-CM

## 2013-01-24 DIAGNOSIS — K219 Gastro-esophageal reflux disease without esophagitis: Secondary | ICD-10-CM

## 2013-01-24 DIAGNOSIS — E669 Obesity, unspecified: Secondary | ICD-10-CM

## 2013-01-24 LAB — BASIC METABOLIC PANEL
CO2: 32 mEq/L (ref 19–32)
GFR: 98.27 mL/min (ref >60.00)
Glucose, Bld: 130 mg/dL — ABNORMAL HIGH (ref 70–99)
Potassium: 3.7 mEq/L (ref 3.5–5.1)
Sodium: 137 mEq/L (ref 135–145)

## 2013-01-27 ENCOUNTER — Ambulatory Visit: Payer: 59 | Admitting: Internal Medicine

## 2013-01-28 ENCOUNTER — Other Ambulatory Visit: Payer: Self-pay

## 2013-01-30 ENCOUNTER — Ambulatory Visit (INDEPENDENT_AMBULATORY_CARE_PROVIDER_SITE_OTHER): Payer: 59 | Admitting: Internal Medicine

## 2013-01-30 ENCOUNTER — Encounter: Payer: Self-pay | Admitting: Internal Medicine

## 2013-01-30 VITALS — BP 132/100 | HR 80 | Temp 98.0°F | Resp 16 | Wt 237.0 lb

## 2013-01-30 DIAGNOSIS — R51 Headache: Secondary | ICD-10-CM

## 2013-01-30 DIAGNOSIS — D509 Iron deficiency anemia, unspecified: Secondary | ICD-10-CM

## 2013-01-30 DIAGNOSIS — I10 Essential (primary) hypertension: Secondary | ICD-10-CM

## 2013-01-30 DIAGNOSIS — E119 Type 2 diabetes mellitus without complications: Secondary | ICD-10-CM

## 2013-01-30 MED ORDER — METFORMIN HCL 1000 MG PO TABS: 1000.0000 mg | ORAL_TABLET | Freq: Two times a day (BID) | ORAL | Status: DC

## 2013-01-30 MED ORDER — OLMESARTAN-AMLODIPINE-HCTZ 40-5-25 MG PO TABS: 1.0000 | ORAL_TABLET | Freq: Every day | ORAL | Status: DC

## 2013-01-30 MED ORDER — DEXLANSOPRAZOLE 60 MG PO CPDR: 60.0000 mg | DELAYED_RELEASE_CAPSULE | ORAL | Status: DC

## 2013-01-30 NOTE — Assessment & Plan Note (Addendum)
Increase metformin RTC 3 mo

## 2013-01-30 NOTE — Assessment & Plan Note (Signed)
Continue with current prescription therapy as reflected on the Med list.  

## 2013-01-30 NOTE — Progress Notes (Signed)
  Subjective:    HPI  The patient needs to address  chronic hypertension that has been well controlled with medicines; to address chronic  hyperlipidemia controlled with medicines as well; and to address type 2 chronic diabetes, controlled with medical treatment and diet.   Review of Systems  Constitutional: Negative for chills, activity change, appetite change, fatigue and unexpected weight change.  HENT: Negative for congestion, mouth sores, trouble swallowing, voice change and sinus pressure.   Eyes: Negative for visual disturbance.  Respiratory: Positive for chest tightness. Negative for cough, shortness of breath, wheezing and stridor.   Cardiovascular: Negative for palpitations and leg swelling.  Gastrointestinal: Negative for nausea, vomiting, abdominal pain and rectal pain.       GERD - mild   Genitourinary: Negative for urgency, frequency, hematuria, flank pain, decreased urine volume, difficulty urinating and vaginal pain.  Musculoskeletal: Negative for myalgias, back pain and gait problem.  Skin: Negative for pallor, rash and wound.  Neurological: Negative for dizziness, tremors, weakness, numbness and headaches.  Hematological: Negative for adenopathy.  Psychiatric/Behavioral: Negative for confusion, sleep disturbance, decreased concentration and agitation. The patient is not nervous/anxious.    Wt Readings from Last 3 Encounters:  01/30/13 237 lb (107.502 kg)  10/13/12 232 lb (105.235 kg)  07/26/12 234 lb (106.142 kg)   BP Readings from Last 3 Encounters:  01/30/13 132/100  10/13/12 110/60  10/07/12 152/104         Objective:   Physical Exam  Constitutional: She appears well-developed. No distress.  Obese   HENT:  Head: Normocephalic.  Right Ear: External ear normal.  Left Ear: External ear normal.  Nose: Nose normal.  Mouth/Throat: Oropharynx is clear and moist.  Eyes: Conjunctivae are normal. Pupils are equal, round, and reactive to light. Right eye  exhibits no discharge. Left eye exhibits no discharge.  Neck: Normal range of motion. Neck supple. No JVD present. No tracheal deviation present. No thyromegaly present.  Cardiovascular: Normal rate, regular rhythm and normal heart sounds.   Pulmonary/Chest: No stridor. No respiratory distress. She has no wheezes.  Abdominal: Soft. Bowel sounds are normal. She exhibits no distension and no mass. There is no tenderness. There is no rebound and no guarding.  Musculoskeletal: She exhibits no edema and no tenderness.  Lymphadenopathy:    She has no cervical adenopathy.  Neurological: She displays normal reflexes. No cranial nerve deficit. She exhibits normal muscle tone. Coordination normal.  Skin: No rash noted. No erythema.  Psychiatric: She has a normal mood and affect. Her behavior is normal. Judgment and thought content normal.    Lab Results  Component Value Date   WBC 13.6* 07/21/2012   HGB 13.2 07/21/2012   HCT 40.0 07/21/2012   PLT 308.0 07/21/2012   GLUCOSE 130* 01/24/2013   CHOL 189 07/21/2012   TRIG 67.0 07/21/2012   HDL 54.00 07/21/2012   LDLDIRECT 137.4 11/04/2010   LDLCALC 122* 07/21/2012   ALT 19 07/21/2012   AST 16 07/21/2012   NA 137 01/24/2013   K 3.7 01/24/2013   CL 97 01/24/2013   CREATININE 0.8 01/24/2013   BUN 13 01/24/2013   CO2 32 01/24/2013   TSH 1.86 07/21/2012   HGBA1C 8.3* 01/24/2013         Assessment & Plan:

## 2013-01-30 NOTE — Assessment & Plan Note (Signed)
2/14 pos mild concussion CT

## 2013-02-01 ENCOUNTER — Encounter: Payer: Self-pay | Admitting: Internal Medicine

## 2013-02-01 NOTE — Assessment & Plan Note (Signed)
Continue with current prescription therapy as reflected on the Med list.  

## 2013-02-06 ENCOUNTER — Telehealth: Payer: Self-pay | Admitting: Internal Medicine

## 2013-02-06 NOTE — Telephone Encounter (Signed)
pt was not approve for CT [facility] is recommending a Mri of the brain instead pls advise

## 2013-02-09 ENCOUNTER — Telehealth: Payer: Self-pay | Admitting: *Deleted

## 2013-02-09 ENCOUNTER — Other Ambulatory Visit: Payer: Self-pay | Admitting: *Deleted

## 2013-02-09 DIAGNOSIS — R51 Headache: Secondary | ICD-10-CM

## 2013-02-09 NOTE — Telephone Encounter (Signed)
Is it for the HA? Thx

## 2013-02-09 NOTE — Telephone Encounter (Signed)
Yes

## 2013-02-09 NOTE — Telephone Encounter (Signed)
Pt is calling stating Ibuprofen isn't helping. She wants to know what else she can take? Please advise

## 2013-02-10 MED ORDER — BUTALBITAL-ACETAMINOPHEN 50-300 MG PO TABS: 1.0000 | ORAL_TABLET | Freq: Two times a day (BID) | ORAL | Status: DC | PRN

## 2013-02-10 NOTE — Telephone Encounter (Signed)
Ok BUPAP prn Thx

## 2013-02-10 NOTE — Telephone Encounter (Signed)
Left detailed mess informing pt of below. Rx phoned in.

## 2013-02-10 NOTE — Addendum Note (Signed)
Addended by: Tresa Garter on: 02/10/2013 07:52 AM   Modules accepted: Orders, Medications

## 2013-02-13 ENCOUNTER — Ambulatory Visit
Admission: RE | Admit: 2013-02-13 | Discharge: 2013-02-13 | Disposition: A | Discharge status: Home / Self Care | Payer: 59 | Source: Ambulatory Visit | Attending: Internal Medicine | Admitting: Internal Medicine

## 2013-02-14 ENCOUNTER — Other Ambulatory Visit: Payer: Self-pay | Admitting: Internal Medicine

## 2013-02-14 ENCOUNTER — Telehealth: Payer: Self-pay | Admitting: Internal Medicine

## 2013-02-14 MED ORDER — BUTALBITAL-APAP-CAFFEINE 50-325-40 MG PO TABS: 1.0000 | ORAL_TABLET | Freq: Two times a day (BID) | ORAL | Status: DC | PRN

## 2013-02-14 MED ORDER — METFORMIN HCL 500 MG PO TABS: 1000.0000 mg | ORAL_TABLET | Freq: Two times a day (BID) | ORAL | Status: DC

## 2013-02-14 NOTE — Telephone Encounter (Signed)
Fioricet prn Thx

## 2013-02-14 NOTE — Telephone Encounter (Signed)
Pt picked up her RX for Metforman 1000 mg.  She wants it changed back to 500 mg for the next time it is due because the 1000 are too large and difficult to swallow.

## 2013-02-14 NOTE — Telephone Encounter (Signed)
Pt informed. She states the Bupap wasn't covered so she didn't get them. Is there anything else she can try?

## 2013-02-14 NOTE — Telephone Encounter (Signed)
Ok metformin 500 mg 2 bid Thx

## 2013-02-14 NOTE — Telephone Encounter (Signed)
Pt informed

## 2013-02-14 NOTE — Telephone Encounter (Signed)
[name] [name], please, inform patient that her MRI was normal Thx

## 2013-02-16 ENCOUNTER — Encounter: Payer: Self-pay | Admitting: *Deleted

## 2013-02-16 ENCOUNTER — Encounter: Payer: 59 | Attending: Internal Medicine | Admitting: *Deleted

## 2013-02-16 VITALS — Ht 64.0 in | Wt 236.9 lb

## 2013-02-16 DIAGNOSIS — E119 Type 2 diabetes mellitus without complications: Secondary | ICD-10-CM | POA: Insufficient documentation

## 2013-02-16 DIAGNOSIS — Z713 Dietary counseling and surveillance: Secondary | ICD-10-CM | POA: Insufficient documentation

## 2013-02-16 DIAGNOSIS — R7309 Other abnormal glucose: Secondary | ICD-10-CM

## 2013-02-16 DIAGNOSIS — E669 Obesity, unspecified: Secondary | ICD-10-CM | POA: Insufficient documentation

## 2013-02-16 NOTE — Telephone Encounter (Signed)
Pt informed

## 2013-02-16 NOTE — Progress Notes (Signed)
  Medical Nutrition Therapy:  Appt start time: 0845 end time:  0945.  Assessment:  Primary concerns today: patient here for diabetes and obesity. Lives with her daughters 60 and 41 years old and female friend who is now preparing the evening meals. He fries often. She works at [employer] in [employer] doing [employer], 8-5 Monday - Friday. Has exercise equipment at home that she uses frequently. SMBG occasionally, needs Rx for strips.  MEDICATIONS: see list   DIETARY INTAKE:  Usual eating pattern includes 3 meals and 0-1 snacks per day.  Everyday foods include variety of all food groups.  Avoided foods include none stated.    24-hr recall:  B ( AM): 1 link sausage, 2 toast with butter, and 2 eggs, water Snk ( AM): occasionally fresh fruit  L ( PM): buys or brings from home, left overs OR fast food sandwich burger or sandwich or chili, regular soda or water Snk ( PM): not usually D (7-9 PM): often fried now: meat, 3 sides and bread is prepared, chooses smaller servings as able. Water or low calorie fruit juice Snk ( PM): not usually Beverages: water,  low calorie fruit juice, regular soda, Russian Tea with Tang in it  Usual physical activity: she has exercise equipment including treadmill, kettle ball etc that she uses at home about twice a week  Estimated energy needs: 1400 calories 158 g carbohydrates 105 g protein 39 g fat  Progress Towards Goal(s):  In progress.   Nutritional Diagnosis:  NB-1.1 Food and nutrition-related knowledge deficit As related to diabetes management.  As evidenced by A1c of 8.3 % on 01/24/2013.    Intervention:  Nutrition counseling and diabetes education initiated. Discussed basic physiology of diabetes, SMBG and rationale of checking BG at alternate times of day, A1c, Carb Counting and reading food labels, and benefits of increased activity. Asked her to contact her MD for Rx for strips so she can test more often and use the data to help her  manage her DM better  Plan:  Aim for 3 Carb Choices per meal (45 grams) +/- 1 either way  Aim for 0-1 Carbs per snack if hungry  Consider reading food labels for Total Carbohydrate of foods Continue with your current activity level  Consider checking BG at alternate times per day once you get new Rx for the strips Continue taking medication Metformin with meals as directed by MD  Handouts given during visit include: Living Well with Diabetes Carb Counting and Food Label handouts Meal Plan Card  Monitoring/Evaluation:  Dietary intake, exercise, SMBG, and body weight in 4 week(s).

## 2013-02-16 NOTE — Patient Instructions (Signed)
Plan:  Aim for 3 Carb Choices per meal (45 grams) +/- 1 either way  Aim for 0-1 Carbs per snack if hungry  Consider reading food labels for Total Carbohydrate of foods Continue with your current activity level  Consider checking BG at alternate times per day once you get new Rx for the strips Continue taking medication Metformin with meals as directed by MD

## 2013-03-06 ENCOUNTER — Other Ambulatory Visit: Payer: Self-pay | Admitting: Obstetrics and Gynecology

## 2013-03-06 DIAGNOSIS — N644 Mastodynia: Secondary | ICD-10-CM

## 2013-03-13 ENCOUNTER — Encounter: Payer: Self-pay | Admitting: *Deleted

## 2013-03-13 ENCOUNTER — Encounter: Payer: 59 | Admitting: *Deleted

## 2013-03-13 VITALS — Ht 64.0 in | Wt 236.2 lb

## 2013-03-13 DIAGNOSIS — R7309 Other abnormal glucose: Secondary | ICD-10-CM

## 2013-03-13 DIAGNOSIS — E119 Type 2 diabetes mellitus without complications: Secondary | ICD-10-CM

## 2013-03-13 DIAGNOSIS — E669 Obesity, unspecified: Secondary | ICD-10-CM

## 2013-03-13 NOTE — Progress Notes (Signed)
  Medical Nutrition Therapy:  Appt start time: 1730 end time:  1800.  Assessment:  Primary concerns today: patient here for diabetes and obesity. States her boyfriend is frying less often now along with more lean meats. States she is aiming for 45 grams of carb at each meal. She has not gotten the Rx for strips yet so not testing. She is using the treadmill and bought a 4.6 pound hula hoop to exercise with about 3 days a week for about 30 minutes. Weight loss of 0.5 pounds in 4 weeks noted  MEDICATIONS: see list   DIETARY INTAKE:  Usual eating pattern includes 3 meals and 0-1 snacks per day.  Everyday foods include variety of all food groups.  Avoided foods include none stated.    24-hr recall:  B ( AM): 1 link sausage, 2 toast with butter, and 2 eggs, water Snk ( AM): occasionally fresh fruit  L ( PM): buys or brings from home, left overs OR fast food sandwich burger or sandwich or chili, regular soda or water Snk ( PM): not usually D (7-9 PM): often fried now: meat, 3 sides and bread is prepared, chooses smaller servings as able. Water or low calorie fruit juice Snk ( PM): not usually Beverages: water,  low calorie fruit juice, regular soda, Russian Tea with Tang in it  Usual physical activity: she has exercise equipment including treadmill, kettle ball etc that she uses at home about twice a week  Estimated energy needs: 1400 calories 158 g carbohydrates 105 g protein 39 g fat  Progress Towards Goal(s):  In progress.   Nutritional Diagnosis:  NB-1.1 Food and nutrition-related knowledge deficit As related to diabetes management.  As evidenced by A1c of 8.3 % on 01/24/2013.    Intervention: Commended her on her gradual changes to her eating habits and increase in her activity level. Again asked her to contact her MD for Rx for strips so she can test more often and use the data to help her manage her DM better. Encouraged her to continue with her current activity level and to  increase as tolerated.  Plan:  Continue to aim for 3 Carb Choices per meal (45 grams) +/- 1 either way  Continue to aim for 0-1 Carbs per snack if hungry  Continue reading food labels for Total Carbohydrate of foods Continue with your current activity level, consider increasing to 5 days a week for weight loss. Consider checking BG at alternate times per day once you get new Rx for the strips Continue taking medication Metformin with meals as directed by MD   Handouts given during visit include: none at this visit  Monitoring/Evaluation:  Dietary intake, exercise, SMBG, and body weight in 6 week(s).

## 2013-03-13 NOTE — Patient Instructions (Addendum)
Plan:  Continue to aim for 3 Carb Choices per meal (45 grams) +/- 1 either way  Continue to aim for 0-1 Carbs per snack if hungry  Continue reading food labels for Total Carbohydrate of foods Continue with your current activity level, consider increasing to 5 days a week for weight loss. Consider checking BG at alternate times per day once you get new Rx for the strips Continue taking medication Metformin with meals as directed by MD

## 2013-03-14 ENCOUNTER — Ambulatory Visit
Admission: RE | Admit: 2013-03-14 | Discharge: 2013-03-14 | Disposition: A | Discharge status: Home / Self Care | Payer: 59 | Source: Ambulatory Visit | Attending: Obstetrics and Gynecology | Admitting: Obstetrics and Gynecology

## 2013-03-14 DIAGNOSIS — N644 Mastodynia: Secondary | ICD-10-CM

## 2013-03-30 ENCOUNTER — Other Ambulatory Visit: Payer: Self-pay | Admitting: *Deleted

## 2013-03-30 ENCOUNTER — Telehealth: Payer: Self-pay | Admitting: *Deleted

## 2013-03-30 MED ORDER — FREESTYLE LANCETS MISC: Status: DC

## 2013-03-30 MED ORDER — GLUCOSE BLOOD VI STRP: ORAL_STRIP | Status: DC

## 2013-03-30 NOTE — Telephone Encounter (Signed)
Patient left vm requesting test strips and lancets be called in for her Freestyle glucometer. Rfs sent to pharmacy. Pt informed

## 2013-03-31 ENCOUNTER — Other Ambulatory Visit: Payer: Self-pay

## 2013-03-31 ENCOUNTER — Encounter (HOSPITAL_COMMUNITY): Payer: Self-pay | Admitting: Emergency Medicine

## 2013-03-31 ENCOUNTER — Emergency Department (HOSPITAL_COMMUNITY)
Admission: EM | Admit: 2013-03-31 | Discharge: 2013-03-31 | Disposition: A | Discharge status: Home / Self Care | Payer: 59 | Source: Home / Self Care

## 2013-03-31 DIAGNOSIS — R0789 Other chest pain: Secondary | ICD-10-CM

## 2013-03-31 DIAGNOSIS — R071 Chest pain on breathing: Secondary | ICD-10-CM

## 2013-03-31 LAB — POCT I-STAT, CHEM 8
BUN: 13 mg/dL (ref 6–23)
Chloride: 102 mEq/L (ref 96–112)
Sodium: 140 mEq/L (ref 135–145)

## 2013-03-31 MED ORDER — MELOXICAM 15 MG PO TABS: 15.0000 mg | ORAL_TABLET | Freq: Every day | ORAL | Status: DC

## 2013-03-31 MED ORDER — KETOROLAC TROMETHAMINE 60 MG/2ML IM SOLN
INTRAMUSCULAR | Status: AC
  Filled 2013-03-31: qty 2

## 2013-03-31 MED ORDER — KETOROLAC TROMETHAMINE 60 MG/2ML IM SOLN
60.0000 mg | Freq: Once | INTRAMUSCULAR | Status: AC
  Administered 2013-03-31: 60 mg via INTRAMUSCULAR

## 2013-03-31 NOTE — ED Provider Notes (Signed)
History     CSN: [id number]  Arrival date & time 03/31/13  1301   None     Chief Complaint  Patient presents with  . Chest Pain    (Consider location/radiation/quality/duration/timing/severity/associated sxs/prior treatment) HPI Comments: 42 year old female had an argument with her daughter this morning after which she got into her car and drove to her destination. Was getting out of the car she experienced an acute sharp pain in the left anterior and lateral chest wall. The pain is worse with taking a deep breath and movement of the left arm. She initially had shortness of breath this lasted for few minutes. She has no shortness of breath now just the pleuritic type chest pain. Denies diaphoresis, syncope or palpitations. She has no cardiovascular or pulmonary history.   Past Medical History  Diagnosis Date  . Chronic headache     Dr Vela Prose, Neg MRI 2005  . Anemia, iron deficiency   . HTN (hypertension)   . GERD (gastroesophageal reflux disease)   . Elevated glucose 2011  . Diabetes mellitus     Past Surgical History  Procedure Laterality Date  . Partial hysterectomy  2010  . Nose surgery    . Breast reduction surgery    . Cyst removed from vagina    . Induced abortion  1990-1994    x 4  . Tubal ligation    . Abdominal hysterectomy      Family History  Problem Relation Age of Onset  . Hypertension Mother   . Cancer Mother 80    Esophageal  . Hypertension Maternal Grandmother     History  Substance Use Topics  . Smoking status: Never Smoker   . Smokeless tobacco: Never Used  . Alcohol Use: Yes     Comment: 2 wine or mixed drinks on a weekend    OB History   Grav Para Term Preterm Abortions TAB SAB Ect Mult Living                  Review of Systems  Constitutional: Negative.   HENT: Negative.   Respiratory: Positive for shortness of breath. Negative for cough and wheezing.   Cardiovascular: Positive for chest pain. Negative for leg swelling.   Gastrointestinal: Negative.   Genitourinary: Negative.   Skin: Negative.   Neurological: Negative.   Psychiatric/Behavioral: The patient is nervous/anxious.     Allergies  Ciprofloxacin  Home Medications   Current Outpatient Rx  Name  Route  Sig  Dispense  Refill  . metFORMIN (GLUCOPHAGE) 500 MG tablet   Oral   Take 2 tablets (1,000 mg total) by mouth 2 (two) times daily with a meal.   120 tablet   11   . butalbital-acetaminophen-caffeine (FIORICET) 50-325-40 MG per tablet   Oral   Take 1-2 tablets by mouth 2 (two) times daily as needed for headache.   30 tablet   0   . cetirizine-pseudoephedrine (ZYRTEC-D) 5-120 MG per tablet   Oral   Take 1 tablet by mouth 2 (two) times daily.   30 tablet   0   . Cholecalciferol 1000 UNITS tablet   Oral   Take 1,000 Units by mouth daily.           Marland Kitchen dexlansoprazole (DEXILANT) 60 MG capsule   Oral   Take 1 capsule (60 mg total) by mouth every morning. For indigestion   30 capsule   11   . fexofenadine (ALLEGRA) 180 MG tablet   Oral   Take 1  tablet (180 mg total) by mouth daily.   30 tablet   2   . gabapentin (NEURONTIN) 100 MG capsule   Oral   Take 1 capsule (100 mg total) by mouth 3 (three) times daily.   90 capsule   3   . glucose blood (FREESTYLE TEST STRIPS) test strip      Use two times daily as instructed. Dx: 250.00   100 each   6   . ketoconazole (NIZORAL) 2 % shampoo   Topical   Apply 1 application topically 2 (two) times a week.           . Lancets (FREESTYLE) lancets      Use two times daily as instructed. Dx: 250.00   100 each   6   . meloxicam (MOBIC) 15 MG tablet   Oral   Take 1 tablet (15 mg total) by mouth daily.   14 tablet   0   . Olmesartan-Amlodipine-HCTZ (TRIBENZOR) 40-5-25 MG TABS   Per post-pyloric tube   1 tablet by Per post-pyloric tube route daily.   30 tablet   11   . sodium chloride (OCEAN NASAL SPRAY) 0.65 % nasal spray   Nasal   Place 1 spray into the nose as  needed for congestion.   30 mL   12     BP 154/99  Pulse 85  Temp(Src) 97.4 F (36.3 C) (Oral)  Resp 18  SpO2 100%  Physical Exam  Nursing note and vitals reviewed. Constitutional: She is oriented to person, place, and time. She appears well-developed and well-nourished. No distress.  HENT:  Head: Normocephalic and atraumatic.  Eyes: EOM are normal. Pupils are equal, round, and reactive to light.  Neck: Normal range of motion. Neck supple.  Cardiovascular: Normal rate, regular rhythm and normal heart sounds.  Exam reveals no gallop.   No murmur heard. Pulmonary/Chest: Effort normal and breath sounds normal. No respiratory distress. She has no wheezes. She has no rales.  Marked, reproducible L anterior and lateral chest wall tenderness. Producing the same pain for which she initially complained and prompted her to come to urgent care.  Abdominal: Soft. She exhibits no distension. There is no tenderness.  Musculoskeletal: She exhibits no edema.  Lymphadenopathy:    She has no cervical adenopathy.  Neurological: She is alert and oriented to person, place, and time. No cranial nerve deficit.  Skin: Skin is warm and dry.  Psychiatric: She has a normal mood and affect.    ED Course  Procedures (including critical care time)  Labs Reviewed  POCT I-STAT, CHEM 8 - Abnormal; Notable for the following:    Glucose, Bld 104 (*)    All other components within normal limits   No results found. Results for orders placed during the hospital encounter of 03/31/13  POCT I-STAT, CHEM 8      Result Value Range   Sodium 140  135 - 145 mEq/L   Potassium 3.7  3.5 - 5.1 mEq/L   Chloride 102  96 - 112 mEq/L   BUN 13  6 - 23 mg/dL   Creatinine, Ser 1.61  0.50 - 1.10 mg/dL   Glucose, Bld 096 (*) 70 - 99 mg/dL   Calcium, Ion 0.45  4.09 - 1.23 mmol/L   TCO2 26  0 - 100 mmol/L   Hemoglobin 13.9  12.0 - 15.0 g/dL   HCT 81.1  91.4 - 78.2 %     1. Acute chest wall pain  MDM  EKG:  Normal sinus rhythm without ST-T changes. No ischemic changes. No ectopy.  Chest wall tenderness his diagnostic. Doubt cardiovascular etiology. She states that she feels "all right". She did notice much change after the Toradol 60 mg IM. She is advised to apply ice to areas of soreness and Mobic 15 mg one daily when necessary pain. Followup with her primary care doctor in 4-5 days as needed. For any new symptoms problems or worsening may return or go to emergency department.  Hayden Rasmussen, NP 03/31/13 1445

## 2013-03-31 NOTE — ED Notes (Signed)
Report given to Hayden Rasmussen, NP

## 2013-03-31 NOTE — ED Notes (Signed)
Pt c/o chest pain onset 45 minutes ago Reports she was arguing w/teenage daughter prior to the pains Pain increases w/activity and breathing Sx also include: SOB Denies: f/v/n/d, edema, headaches, blurry vision Hx of HBP  She is alert and oriented w/no signs of acute distress.

## 2013-03-31 NOTE — ED Provider Notes (Signed)
Medical screening examination/treatment/procedure(s) were performed by non-physician practitioner and as supervising physician I was immediately available for consultation/collaboration.  Leslee Home, M.D.  Reuben Likes, MD 03/31/13 [phone]

## 2013-04-24 ENCOUNTER — Ambulatory Visit: Payer: 59 | Admitting: *Deleted

## 2013-04-24 ENCOUNTER — Encounter (HOSPITAL_COMMUNITY): Payer: Self-pay | Admitting: Emergency Medicine

## 2013-04-24 ENCOUNTER — Emergency Department (HOSPITAL_COMMUNITY)
Admission: EM | Admit: 2013-04-24 | Discharge: 2013-04-24 | Disposition: A | Discharge status: Home / Self Care | Payer: 59 | Source: Home / Self Care | Attending: Family Medicine | Admitting: Family Medicine

## 2013-04-24 DIAGNOSIS — J302 Other seasonal allergic rhinitis: Secondary | ICD-10-CM

## 2013-04-24 DIAGNOSIS — J309 Allergic rhinitis, unspecified: Secondary | ICD-10-CM

## 2013-04-24 MED ORDER — BUDESONIDE 32 MCG/ACT NA SUSP: 1.0000 | Freq: Two times a day (BID) | NASAL | Status: DC

## 2013-04-24 MED ORDER — TRIAMCINOLONE ACETONIDE 40 MG/ML IJ SUSP
40.0000 mg | Freq: Once | INTRAMUSCULAR | Status: AC
  Administered 2013-04-24: 40 mg via INTRAMUSCULAR

## 2013-04-24 MED ORDER — TRIAMCINOLONE ACETONIDE 40 MG/ML IJ SUSP
INTRAMUSCULAR | Status: AC
  Filled 2013-04-24: qty 5

## 2013-04-24 MED ORDER — METHYLPREDNISOLONE ACETATE 40 MG/ML IJ SUSP
80.0000 mg | Freq: Once | INTRAMUSCULAR | Status: AC
  Administered 2013-04-24: 80 mg via INTRAMUSCULAR

## 2013-04-24 MED ORDER — METHYLPREDNISOLONE ACETATE 80 MG/ML IJ SUSP
INTRAMUSCULAR | Status: AC
  Filled 2013-04-24: qty 1

## 2013-04-24 MED ORDER — FEXOFENADINE HCL 180 MG PO TABS: 180.0000 mg | ORAL_TABLET | Freq: Every day | ORAL | Status: DC

## 2013-04-24 NOTE — ED Provider Notes (Signed)
History     CSN: [id number]  Arrival date & time 04/24/13  1734   First MD Initiated Contact with Patient 04/24/13 1842      Chief Complaint  Patient presents with  . Nasal Congestion    (Consider location/radiation/quality/duration/timing/severity/associated sxs/prior treatment) Patient is a 42 y.o. female presenting with URI. The history is provided by the patient.  URI Presenting symptoms: congestion, cough and rhinorrhea   Presenting symptoms: no fever   Severity:  Mild Onset quality:  Gradual Duration:  2 weeks Progression:  Unchanged Chronicity:  New Associated symptoms: sneezing     Past Medical History  Diagnosis Date  . Chronic headache     Dr Vela Prose, Neg MRI 2005  . Anemia, iron deficiency   . HTN (hypertension)   . GERD (gastroesophageal reflux disease)   . Elevated glucose 2011  . Diabetes mellitus     Past Surgical History  Procedure Laterality Date  . Partial hysterectomy  2010  . Nose surgery    . Breast reduction surgery    . Cyst removed from vagina    . Induced abortion  1990-1994    x 4  . Tubal ligation    . Abdominal hysterectomy      Family History  Problem Relation Age of Onset  . Hypertension Mother   . Cancer Mother 81    Esophageal  . Hypertension Maternal Grandmother     History  Substance Use Topics  . Smoking status: Never Smoker   . Smokeless tobacco: Never Used  . Alcohol Use: Yes     Comment: 2 wine or mixed drinks on a weekend    OB History   Grav Para Term Preterm Abortions TAB SAB Ect Mult Living                  Review of Systems  Constitutional: Negative.  Negative for fever.  HENT: Positive for congestion, rhinorrhea, sneezing and postnasal drip.   Respiratory: Positive for cough.     Allergies  Ciprofloxacin  Home Medications   Current Outpatient Rx  Name  Route  Sig  Dispense  Refill  . dexlansoprazole (DEXILANT) 60 MG capsule   Oral   Take 1 capsule (60 mg total) by mouth every morning. For  indigestion   30 capsule   11   . metFORMIN (GLUCOPHAGE) 500 MG tablet   Oral   Take 2 tablets (1,000 mg total) by mouth 2 (two) times daily with a meal.   120 tablet   11   . Olmesartan-Amlodipine-HCTZ (TRIBENZOR) 40-5-25 MG TABS   Per post-pyloric tube   1 tablet by Per post-pyloric tube route daily.   30 tablet   11   . budesonide (RHINOCORT AQUA) 32 MCG/ACT nasal spray   Nasal   Place 1 spray into the nose 2 (two) times daily. One spray each nostril bid   1 Bottle   1   . butalbital-acetaminophen-caffeine (FIORICET) 50-325-40 MG per tablet   Oral   Take 1-2 tablets by mouth 2 (two) times daily as needed for headache.   30 tablet   0   . cetirizine-pseudoephedrine (ZYRTEC-D) 5-120 MG per tablet   Oral   Take 1 tablet by mouth 2 (two) times daily.   30 tablet   0   . Cholecalciferol 1000 UNITS tablet   Oral   Take 1,000 Units by mouth daily.           Marland Kitchen EXPIRED: fexofenadine (ALLEGRA) 180 MG tablet  Oral   Take 1 tablet (180 mg total) by mouth daily.   30 tablet   2   . fexofenadine (ALLEGRA) 180 MG tablet   Oral   Take 1 tablet (180 mg total) by mouth daily.   30 tablet   1   . gabapentin (NEURONTIN) 100 MG capsule   Oral   Take 1 capsule (100 mg total) by mouth 3 (three) times daily.   90 capsule   3   . glucose blood (FREESTYLE TEST STRIPS) test strip      Use two times daily as instructed. Dx: 250.00   100 each   6   . ketoconazole (NIZORAL) 2 % shampoo   Topical   Apply 1 application topically 2 (two) times a week.           . Lancets (FREESTYLE) lancets      Use two times daily as instructed. Dx: 250.00   100 each   6   . meloxicam (MOBIC) 15 MG tablet   Oral   Take 1 tablet (15 mg total) by mouth daily.   14 tablet   0   . sodium chloride (OCEAN NASAL SPRAY) 0.65 % nasal spray   Nasal   Place 1 spray into the nose as needed for congestion.   30 mL   12     BP 148/100  Pulse 95  Temp(Src) 98.4 F (36.9 C) (Oral)   Resp 20  SpO2 100%  Physical Exam  Nursing note and vitals reviewed. Constitutional: She is oriented to person, place, and time. She appears well-developed and well-nourished.  HENT:  Head: Normocephalic.  Right Ear: External ear normal.  Left Ear: External ear normal.  Mouth/Throat: Oropharynx is clear and moist.  Eyes: Pupils are equal, round, and reactive to light.  Neck: Normal range of motion. Neck supple.  Cardiovascular: Regular rhythm and normal heart sounds.   Pulmonary/Chest: Breath sounds normal.  Neurological: She is alert and oriented to person, place, and time.  Skin: Skin is warm and dry.    ED Course  Procedures (including critical care time)  Labs Reviewed - No data to display No results found.   1. Seasonal allergic rhinitis       MDM          Linna Hoff, MD 04/24/13 [phone]

## 2013-04-24 NOTE — ED Notes (Signed)
C/o chest congestion, nasal congestion, eyes watery.  Patient taking claritin, but does not see any improvement

## 2013-04-24 NOTE — ED Notes (Signed)
Given sprite/ice

## 2013-05-02 ENCOUNTER — Encounter: Payer: Self-pay | Admitting: Internal Medicine

## 2013-05-02 ENCOUNTER — Ambulatory Visit (INDEPENDENT_AMBULATORY_CARE_PROVIDER_SITE_OTHER): Payer: 59 | Admitting: Internal Medicine

## 2013-05-02 ENCOUNTER — Other Ambulatory Visit (INDEPENDENT_AMBULATORY_CARE_PROVIDER_SITE_OTHER): Payer: 59

## 2013-05-02 VITALS — BP 120/88 | HR 80 | Temp 98.2°F | Resp 16 | Wt 237.0 lb

## 2013-05-02 DIAGNOSIS — I10 Essential (primary) hypertension: Secondary | ICD-10-CM

## 2013-05-02 DIAGNOSIS — D509 Iron deficiency anemia, unspecified: Secondary | ICD-10-CM

## 2013-05-02 DIAGNOSIS — E119 Type 2 diabetes mellitus without complications: Secondary | ICD-10-CM

## 2013-05-02 DIAGNOSIS — D72829 Elevated white blood cell count, unspecified: Secondary | ICD-10-CM | POA: Insufficient documentation

## 2013-05-02 DIAGNOSIS — E669 Obesity, unspecified: Secondary | ICD-10-CM

## 2013-05-02 LAB — BASIC METABOLIC PANEL
BUN: 17 mg/dL (ref 6–23)
Calcium: 8.9 mg/dL (ref 8.4–10.5)
GFR: 98.14 mL/min (ref >60.00)
Glucose, Bld: 122 mg/dL — ABNORMAL HIGH (ref 70–99)
Potassium: 4 mEq/L (ref 3.5–5.1)

## 2013-05-02 LAB — CBC WITH DIFFERENTIAL/PLATELET
Basophils Relative: 1 % (ref 0.0–3.0)
Eosinophils Relative: 0.8 % (ref 0.0–5.0)
HCT: 42.7 % (ref 36.0–46.0)
Lymphs Abs: 3.1 10*3/uL (ref 0.7–4.0)
MCV: 86.2 fl (ref 78.0–100.0)
Monocytes Absolute: 1.1 10*3/uL — ABNORMAL HIGH (ref 0.1–1.0)
RBC: 4.96 Mil/uL (ref 3.87–5.11)
WBC: 14.5 10*3/uL — ABNORMAL HIGH (ref 4.5–10.5)

## 2013-05-02 MED ORDER — ALOGLIPTIN-PIOGLITAZONE 25-15 MG PO TABS: 1.0000 | ORAL_TABLET | Freq: Every day | ORAL | Status: DC

## 2013-05-02 NOTE — Patient Instructions (Signed)
[email or web address]

## 2013-05-02 NOTE — Assessment & Plan Note (Signed)
5/14 due to recent cortisone shot

## 2013-05-02 NOTE — Assessment & Plan Note (Signed)
Chronic  Not interested in LapBand Advised LapBand

## 2013-05-02 NOTE — Progress Notes (Signed)
   Subjective:    HPI  The patient needs to address  chronic hypertension that has been well controlled with medicines; to address chronic  hyperlipidemia controlled with medicines as well; and to address type 2 chronic diabetes, controlled with medical treatment and diet.   Review of Systems  Constitutional: Negative for chills, activity change, appetite change, fatigue and unexpected weight change.  HENT: Negative for congestion, mouth sores, trouble swallowing, voice change and sinus pressure.   Eyes: Negative for visual disturbance.  Respiratory: Positive for chest tightness. Negative for cough, shortness of breath, wheezing and stridor.   Cardiovascular: Negative for palpitations and leg swelling.  Gastrointestinal: Negative for nausea, vomiting, abdominal pain and rectal pain.       GERD - mild   Genitourinary: Negative for urgency, frequency, hematuria, flank pain, decreased urine volume, difficulty urinating and vaginal pain.  Musculoskeletal: Negative for myalgias, back pain and gait problem.  Skin: Negative for pallor, rash and wound.  Neurological: Negative for dizziness, tremors, weakness, numbness and headaches.  Hematological: Negative for adenopathy.  Psychiatric/Behavioral: Negative for confusion, sleep disturbance, decreased concentration and agitation. The patient is not nervous/anxious.    Wt Readings from Last 3 Encounters:  05/02/13 237 lb (107.502 kg)  03/13/13 236 lb 3.2 oz (107.14 kg)  02/16/13 236 lb 14.4 oz (107.457 kg)   BP Readings from Last 3 Encounters:  05/02/13 120/88  04/24/13 148/100  03/31/13 154/99         Objective:   Physical Exam  Constitutional: She appears well-developed. No distress.  Obese   HENT:  Head: Normocephalic.  Right Ear: External ear normal.  Left Ear: External ear normal.  Nose: Nose normal.  Mouth/Throat: Oropharynx is clear and moist.  Eyes: Conjunctivae are normal. Pupils are equal, round, and reactive to light.  Right eye exhibits no discharge. Left eye exhibits no discharge.  Neck: Normal range of motion. Neck supple. No JVD present. No tracheal deviation present. No thyromegaly present.  Cardiovascular: Normal rate, regular rhythm and normal heart sounds.   Pulmonary/Chest: No stridor. No respiratory distress. She has no wheezes.  Abdominal: Soft. Bowel sounds are normal. She exhibits no distension and no mass. There is no tenderness. There is no rebound and no guarding.  Musculoskeletal: She exhibits no edema and no tenderness.  Lymphadenopathy:    She has no cervical adenopathy.  Neurological: She displays normal reflexes. No cranial nerve deficit. She exhibits normal muscle tone. Coordination normal.  Skin: No rash noted. No erythema.  Psychiatric: She has a normal mood and affect. Her behavior is normal. Judgment and thought content normal.    Lab Results  Component Value Date   WBC 14.5* 05/02/2013   HGB 14.6 05/02/2013   HCT 42.7 05/02/2013   PLT 359.0 05/02/2013   GLUCOSE 122* 05/02/2013   CHOL 189 07/21/2012   TRIG 67.0 07/21/2012   HDL 54.00 07/21/2012   LDLDIRECT 137.4 11/04/2010   LDLCALC 122* 07/21/2012   ALT 19 07/21/2012   AST 16 07/21/2012   NA 137 05/02/2013   K 4.0 05/02/2013   CL 100 05/02/2013   CREATININE 0.8 05/02/2013   BUN 17 05/02/2013   CO2 32 05/02/2013   TSH 1.86 07/21/2012   HGBA1C 7.7* 05/02/2013         Assessment & Plan:

## 2013-05-02 NOTE — Assessment & Plan Note (Signed)
Continue with current prescription therapy as reflected on the Med list.  

## 2013-05-02 NOTE — Assessment & Plan Note (Addendum)
5/14 better - not at goal Add Oseni

## 2013-06-06 ENCOUNTER — Encounter: Payer: 59 | Attending: Internal Medicine | Admitting: *Deleted

## 2013-06-06 VITALS — Ht 64.0 in | Wt 238.9 lb

## 2013-06-06 DIAGNOSIS — E119 Type 2 diabetes mellitus without complications: Secondary | ICD-10-CM | POA: Insufficient documentation

## 2013-06-06 DIAGNOSIS — E669 Obesity, unspecified: Secondary | ICD-10-CM | POA: Insufficient documentation

## 2013-06-06 DIAGNOSIS — R7309 Other abnormal glucose: Secondary | ICD-10-CM

## 2013-06-06 DIAGNOSIS — Z713 Dietary counseling and surveillance: Secondary | ICD-10-CM | POA: Insufficient documentation

## 2013-06-06 NOTE — Progress Notes (Signed)
  Medical Nutrition Therapy:  Appt start time: 1730 end time:  1800.  Assessment:  Primary concerns today: patient here for diabetes and obesity follow up visit. SMBG now when she feels funny, about once a week.  States average range is 120-150 including pre and post meals. Improvement of A1c from 8.3 to 7.7% noted in past 3 months! She is continuing using the treadmill and hula hoop to exercise with about 3 days a week for about 30 minutes.   MEDICATIONS: see list   DIETARY INTAKE:  Usual eating pattern includes 3 meals and 0-1 snacks per day.  Everyday foods include variety of all food groups.  Avoided foods include none stated.    24-hr recall:  B ( AM): 1 link sausage, 2 toast with butter, and 2 eggs, water Snk ( AM): occasionally fresh fruit  L ( PM): buys or brings from home, left overs OR fast food sandwich burger or sandwich or chili, regular soda or water Snk ( PM): not usually D (7-9 PM): often fried now: meat, 3 sides and bread is prepared, chooses smaller servings as able. Water or low calorie fruit juice Snk ( PM): not usually Beverages: water,  low calorie fruit juice, regular soda, Russian Tea with Tang in it  Usual physical activity: she has exercise equipment including treadmill, kettle ball etc that she uses at home about twice a week  Estimated energy needs: 1400 calories 158 g carbohydrates 105 g protein 39 g fat  Progress Towards Goal(s):  In progress.   Nutritional Diagnosis:  NB-1.1 Food and nutrition-related knowledge deficit As related to diabetes management.  As evidenced by A1c of 8.3 % on 01/24/2013. Now down to 7.7% on 05/02/2013    Intervention: Commended her on her gradual changes to her eating habits and increase in her activity level. Encouraged her to continue with her current activity level and to increase as tolerated. Also suggested she test BG after exercise to see benefit.  Plan:  Continue to aim for 3 Carb Choices per meal (45 grams) +/- 1  either way  Continue to aim for 0-1 Carbs per snack if hungry  Continue reading food labels for Total Carbohydrate and Fat Grams of foods Continue with your current activity level, consider increasing to 5 days a week for weight loss. Consider checking BG daily at alternate times of day  Continue taking medication Metformin with meals as directed by MD    Handouts given during visit include: none at this visit  Monitoring/Evaluation:  Dietary intake, exercise, SMBG, and body weight PRN per patient choice.

## 2013-06-06 NOTE — Patient Instructions (Addendum)
Plan:  Continue to aim for 3 Carb Choices per meal (45 grams) +/- 1 either way  Continue to aim for 0-1 Carbs per snack if hungry  Continue reading food labels for Total Carbohydrate and Fat Grams of foods Continue with your current activity level, consider increasing to 5 days a week for weight loss. Consider checking BG daily at alternate times of day  Continue taking medication Metformin with meals as directed by MD

## 2013-07-25 ENCOUNTER — Ambulatory Visit (INDEPENDENT_AMBULATORY_CARE_PROVIDER_SITE_OTHER): Payer: 59

## 2013-07-25 ENCOUNTER — Ambulatory Visit (INDEPENDENT_AMBULATORY_CARE_PROVIDER_SITE_OTHER): Payer: 59 | Admitting: Internal Medicine

## 2013-07-25 ENCOUNTER — Encounter: Payer: Self-pay | Admitting: Internal Medicine

## 2013-07-25 VITALS — BP 120/78 | HR 64 | Temp 98.3°F | Resp 16 | Wt 235.0 lb

## 2013-07-25 DIAGNOSIS — I10 Essential (primary) hypertension: Secondary | ICD-10-CM

## 2013-07-25 DIAGNOSIS — E669 Obesity, unspecified: Secondary | ICD-10-CM

## 2013-07-25 DIAGNOSIS — R202 Paresthesia of skin: Secondary | ICD-10-CM | POA: Insufficient documentation

## 2013-07-25 DIAGNOSIS — R209 Unspecified disturbances of skin sensation: Secondary | ICD-10-CM

## 2013-07-25 DIAGNOSIS — E119 Type 2 diabetes mellitus without complications: Secondary | ICD-10-CM

## 2013-07-25 DIAGNOSIS — M545 Low back pain, unspecified: Secondary | ICD-10-CM

## 2013-07-25 LAB — BASIC METABOLIC PANEL
Chloride: 97 mEq/L (ref 96–112)
Creatinine, Ser: 1 mg/dL (ref 0.4–1.2)
GFR: 82.73 mL/min (ref >60.00)

## 2013-07-25 LAB — HEPATIC FUNCTION PANEL
ALT: 23 U/L (ref 0–35)
Albumin: 3.9 g/dL (ref 3.5–5.2)
Alkaline Phosphatase: 61 U/L (ref 39–117)
Total Protein: 7.8 g/dL (ref 6.0–8.3)

## 2013-07-25 LAB — VITAMIN B12: Vitamin B-12: 792 pg/mL (ref 211–911)

## 2013-07-25 LAB — HEMOGLOBIN A1C: Hgb A1c MFr Bld: 7.5 % — ABNORMAL HIGH (ref 4.6–6.5)

## 2013-07-25 MED ORDER — MELOXICAM 15 MG PO TABS: 15.0000 mg | ORAL_TABLET | Freq: Every day | ORAL | Status: DC

## 2013-07-25 NOTE — Patient Instructions (Addendum)
Call if not better Tylenol pm for cramps

## 2013-07-25 NOTE — Assessment & Plan Note (Signed)
Continue with current prescription therapy as reflected on the Med list.  

## 2013-07-25 NOTE — Assessment & Plan Note (Signed)
8/14 L side ? etiol

## 2013-07-25 NOTE — Assessment & Plan Note (Signed)
Better  

## 2013-07-25 NOTE — Assessment & Plan Note (Signed)
Wt Readings from Last 3 Encounters:  07/25/13 235 lb (106.595 kg)  06/06/13 238 lb 14.4 oz (108.364 kg)  05/02/13 237 lb (107.502 kg)

## 2013-07-25 NOTE — Progress Notes (Signed)
Patient ID: [name] [name], female   DOB: [birth year], 42 y.o.   MRN: [id number]   Subjective:    HPI  C/o L hand and L foot numbness x 1 wk C/o B LE cramps L>R x 1 wk C/o fatigue x 1 wk  The patient needs to address  chronic hypertension that has been well controlled with medicines; to address chronic  hyperlipidemia controlled with medicines as well; and to address type 2 chronic diabetes, controlled with medical treatment and diet.   Review of Systems  Constitutional: Negative for chills, activity change, appetite change, fatigue and unexpected weight change.  HENT: Negative for congestion, mouth sores, trouble swallowing, voice change and sinus pressure.   Eyes: Negative for visual disturbance.  Respiratory: Positive for chest tightness. Negative for cough, shortness of breath, wheezing and stridor.   Cardiovascular: Negative for palpitations and leg swelling.  Gastrointestinal: Negative for nausea, vomiting, abdominal pain and rectal pain.       GERD - mild   Genitourinary: Negative for urgency, frequency, hematuria, flank pain, decreased urine volume, difficulty urinating and vaginal pain.  Musculoskeletal: Negative for myalgias, back pain and gait problem.  Skin: Negative for pallor, rash and wound.  Neurological: Negative for dizziness, tremors, weakness, numbness and headaches.  Hematological: Negative for adenopathy.  Psychiatric/Behavioral: Negative for confusion, sleep disturbance, decreased concentration and agitation. The patient is not nervous/anxious.    Wt Readings from Last 3 Encounters:  07/25/13 235 lb (106.595 kg)  06/06/13 238 lb 14.4 oz (108.364 kg)  05/02/13 237 lb (107.502 kg)   BP Readings from Last 3 Encounters:  07/25/13 120/78  05/02/13 120/88  04/24/13 148/100         Objective:   Physical Exam  Constitutional: She appears well-developed. No distress.  Obese   HENT:  Head: Normocephalic.  Right Ear: External ear normal.  Left Ear:  External ear normal.  Nose: Nose normal.  Mouth/Throat: Oropharynx is clear and moist.  Eyes: Conjunctivae are normal. Pupils are equal, round, and reactive to light. Right eye exhibits no discharge. Left eye exhibits no discharge.  Neck: Normal range of motion. Neck supple. No JVD present. No tracheal deviation present. No thyromegaly present.  Cardiovascular: Normal rate, regular rhythm and normal heart sounds.   Pulmonary/Chest: No stridor. No respiratory distress. She has no wheezes.  Abdominal: Soft. Bowel sounds are normal. She exhibits no distension and no mass. There is no tenderness. There is no rebound and no guarding.  Musculoskeletal: She exhibits no edema and no tenderness.  Lymphadenopathy:    She has no cervical adenopathy.  Neurological: She displays normal reflexes. No cranial nerve deficit. She exhibits normal muscle tone. Coordination normal.  Skin: No rash noted. No erythema.  Psychiatric: She has a normal mood and affect. Her behavior is normal. Judgment and thought content normal.    Lab Results  Component Value Date   WBC 14.5* 05/02/2013   HGB 14.6 05/02/2013   HCT 42.7 05/02/2013   PLT 359.0 05/02/2013   GLUCOSE 122* 05/02/2013   CHOL 189 07/21/2012   TRIG 67.0 07/21/2012   HDL 54.00 07/21/2012   LDLDIRECT 137.4 11/04/2010   LDLCALC 122* 07/21/2012   ALT 19 07/21/2012   AST 16 07/21/2012   NA 137 05/02/2013   K 4.0 05/02/2013   CL 100 05/02/2013   CREATININE 0.8 05/02/2013   BUN 17 05/02/2013   CO2 32 05/02/2013   TSH 1.86 07/21/2012   HGBA1C 7.7* 05/02/2013  Assessment & Plan:

## 2013-07-26 MED ORDER — ALOGLIPTIN-PIOGLITAZONE 25-30 MG PO TABS: 1.0000 | ORAL_TABLET | Freq: Every day | ORAL | Status: DC

## 2013-08-04 ENCOUNTER — Ambulatory Visit: Payer: 59 | Admitting: Internal Medicine

## 2013-08-25 ENCOUNTER — Ambulatory Visit: Payer: 59 | Admitting: Internal Medicine

## 2013-08-25 DIAGNOSIS — Z0289 Encounter for other administrative examinations: Secondary | ICD-10-CM

## 2013-09-15 ENCOUNTER — Telehealth: Payer: Self-pay | Admitting: Internal Medicine

## 2013-09-15 NOTE — Telephone Encounter (Signed)
Rec'd from [facility] forward 4 pages to Dr.Plotnikov °

## 2013-10-19 ENCOUNTER — Other Ambulatory Visit: Payer: Self-pay

## 2014-02-12 ENCOUNTER — Encounter: Payer: Self-pay | Admitting: Internal Medicine

## 2014-02-12 ENCOUNTER — Ambulatory Visit (INDEPENDENT_AMBULATORY_CARE_PROVIDER_SITE_OTHER): Payer: 59 | Admitting: Internal Medicine

## 2014-02-12 ENCOUNTER — Other Ambulatory Visit (INDEPENDENT_AMBULATORY_CARE_PROVIDER_SITE_OTHER): Payer: 59

## 2014-02-12 VITALS — BP 110/72 | HR 77 | Temp 97.2°F | Wt 237.0 lb

## 2014-02-12 DIAGNOSIS — E669 Obesity, unspecified: Secondary | ICD-10-CM

## 2014-02-12 DIAGNOSIS — L218 Other seborrheic dermatitis: Secondary | ICD-10-CM

## 2014-02-12 DIAGNOSIS — G4700 Insomnia, unspecified: Secondary | ICD-10-CM

## 2014-02-12 DIAGNOSIS — I10 Essential (primary) hypertension: Secondary | ICD-10-CM

## 2014-02-12 DIAGNOSIS — E119 Type 2 diabetes mellitus without complications: Secondary | ICD-10-CM

## 2014-02-12 DIAGNOSIS — R590 Localized enlarged lymph nodes: Secondary | ICD-10-CM

## 2014-02-12 DIAGNOSIS — R599 Enlarged lymph nodes, unspecified: Secondary | ICD-10-CM

## 2014-02-12 LAB — BASIC METABOLIC PANEL
BUN: 16 mg/dL (ref 6–23)
CHLORIDE: 103 meq/L (ref 96–112)
CO2: 30 meq/L (ref 19–32)
Calcium: 8.9 mg/dL (ref 8.4–10.5)
Creatinine, Ser: 0.8 mg/dL (ref 0.4–1.2)
GFR: 100.61 mL/min (ref >60.00)
GLUCOSE: 92 mg/dL (ref 70–99)
Potassium: 3.7 mEq/L (ref 3.5–5.1)
SODIUM: 138 meq/L (ref 135–145)

## 2014-02-12 LAB — HEMOGLOBIN A1C: Hgb A1c MFr Bld: 8 % — ABNORMAL HIGH (ref 4.6–6.5)

## 2014-02-12 MED ORDER — KETOCONAZOLE 2 % EX SHAM: 1.0000 "application " | MEDICATED_SHAMPOO | CUTANEOUS | Status: DC

## 2014-02-12 MED ORDER — AZITHROMYCIN 250 MG PO TABS: ORAL_TABLET | ORAL | Status: DC

## 2014-02-12 MED ORDER — OLMESARTAN-AMLODIPINE-HCTZ 40-5-25 MG PO TABS: 1.0000 | ORAL_TABLET | Freq: Every day | ORAL | Status: DC

## 2014-02-12 MED ORDER — ZOLPIDEM TARTRATE 10 MG PO TABS: 5.0000 mg | ORAL_TABLET | Freq: Every evening | ORAL | Status: DC | PRN

## 2014-02-12 NOTE — Assessment & Plan Note (Signed)
Wt Readings from Last 3 Encounters:  02/12/14 237 lb (107.502 kg)  07/25/13 235 lb (106.595 kg)  06/06/13 238 lb 14.4 oz (108.364 kg)

## 2014-02-12 NOTE — Assessment & Plan Note (Signed)
See Rx 

## 2014-02-12 NOTE — Assessment & Plan Note (Signed)
Continue with current prescription therapy as reflected on the Med list.  

## 2014-02-12 NOTE — Assessment & Plan Note (Signed)
Zolpidem prn 

## 2014-02-12 NOTE — Progress Notes (Signed)
   Subjective:    HPI  C/o insomnia - - dtr Luanna ColeBriana has a baby... C/o fatigue x 1 wk  The patient needs to address  chronic hypertension that has been well controlled with medicines; to address chronic  hyperlipidemia controlled with medicines as well; and to address type 2 chronic diabetes, controlled with medical treatment and diet.   Review of Systems  Constitutional: Negative for chills, activity change, appetite change, fatigue and unexpected weight change.  HENT: Negative for congestion, mouth sores, sinus pressure, trouble swallowing and voice change.   Eyes: Negative for visual disturbance.  Respiratory: Positive for chest tightness. Negative for cough, shortness of breath, wheezing and stridor.   Cardiovascular: Negative for palpitations and leg swelling.  Gastrointestinal: Negative for nausea, vomiting, abdominal pain and rectal pain.       GERD - mild   Genitourinary: Negative for urgency, frequency, hematuria, flank pain, decreased urine volume, difficulty urinating and vaginal pain.  Musculoskeletal: Negative for back pain, gait problem and myalgias.  Skin: Negative for pallor, rash and wound.  Neurological: Negative for dizziness, tremors, weakness, numbness and headaches.  Hematological: Negative for adenopathy.  Psychiatric/Behavioral: Negative for confusion, sleep disturbance, decreased concentration and agitation. The patient is not nervous/anxious.    Wt Readings from Last 3 Encounters:  02/12/14 237 lb (107.502 kg)  07/25/13 235 lb (106.595 kg)  06/06/13 238 lb 14.4 oz (108.364 kg)   BP Readings from Last 3 Encounters:  02/12/14 110/72  07/25/13 120/78  05/02/13 120/88         Objective:   Physical Exam  Constitutional: She appears well-developed. No distress.  Obese   HENT:  Head: Normocephalic.  Right Ear: External ear normal.  Left Ear: External ear normal.  Nose: Nose normal.  Mouth/Throat: Oropharynx is clear and moist.  Eyes: Conjunctivae  are normal. Pupils are equal, round, and reactive to light. Right eye exhibits no discharge. Left eye exhibits no discharge.  Neck: Normal range of motion. Neck supple. No JVD present. No tracheal deviation present. No thyromegaly present.  Cardiovascular: Normal rate, regular rhythm and normal heart sounds.   Pulmonary/Chest: No stridor. No respiratory distress. She has no wheezes.  Abdominal: Soft. Bowel sounds are normal. She exhibits no distension and no mass. There is no tenderness. There is no rebound and no guarding.  Musculoskeletal: She exhibits no edema and no tenderness.  Lymphadenopathy:    She has no cervical adenopathy.  Neurological: She displays normal reflexes. No cranial nerve deficit. She exhibits normal muscle tone. Coordination normal.  Skin: No rash noted. No erythema.  Psychiatric: She has a normal mood and affect. Her behavior is normal. Judgment and thought content normal.    Lab Results  Component Value Date   WBC 14.5* 05/02/2013   HGB 14.6 05/02/2013   HCT 42.7 05/02/2013   PLT 359.0 05/02/2013   GLUCOSE 138* 07/25/2013   CHOL 189 07/21/2012   TRIG 67.0 07/21/2012   HDL 54.00 07/21/2012   LDLDIRECT 137.4 11/04/2010   LDLCALC 122* 07/21/2012   ALT 23 07/25/2013   AST 23 07/25/2013   NA 136 07/25/2013   K 3.8 07/25/2013   CL 97 07/25/2013   CREATININE 1.0 07/25/2013   BUN 20 07/25/2013   CO2 29 07/25/2013   TSH 1.98 07/25/2013   HGBA1C 7.5* 07/25/2013         Assessment & Plan:

## 2014-02-12 NOTE — Progress Notes (Signed)
Pre visit review using our clinic review tool, if applicable. No additional management support is needed unless otherwise documented below in the visit note. 

## 2014-02-12 NOTE — Assessment & Plan Note (Signed)
3/12 R mild Zpac

## 2014-03-05 ENCOUNTER — Other Ambulatory Visit: Payer: Self-pay | Admitting: Internal Medicine

## 2014-03-05 DIAGNOSIS — E669 Obesity, unspecified: Principal | ICD-10-CM

## 2014-03-05 DIAGNOSIS — E1169 Type 2 diabetes mellitus with other specified complication: Secondary | ICD-10-CM

## 2014-03-07 ENCOUNTER — Encounter: Payer: Self-pay | Admitting: Endocrinology

## 2014-03-07 ENCOUNTER — Ambulatory Visit (INDEPENDENT_AMBULATORY_CARE_PROVIDER_SITE_OTHER): Payer: 59 | Admitting: Endocrinology

## 2014-03-07 VITALS — BP 118/82 | HR 86 | Temp 98.6°F | Ht 64.0 in | Wt 236.0 lb

## 2014-03-07 DIAGNOSIS — E119 Type 2 diabetes mellitus without complications: Secondary | ICD-10-CM

## 2014-03-07 DIAGNOSIS — E049 Nontoxic goiter, unspecified: Secondary | ICD-10-CM

## 2014-03-07 MED ORDER — ATORVASTATIN CALCIUM 10 MG PO TABS: 10.0000 mg | ORAL_TABLET | Freq: Every day | ORAL | Status: DC

## 2014-03-07 MED ORDER — CANAGLIFLOZIN 300 MG PO TABS: 1.0000 | ORAL_TABLET | Freq: Every day | ORAL | Status: DC

## 2014-03-07 NOTE — Progress Notes (Signed)
Subjective:    Patient ID: [name] [name], female    DOB: 09/15/1971, 43 y.o.   MRN: [id number]  HPI pt states DM was dx'ed in 2011; she has mild if any neuropathy of the lower extremities; she is unaware of any associated chronic complications.  she has never been on insulin.  pt says her diet and exercise are "pretty good."  Pt says cbg's vary from 120-220.  She requests a prescription for her cholesterol. Past Medical History  Diagnosis Date  . Chronic headache     Dr Vela ProseLewitt, Neg MRI 2005  . Anemia, iron deficiency   . HTN (hypertension)   . GERD (gastroesophageal reflux disease)   . Elevated glucose 2011  . Diabetes mellitus     Past Surgical History  Procedure Laterality Date  . Partial hysterectomy  2010  . Nose surgery    . Breast reduction surgery    . Cyst removed from vagina    . Induced abortion  1990-1994    x 4  . Tubal ligation    . Abdominal hysterectomy      History   Social History  . Marital Status: Single    Spouse Name: N/A    Number of Children: 2  . Years of Education: N/A   Occupational History  . Not on file.   Social History Main Topics  . Smoking status: Never Smoker   . Smokeless tobacco: Never Used  . Alcohol Use: Yes     Comment: 2 wine or mixed drinks on a weekend  . Drug Use: No  . Sexual Activity: Not on file   Other Topics Concern  . Not on file   Social History Narrative   Regular exercise - YES    Current Outpatient Prescriptions on File Prior to Visit  Medication Sig Dispense Refill  . Alogliptin-Pioglitazone 25-30 MG TABS Take 1 tablet by mouth daily.  30 tablet  11  . azithromycin (ZITHROMAX) 250 MG tablet As directed  6 tablet  0  . budesonide (RHINOCORT AQUA) 32 MCG/ACT nasal spray Place 1 spray into the nose 2 (two) times daily. One spray each nostril bid  1 Bottle  1  . butalbital-acetaminophen-caffeine (FIORICET) 50-325-40 MG per tablet Take 1-2 tablets by mouth 2 (two) times daily as needed for headache.  30  tablet  0  . Cholecalciferol 1000 UNITS tablet Take 1,000 Units by mouth daily.        Marland Kitchen. dexlansoprazole (DEXILANT) 60 MG capsule Take 1 capsule (60 mg total) by mouth every morning. For indigestion  30 capsule  11  . fexofenadine (ALLEGRA) 180 MG tablet Take 1 tablet (180 mg total) by mouth daily.  30 tablet  1  . gabapentin (NEURONTIN) 100 MG capsule Take 1 capsule (100 mg total) by mouth 3 (three) times daily.  90 capsule  3  . glucose blood (FREESTYLE TEST STRIPS) test strip Use two times daily as instructed. Dx: 250.00  100 each  6  . ketoconazole (NIZORAL) 2 % shampoo Apply 1 application topically 2 (two) times a week.  120 mL  5  . Lancets (FREESTYLE) lancets Use two times daily as instructed. Dx: 250.00  100 each  6  . metFORMIN (GLUCOPHAGE) 500 MG tablet Take 2 tablets (1,000 mg total) by mouth 2 (two) times daily with a meal.  120 tablet  11  . Olmesartan-Amlodipine-HCTZ (TRIBENZOR) 40-5-25 MG TABS 1 tablet by Per post-pyloric tube route daily.  30 tablet  11  . zolpidem (  AMBIEN) 10 MG tablet Take 0.5-1 tablets (5-10 mg total) by mouth at bedtime as needed for sleep.  30 tablet  3  . sodium chloride (OCEAN NASAL SPRAY) 0.65 % nasal spray Place 1 spray into the nose as needed for congestion.  30 mL  12   No current facility-administered medications on file prior to visit.    Allergies  Allergen Reactions  . Ciprofloxacin     REACTION: gittery    Family History  Problem Relation Age of Onset  . Hypertension Mother   . Cancer Mother 8    Esophageal  . Hypertension Maternal Grandmother   DM: 2 sibs  BP 118/82  Pulse 86  Temp(Src) 98.6 F (37 C) (Oral)  Ht 5\' 4"  (1.626 m)  Wt 236 lb (107.049 kg)  BMI 40.49 kg/m2  SpO2 98%    Review of Systems denies weight loss, blurry vision, chest pain, sob, n/v, urinary frequency, cramps, memory loss, depression, cold intolerance, and easy bruising.  She has chronic headache, rhinorrhea, and night sweats.      Objective:    Physical Exam VS: see vs page GEN: no distress HEAD: head: no deformity eyes: no periorbital swelling, no proptosis external nose and ears are normal mouth: no lesion seen NECK: ? Of fullness of the right thyroid lobe. CHEST WALL: no deformity LUNGS:  Clear to auscultation CV: reg rate and rhythm, no murmur ABD: abdomen is soft, nontender.  no hepatosplenomegaly.  not distended.  no hernia.  MUSCULOSKELETAL: muscle bulk and strength are grossly normal.  no obvious joint swelling.  gait is normal and steady PULSES: no carotid bruit NEURO:  cn 2-12 grossly intact.   readily moves all 4's.  SKIN:  Normal texture and temperature.  No rash or suspicious lesion is visible.   NODES:  None palpable at the neck.   PSYCH: alert, well-oriented.  Does not appear anxious nor depressed.  Lab Results  Component Value Date   HGBA1C 8.0* 02/12/2014   Lab Results  Component Value Date   CHOL 189 07/21/2012   HDL 54.00 07/21/2012   LDLCALC 122* 07/21/2012   LDLDIRECT 137.4 11/04/2010   TRIG 67.0 07/21/2012   CHOLHDL 4 07/21/2012      Assessment & Plan:  DM: she needs increased rx Dslipidemia: she needs increased rx Goiter, mild

## 2014-03-07 NOTE — Patient Instructions (Addendum)
good diet and exercise habits significanly improve the control of your diabetes.  please let me know if you wish to be referred to a dietician, or for weight-loss surgery.  high blood sugar is very risky to your health.  you should see an eye doctor every year.  You are at higher than average risk for pneumonia and hepatitis-B.  You should be vaccinated against both.   controlling your blood pressure and cholesterol drastically reduces the damage diabetes does to your body.  this also applies to quitting smoking.  please discuss these with your doctor.  check your blood sugar once a day.  vary the time of day when you check, between before the 3 meals, and at bedtime.  also check if you have symptoms of your blood sugar being too high or too low.  please keep a record of the readings and bring it to your next appointment here.  You can write it on any piece of paper.  please call us sooner if your blood sugar goes below 70, or if you have a lot of readings over 200. Let's check the ultrasound of the neck.  you will receive a phone call, about a day and time for an appointment.   i have sent a prescription to your pharmacy, to add "invokana."  Drink plenty of fluids while on this.   Please come back for a follow-up appointment in 3 months. i have also sent a prescription to your pharmacy, for the cholesterol.

## 2014-03-08 ENCOUNTER — Other Ambulatory Visit: Payer: Self-pay

## 2014-03-08 DIAGNOSIS — Z1231 Encounter for screening mammogram for malignant neoplasm of breast: Secondary | ICD-10-CM

## 2014-03-09 ENCOUNTER — Ambulatory Visit
Admission: RE | Admit: 2014-03-09 | Discharge: 2014-03-09 | Disposition: A | Discharge status: Home / Self Care | Payer: 59 | Source: Ambulatory Visit | Attending: Endocrinology | Admitting: Endocrinology

## 2014-03-09 DIAGNOSIS — E049 Nontoxic goiter, unspecified: Secondary | ICD-10-CM

## 2014-03-20 ENCOUNTER — Ambulatory Visit: Payer: 59

## 2014-05-11 ENCOUNTER — Ambulatory Visit (INDEPENDENT_AMBULATORY_CARE_PROVIDER_SITE_OTHER): Payer: 59 | Admitting: Internal Medicine

## 2014-05-11 VITALS — BP 108/78 | HR 89 | Temp 98.3°F | Wt 234.5 lb

## 2014-05-11 DIAGNOSIS — J069 Acute upper respiratory infection, unspecified: Secondary | ICD-10-CM | POA: Insufficient documentation

## 2014-05-11 MED ORDER — AZITHROMYCIN 250 MG PO TABS: ORAL_TABLET | ORAL | Status: DC

## 2014-05-11 MED ORDER — PROMETHAZINE-CODEINE 6.25-10 MG/5ML PO SYRP: 5.0000 mL | ORAL_SOLUTION | ORAL | Status: DC | PRN

## 2014-05-11 NOTE — Progress Notes (Signed)
Subjective:    Sore Throat  This is a new problem. The current episode started in the past 7 days. Pertinent negatives include no abdominal pain, congestion, coughing, headaches, shortness of breath, stridor, trouble swallowing or vomiting.  Lost voice    The patient needs to address  chronic hypertension that has been well controlled with medicines; to address chronic  hyperlipidemia controlled with medicines as well; and to address type 2 chronic diabetes, controlled with medical treatment and diet.   Review of Systems  Constitutional: Negative for chills, activity change, appetite change, fatigue and unexpected weight change.  HENT: Negative for congestion, mouth sores, sinus pressure, trouble swallowing and voice change.   Eyes: Negative for visual disturbance.  Respiratory: Positive for chest tightness. Negative for cough, shortness of breath, wheezing and stridor.   Cardiovascular: Negative for palpitations and leg swelling.  Gastrointestinal: Negative for nausea, vomiting, abdominal pain and rectal pain.       GERD - mild   Genitourinary: Negative for urgency, frequency, hematuria, flank pain, decreased urine volume, difficulty urinating and vaginal pain.  Musculoskeletal: Negative for back pain, gait problem and myalgias.  Skin: Negative for pallor, rash and wound.  Neurological: Negative for dizziness, tremors, weakness, numbness and headaches.  Hematological: Negative for adenopathy.  Psychiatric/Behavioral: Negative for confusion, sleep disturbance, decreased concentration and agitation. The patient is not nervous/anxious.    Wt Readings from Last 3 Encounters:  05/11/14 234 lb 8 oz (106.369 kg)  03/07/14 236 lb (107.049 kg)  02/12/14 237 lb (107.502 kg)   BP Readings from Last 3 Encounters:  05/11/14 108/78  03/07/14 118/82  02/12/14 110/72         Objective:   Physical Exam  Constitutional: She appears well-developed. No distress.  Obese   HENT:  Head:  Normocephalic.  Right Ear: External ear normal.  Left Ear: External ear normal.  Nose: Nose normal.  Mouth/Throat: Oropharynx is clear and moist.  Eyes: Conjunctivae are normal. Pupils are equal, round, and reactive to light. Right eye exhibits no discharge. Left eye exhibits no discharge.  Neck: Normal range of motion. Neck supple. No JVD present. No tracheal deviation present. No thyromegaly present.  Cardiovascular: Normal rate, regular rhythm and normal heart sounds.   Pulmonary/Chest: No stridor. No respiratory distress. She has no wheezes.  Abdominal: Soft. Bowel sounds are normal. She exhibits no distension and no mass. There is no tenderness. There is no rebound and no guarding.  Musculoskeletal: She exhibits no edema and no tenderness.  Lymphadenopathy:    She has no cervical adenopathy.  Neurological: She displays normal reflexes. No cranial nerve deficit. She exhibits normal muscle tone. Coordination normal.  Skin: No rash noted. No erythema.  Psychiatric: She has a normal mood and affect. Her behavior is normal. Judgment and thought content normal.    Lab Results  Component Value Date   WBC 14.5* 05/02/2013   HGB 14.6 05/02/2013   HCT 42.7 05/02/2013   PLT 359.0 05/02/2013   GLUCOSE 92 02/12/2014   CHOL 189 07/21/2012   TRIG 67.0 07/21/2012   HDL 54.00 07/21/2012   LDLDIRECT 137.4 11/04/2010   LDLCALC 122* 07/21/2012   ALT 23 07/25/2013   AST 23 07/25/2013   NA 138 02/12/2014   K 3.7 02/12/2014   CL 103 02/12/2014   CREATININE 0.8 02/12/2014   BUN 16 02/12/2014   CO2 30 02/12/2014   TSH 1.98 07/25/2013   HGBA1C 8.0* 02/12/2014         Assessment &  Plan:

## 2014-05-11 NOTE — Progress Notes (Signed)
Pre visit review using our clinic review tool, if applicable. No additional management support is needed unless otherwise documented below in the visit note. 

## 2014-05-11 NOTE — Assessment & Plan Note (Signed)
Laryngitis 5/15

## 2014-05-11 NOTE — Patient Instructions (Signed)
Use Ricola, Mucinex Voice rest

## 2014-05-13 ENCOUNTER — Encounter: Payer: Self-pay | Admitting: Internal Medicine

## 2014-06-07 ENCOUNTER — Other Ambulatory Visit: Payer: Self-pay

## 2014-06-07 DIAGNOSIS — Z1231 Encounter for screening mammogram for malignant neoplasm of breast: Secondary | ICD-10-CM

## 2014-06-07 DIAGNOSIS — Z9889 Other specified postprocedural states: Secondary | ICD-10-CM

## 2014-06-12 ENCOUNTER — Ambulatory Visit: Payer: 59 | Admitting: Endocrinology

## 2014-06-12 ENCOUNTER — Ambulatory Visit
Admission: RE | Admit: 2014-06-12 | Discharge: 2014-06-12 | Disposition: A | Discharge status: Home / Self Care | Payer: 59 | Source: Ambulatory Visit

## 2014-06-12 DIAGNOSIS — Z1231 Encounter for screening mammogram for malignant neoplasm of breast: Secondary | ICD-10-CM

## 2014-06-12 DIAGNOSIS — Z9889 Other specified postprocedural states: Secondary | ICD-10-CM

## 2014-06-16 ENCOUNTER — Emergency Department (HOSPITAL_COMMUNITY)
Admission: EM | Admit: 2014-06-16 | Discharge: 2014-06-16 | Disposition: A | Discharge status: Home / Self Care | Payer: 59 | Source: Home / Self Care

## 2014-06-16 ENCOUNTER — Encounter (HOSPITAL_COMMUNITY): Payer: Self-pay | Admitting: Emergency Medicine

## 2014-06-16 DIAGNOSIS — B009 Herpesviral infection, unspecified: Secondary | ICD-10-CM

## 2014-06-16 MED ORDER — VALACYCLOVIR HCL 1 G PO TABS: ORAL_TABLET | ORAL | Status: DC

## 2014-06-16 MED ORDER — PENCICLOVIR 1 % EX CREA: 1.0000 "application " | TOPICAL_CREAM | CUTANEOUS | Status: DC

## 2014-06-16 NOTE — ED Notes (Signed)
Concern for lip turning dark, tingling . Wants to know why, unsure of cause, other than dark colored drink she had a couple of days ago

## 2014-06-16 NOTE — ED Provider Notes (Signed)
CSN: [id number]     Arrival date & time 06/16/14  1312 History   First MD Initiated Contact with Patient 06/16/14 1427     Chief Complaint  Patient presents with  . Oral Swelling   (Consider location/radiation/quality/duration/timing/severity/associated sxs/prior Treatment) HPI Comments: For 2 days notice a darkening of the pigmentation of the lower lip. Now associated with mild tenderness, numbness and tingling. Hx of facial shingles and st this feels like a nerve pain.    Past Medical History  Diagnosis Date  . Chronic headache     Dr Vela ProseLewitt, Neg MRI 2005  . Anemia, iron deficiency   . HTN (hypertension)   . GERD (gastroesophageal reflux disease)   . Elevated glucose 2011  . Diabetes mellitus    Past Surgical History  Procedure Laterality Date  . Partial hysterectomy  2010  . Nose surgery    . Breast reduction surgery    . Cyst removed from vagina    . Induced abortion  1990-1994    x 4  . Tubal ligation    . Abdominal hysterectomy     Family History  Problem Relation Age of Onset  . Hypertension Mother   . Cancer Mother [id number]    Esophageal  . Hypertension Maternal Grandmother    History  Substance Use Topics  . Smoking status: Never Smoker   . Smokeless tobacco: Never Used  . Alcohol Use: Yes     Comment: 2 wine or mixed drinks on a weekend   OB History   Grav Para Term Preterm Abortions TAB SAB Ect Mult Living                 Review of Systems  All other systems reviewed and are negative.   Allergies  Ciprofloxacin  Home Medications   Prior to Admission medications   Medication Sig Start Date End Date Taking? Authorizing Provider  Alogliptin-Pioglitazone 25-30 MG TABS Take 1 tablet by mouth daily. 07/26/13   Aleksei Plotnikov V, MD  atorvastatin (LIPITOR) 10 MG tablet Take 1 tablet (10 mg total) by mouth daily. 03/07/14   Romero BellingSean Ellison, MD  azithromycin (ZITHROMAX) 250 MG tablet As directed 05/11/14   Jacinta ShoeAleksei Plotnikov V, MD  budesonide (RHINOCORT AQUA) 32  MCG/ACT nasal spray Place 1 spray into the nose 2 (two) times daily. One spray each nostril bid 04/24/13   Linna HoffJames D Kindl, MD  butalbital-acetaminophen-caffeine (FIORICET) 657 873 162750-325-40 MG per tablet Take 1-2 tablets by mouth 2 (two) times daily as needed for headache. 02/14/13   Aleksei Plotnikov V, MD  Canagliflozin (INVOKANA) 300 MG TABS Take 1 tablet (300 mg total) by mouth daily. 03/07/14   Romero BellingSean Ellison, MD  Cholecalciferol 1000 UNITS tablet Take 1,000 Units by mouth daily.      Historical Provider, MD  dexlansoprazole (DEXILANT) 60 MG capsule Take 1 capsule (60 mg total) by mouth every morning. For indigestion 01/30/13   Aleksei Plotnikov V, MD  fexofenadine (ALLEGRA) 180 MG tablet Take 1 tablet (180 mg total) by mouth daily. 04/24/13   Linna HoffJames D Kindl, MD  gabapentin (NEURONTIN) 100 MG capsule Take 1 capsule (100 mg total) by mouth 3 (three) times daily. 10/13/12   Aleksei Plotnikov V, MD  glucose blood (FREESTYLE TEST STRIPS) test strip Use two times daily as instructed. Dx: 250.00 03/30/13   Aleksei Plotnikov V, MD  ketoconazole (NIZORAL) 2 % shampoo Apply 1 application topically 2 (two) times a week. 02/12/14   Aleksei Plotnikov V, MD  Lancets (FREESTYLE) lancets Use two times  daily as instructed. Dx: 250.00 03/30/13   Tresa GarterAleksei Plotnikov V, MD  metFORMIN (GLUCOPHAGE) 500 MG tablet Take 2 tablets (1,000 mg total) by mouth 2 (two) times daily with a meal. 02/14/13   Aleksei Plotnikov V, MD  Olmesartan-Amlodipine-HCTZ (TRIBENZOR) 40-5-25 MG TABS 1 tablet by Per post-pyloric tube route daily. 02/12/14   Aleksei Plotnikov V, MD  penciclovir (DENAVIR) 1 % cream Apply 1 application topically every 2 (two) hours. 06/16/14   Hayden Rasmussenavid Cassidey Barrales, NP  promethazine-codeine (PHENERGAN WITH CODEINE) 6.25-10 MG/5ML syrup Take 5 mLs by mouth every 4 (four) hours as needed for cough. 05/11/14   Aleksei Plotnikov V, MD  sodium chloride (OCEAN NASAL SPRAY) 0.65 % nasal spray Place 1 spray into the nose as needed for congestion. 08/16/12 08/16/13   Johnsie Kindredarmen L Chatten, NP  valACYclovir (VALTREX) 1000 MG tablet 2 tablets today and 2 tablets po tomorrow 06/16/14   Hayden Rasmussenavid Kydan Shanholtzer, NP  zolpidem (AMBIEN) 10 MG tablet Take 0.5-1 tablets (5-10 mg total) by mouth at bedtime as needed for sleep. 02/12/14 03/14/14  Aleksei Plotnikov V, MD   BP 157/97  Pulse 90  Temp(Src) 97.6 F (36.4 C) (Oral)  Resp 18  SpO2 97% Physical Exam  Nursing note and vitals reviewed. Constitutional: She is oriented to person, place, and time. She appears well-developed and well-nourished. No distress.  HENT:  Mouth/Throat: Oropharynx is clear and moist.  Lower lip with 3-4 areas of darkened pigmentation. [place] affected only, Lower lip appears dry, chapped. Tender to palpation. Not much swelling appreciated.  Eyes: Conjunctivae and EOM are normal.  Neurological: She is alert and oriented to person, place, and time.  Skin: Skin is warm and dry.  Psychiatric: She has a normal mood and affect.    ED Course  Procedures (including critical care time) Labs Review Labs Reviewed - No data to display  Imaging Review No results found.   MDM   1. Herpetic lesions     Denavir cream as dir Valtrex 2 gm today and tomorrow  F/U with PCP    Hayden Rasmussenavid Natthew Marlatt, NP 06/16/14 1448

## 2014-06-16 NOTE — Discharge Instructions (Signed)
Herpes Simplex Herpes simplex is generally classified as Type 1 or Type 2. Type 1 is generally the type that is responsible for cold sores. Type 2 is generally associated with sexually transmitted diseases. We now know that most of the thoughts on these viruses are inaccurate. We find that HSV1 is also present genitally and HSV2 can be present orally, but this will vary in different locations of the world. Herpes simplex is usually detected by doing a culture. Blood tests are also available for this virus; however, the accuracy is often not as good.  PREPARATION FOR TEST No preparation or fasting is necessary. NORMAL FINDINGS  No virus present  No HSV antigens or antibodies present Ranges for normal findings may vary among different laboratories and hospitals. You should always check with your doctor after having lab work or other tests done to discuss the meaning of your test results and whether your values are considered within normal limits. MEANING OF TEST  Your caregiver will go over the test results with you and discuss the importance and meaning of your results, as well as treatment options and the need for additional tests if necessary. OBTAINING THE TEST RESULTS  It is your responsibility to obtain your test results. Ask the lab or department performing the test when and how you will get your results. Document Released: 01/02/2005 Document Revised: 02/22/2012 Document Reviewed: 11/10/2008 [facility] Patient Information 2015 [place], [place]. This information is not intended to replace advice given to you by your health care provider. Make sure you discuss any questions you have with your health care provider.  Viral Exanthems, Adult A viral exanthem is a rash. It occurs when a type of germ (virus) infects the skin. It usually goes away on its own, without treatment. HOME CARE  Only take medicines as told by your doctor.  Drink enough water and fluids to keep your pee (urine) clear or  pale yellow. GET HELP RIGHT AWAY IF:  You feel lumps or bumps in the neck.  Your face feels tender near the eyes and nose (sinuses).  You are not getting better after 3 days.  You have muscle aches.  You feel very tired.  You have a cough and thick spit (mucus).  You have a fever.  You have red eyes or eye pain.  You have sores in your mouth and trouble drinking or eating.  You have a sore throat with yellowish-white fluid (pus) and trouble swallowing.  You have neck pain or a stiff neck.  You get a severe headache.  You cannot stop throwing up (vomiting). MAKE SURE YOU:  Understand these instructions.  Will watch your condition.  Will get help right away if you are not doing well or get worse. Document Released: 03/17/2011 Document Revised: 02/22/2012 Document Reviewed: 03/17/2011 [facility] Patient Information 2015 [place], [place]. This information is not intended to replace advice given to you by your health care provider. Make sure you discuss any questions you have with your health care provider.

## 2014-06-17 NOTE — ED Provider Notes (Signed)
Medical screening examination/treatment/procedure(s) were performed by non-physician practitioner and as supervising physician I was immediately available for consultation/collaboration.  Leslee Homeavid Akiva Josey, M.D.  Reuben Likesavid C Dacoda Finlay, MD 06/17/14 [phone]

## 2014-06-18 ENCOUNTER — Ambulatory Visit (INDEPENDENT_AMBULATORY_CARE_PROVIDER_SITE_OTHER): Payer: 59 | Admitting: Internal Medicine

## 2014-06-18 ENCOUNTER — Encounter: Payer: Self-pay | Admitting: Internal Medicine

## 2014-06-18 ENCOUNTER — Telehealth: Payer: Self-pay

## 2014-06-18 VITALS — BP 158/100 | HR 76 | Temp 98.2°F | Resp 16 | Wt 230.0 lb

## 2014-06-18 DIAGNOSIS — E119 Type 2 diabetes mellitus without complications: Secondary | ICD-10-CM

## 2014-06-18 DIAGNOSIS — R600 Localized edema: Secondary | ICD-10-CM | POA: Insufficient documentation

## 2014-06-18 DIAGNOSIS — K130 Diseases of lips: Secondary | ICD-10-CM

## 2014-06-18 MED ORDER — TRIAMCINOLONE ACETONIDE 0.5 % EX OINT: 1.0000 "application " | TOPICAL_OINTMENT | Freq: Four times a day (QID) | CUTANEOUS | Status: DC

## 2014-06-18 NOTE — Progress Notes (Signed)
   Subjective:    HPI  C/o B lip swelling x 3 d [facility] with strawberry vinaigrette and strawberries   The patient needs to address  chronic hypertension that has been well controlled with medicines; to address chronic  hyperlipidemia controlled with medicines as well; and to address type 2 chronic diabetes, controlled with medical treatment and diet.   Review of Systems  Constitutional: Negative for chills, activity change, appetite change, fatigue and unexpected weight change.  HENT: Negative for mouth sores, sinus pressure and voice change.   Eyes: Negative for visual disturbance.  Respiratory: Positive for chest tightness. Negative for wheezing.   Cardiovascular: Negative for palpitations and leg swelling.  Gastrointestinal: Negative for nausea and rectal pain.       GERD - mild   Genitourinary: Negative for urgency, frequency, hematuria, flank pain, decreased urine volume, difficulty urinating and vaginal pain.  Musculoskeletal: Negative for back pain, gait problem and myalgias.  Skin: Negative for pallor, rash and wound.  Neurological: Negative for dizziness, tremors, weakness and numbness.  Hematological: Negative for adenopathy.  Psychiatric/Behavioral: Negative for confusion, sleep disturbance, decreased concentration and agitation. The patient is not nervous/anxious.    Wt Readings from Last 3 Encounters:  06/18/14 230 lb (104.327 kg)  05/11/14 234 lb 8 oz (106.369 kg)  03/07/14 236 lb (107.049 kg)   BP Readings from Last 3 Encounters:  06/18/14 158/100  06/16/14 157/97  05/11/14 108/78         Objective:   Physical Exam  Constitutional: She appears well-developed. No distress.  Obese   HENT:  Head: Normocephalic.  Right Ear: External ear normal.  Left Ear: External ear normal.  Nose: Nose normal.  Mouth/Throat: Oropharynx is clear and moist.  Eyes: Conjunctivae are normal. Pupils are equal, round, and reactive to light. Right eye  exhibits no discharge. Left eye exhibits no discharge.  Neck: Normal range of motion. Neck supple. No JVD present. No tracheal deviation present. No thyromegaly present.  Cardiovascular: Normal rate, regular rhythm and normal heart sounds.   Pulmonary/Chest: No stridor. No respiratory distress. She has no wheezes.  Abdominal: Soft. Bowel sounds are normal. She exhibits no distension and no mass. There is no tenderness. There is no rebound and no guarding.  Musculoskeletal: She exhibits no edema and no tenderness.  Lymphadenopathy:    She has no cervical adenopathy.  Neurological: She displays normal reflexes. No cranial nerve deficit. She exhibits normal muscle tone. Coordination normal.  Skin: No rash noted. No erythema.  Psychiatric: She has a normal mood and affect. Her behavior is normal. Judgment and thought content normal.  swollen, irritated lower lip  Lab Results  Component Value Date   WBC 14.5* 05/02/2013   HGB 14.6 05/02/2013   HCT 42.7 05/02/2013   PLT 359.0 05/02/2013   GLUCOSE 92 02/12/2014   CHOL 189 07/21/2012   TRIG 67.0 07/21/2012   HDL 54.00 07/21/2012   LDLDIRECT 137.4 11/04/2010   LDLCALC 122* 07/21/2012   ALT 23 07/25/2013   AST 23 07/25/2013   NA 138 02/12/2014   K 3.7 02/12/2014   CL 103 02/12/2014   CREATININE 0.8 02/12/2014   BUN 16 02/12/2014   CO2 30 02/12/2014   TSH 1.98 07/25/2013   HGBA1C 8.0* 02/12/2014         Assessment & Plan:

## 2014-06-18 NOTE — Progress Notes (Deleted)
Pre visit review using our clinic review tool, if applicable. No additional management support is needed unless otherwise documented below in the visit note. 

## 2014-06-18 NOTE — Telephone Encounter (Signed)
error 

## 2014-06-18 NOTE — Assessment & Plan Note (Signed)
7/15 allergic - likely due to a salad dressing  No new meds  Benadryl Sudafed Triamcinolone qid

## 2014-07-03 ENCOUNTER — Ambulatory Visit (INDEPENDENT_AMBULATORY_CARE_PROVIDER_SITE_OTHER): Payer: 59 | Admitting: Endocrinology

## 2014-07-03 ENCOUNTER — Encounter: Payer: Self-pay | Admitting: Endocrinology

## 2014-07-03 VITALS — BP 116/74 | HR 84 | Temp 97.6°F | Ht 64.0 in | Wt 233.0 lb

## 2014-07-03 DIAGNOSIS — E049 Nontoxic goiter, unspecified: Secondary | ICD-10-CM

## 2014-07-03 DIAGNOSIS — E119 Type 2 diabetes mellitus without complications: Secondary | ICD-10-CM

## 2014-07-03 LAB — BASIC METABOLIC PANEL
BUN: 10 mg/dL (ref 6–23)
CALCIUM: 9.4 mg/dL (ref 8.4–10.5)
CO2: 27 meq/L (ref 19–32)
CREATININE: 0.8 mg/dL (ref 0.4–1.2)
Chloride: 97 mEq/L (ref 96–112)
GFR: 103.4 mL/min (ref >60.00)
GLUCOSE: 92 mg/dL (ref 70–99)
Potassium: 3.6 mEq/L (ref 3.5–5.1)
Sodium: 135 mEq/L (ref 135–145)

## 2014-07-03 LAB — MICROALBUMIN / CREATININE URINE RATIO
Creatinine,U: 50.8 mg/dL
Microalb Creat Ratio: 33.8 mg/g — ABNORMAL HIGH (ref 0.0–30.0)
Microalb, Ur: 17.2 mg/dL — ABNORMAL HIGH (ref 0.0–1.9)

## 2014-07-03 LAB — HEMOGLOBIN A1C: HEMOGLOBIN A1C: 7.6 % — AB (ref 4.6–6.5)

## 2014-07-03 NOTE — Progress Notes (Signed)
Subjective:    Patient ID: [name] [name], female    DOB: [birth year], 43 y.o.   MRN: [id number]  HPI pt states DM was dx'ed in 2011; she has mild if any neuropathy of the lower extremities, but she has associated nephropathy.  she has never been on insulin.  She takes 4 oral DM meds.  She declines weight-loss surgery. She does not check cbg's.  pt states she feels well in general.   Past Medical History  Diagnosis Date  . Chronic headache     Dr Vela Prose, Neg MRI 2005  . Anemia, iron deficiency   . HTN (hypertension)   . GERD (gastroesophageal reflux disease)   . Elevated glucose 2011  . Diabetes mellitus     Past Surgical History  Procedure Laterality Date  . Partial hysterectomy  2010  . Nose surgery    . Breast reduction surgery    . Cyst removed from vagina    . Induced abortion  1990-1994    x 4  . Tubal ligation    . Abdominal hysterectomy      History   Social History  . Marital Status: Single    Spouse Name: N/A    Number of Children: 2  . Years of Education: N/A   Occupational History  . Not on file.   Social History Main Topics  . Smoking status: Never Smoker   . Smokeless tobacco: Never Used  . Alcohol Use: Yes     Comment: 2 wine or mixed drinks on a weekend  . Drug Use: No  . Sexual Activity: Not on file   Other Topics Concern  . Not on file   Social History Narrative   Regular exercise - YES    Current Outpatient Prescriptions on File Prior to Visit  Medication Sig Dispense Refill  . Alogliptin-Pioglitazone 25-30 MG TABS Take 1 tablet by mouth daily.  30 tablet  11  . atorvastatin (LIPITOR) 10 MG tablet Take 1 tablet (10 mg total) by mouth daily.  90 tablet  3  . budesonide (RHINOCORT AQUA) 32 MCG/ACT nasal spray Place 1 spray into the nose 2 (two) times daily. One spray each nostril bid  1 Bottle  1  . butalbital-acetaminophen-caffeine (FIORICET) 50-325-40 MG per tablet Take 1-2 tablets by mouth 2 (two) times daily as needed for headache.   30 tablet  0  . Canagliflozin (INVOKANA) 300 MG TABS Take 1 tablet (300 mg total) by mouth daily.  30 tablet  11  . Cholecalciferol 1000 UNITS tablet Take 1,000 Units by mouth daily.        Marland Kitchen dexlansoprazole (DEXILANT) 60 MG capsule Take 1 capsule (60 mg total) by mouth every morning. For indigestion  30 capsule  11  . fexofenadine (ALLEGRA) 180 MG tablet Take 1 tablet (180 mg total) by mouth daily.  30 tablet  1  . gabapentin (NEURONTIN) 100 MG capsule Take 1 capsule (100 mg total) by mouth 3 (three) times daily.  90 capsule  3  . glucose blood (FREESTYLE TEST STRIPS) test strip Use two times daily as instructed. Dx: 250.00  100 each  6  . ketoconazole (NIZORAL) 2 % shampoo Apply 1 application topically 2 (two) times a week.  120 mL  5  . Lancets (FREESTYLE) lancets Use two times daily as instructed. Dx: 250.00  100 each  6  . metFORMIN (GLUCOPHAGE) 500 MG tablet Take 2 tablets (1,000 mg total) by mouth 2 (two) times daily with a meal.  120 tablet  11  . Olmesartan-Amlodipine-HCTZ (TRIBENZOR) 40-5-25 MG TABS 1 tablet by Per post-pyloric tube route daily.  30 tablet  11  . penciclovir (DENAVIR) 1 % cream Apply 1 application topically every 2 (two) hours.  1.5 g  0  . promethazine-codeine (PHENERGAN WITH CODEINE) 6.25-10 MG/5ML syrup Take 5 mLs by mouth every 4 (four) hours as needed for cough.  300 mL  0  . triamcinolone ointment (KENALOG) 0.5 % Apply 1 application topically 4 (four) times daily. To lips  15 g  1  . valACYclovir (VALTREX) 1000 MG tablet 2 tablets today and 2 tablets po tomorrow  4 tablet  0  . sodium chloride (OCEAN NASAL SPRAY) 0.65 % nasal spray Place 1 spray into the nose as needed for congestion.  30 mL  12  . zolpidem (AMBIEN) 10 MG tablet Take 0.5-1 tablets (5-10 mg total) by mouth at bedtime as needed for sleep.  30 tablet  3   No current facility-administered medications on file prior to visit.    Allergies  Allergen Reactions  . Ciprofloxacin     REACTION: gittery     Family History  Problem Relation Age of Onset  . Hypertension Mother   . Cancer Mother [phone]    Esophageal  . Hypertension Maternal Grandmother     BP 116/74  Pulse 84  Temp(Src) 97.6 F (36.4 C) (Oral)  Ht 5\' 4"  (1.626 m)  Wt 233 lb (105.688 kg)  BMI 39.97 kg/m2  SpO2 96%   Review of Systems She denies hypoglycemia and weight change.    Objective:   Physical Exam VITAL SIGNS:  See vs page GENERAL: no distress Pulses: dorsalis pedis intact bilat.   Feet: no deformity. normal color and temp.  no edema Skin:  no ulcer on the feet.   Neuro: sensation is intact to touch on the feet.     Lab Results  Component Value Date   HGBA1C 7.6* 07/03/2014      Assessment & Plan:  DM: mild exacerbation.   Nephropathy: persistent. Multinodular goiter: she needs TFT monitoring.   Patient is advised the following: Patient Instructions  check your blood sugar once a day.  vary the time of day when you check, between before the 3 meals, and at bedtime.  also check if you have symptoms of your blood sugar being too high or too low.  please keep a record of the readings and bring it to your next appointment here.  You can write it on any piece of paper.  please call us sooner if your blood sugar goes below 70, or if you have a lot of readings over 200. Please come back for a follow-up appointment in 3 months. You should have the thyroid ultrasound rechecked early next year. most of the time, a "lumpy thyroid" will eventually become overactive.  this is usually a slow process, happening over the span of years.  blood tests are being requested for you today.  We'll contact you with results.  If it is high, we can add "bromocriptine."

## 2014-07-03 NOTE — Patient Instructions (Addendum)
check your blood sugar once a day.  vary the time of day when you check, between before the 3 meals, and at bedtime.  also check if you have symptoms of your blood sugar being too high or too low.  please keep a record of the readings and bring it to your next appointment here.  You can write it on any piece of paper.  please call us sooner if your blood sugar goes below 70, or if you have a lot of readings over 200. Please come back for a follow-up appointment in 3 months. You should have the thyroid ultrasound rechecked early next year. most of the time, a "lumpy thyroid" will eventually become overactive.  this is usually a slow process, happening over the span of years.  blood tests are being requested for you today.  We'll contact you with results.  If it is high, we can add "bromocriptine."

## 2014-07-04 MED ORDER — BROMOCRIPTINE MESYLATE 2.5 MG PO TABS: 1.2500 mg | ORAL_TABLET | Freq: Every day | ORAL | Status: DC

## 2014-10-03 ENCOUNTER — Ambulatory Visit: Payer: 59 | Admitting: Endocrinology

## 2014-10-22 ENCOUNTER — Encounter: Payer: Self-pay | Admitting: Family Medicine

## 2014-10-22 ENCOUNTER — Ambulatory Visit (INDEPENDENT_AMBULATORY_CARE_PROVIDER_SITE_OTHER): Payer: 59 | Admitting: Family Medicine

## 2014-10-22 VITALS — BP 150/100 | HR 74 | Temp 99.0°F | Wt 225.0 lb

## 2014-10-22 DIAGNOSIS — K644 Residual hemorrhoidal skin tags: Secondary | ICD-10-CM

## 2014-10-22 DIAGNOSIS — K648 Other hemorrhoids: Secondary | ICD-10-CM

## 2014-10-22 MED ORDER — HYDROCORTISONE 2.5 % RE CREA: 1.0000 "application " | TOPICAL_CREAM | Freq: Two times a day (BID) | RECTAL | Status: DC

## 2014-10-22 NOTE — Progress Notes (Signed)
Pre visit review using our clinic review tool, if applicable. No additional management support is needed unless otherwise documented below in the visit note.  Pain started yesterday or the day before. No blood in stool. No FCNAV.  Rectal pain, 5/10, about the same over the last 24h.  H/o similar years ago, due to constipation at the time.  H/o hemorrhoids in the past.  No new meds.  Tried OTC prep H, w/o a lot of help.  Meds, vitals, and allergies reviewed.   ROS: See HPI.  Otherwise, noncontributory.  nad Chaperoned exam.  Soft single external hemorrhoid, tender but not bleeding.

## 2014-10-22 NOTE — Patient Instructions (Signed)
Sit on a pillow, avoid straining, use a stool softener if needed, and use the cream twice a day.

## 2014-10-23 DIAGNOSIS — K644 Residual hemorrhoidal skin tags: Secondary | ICD-10-CM | POA: Insufficient documentation

## 2014-10-23 NOTE — Assessment & Plan Note (Signed)
Likely beyond the window to benefit from thrombectomy.  D/w pt.  Limit straining, use topical anusol and f/u prn.  Should improve in the next few days.  To PCP as FYI.

## 2014-11-12 ENCOUNTER — Emergency Department (HOSPITAL_COMMUNITY)
Admission: EM | Admit: 2014-11-12 | Discharge: 2014-11-12 | Disposition: A | Discharge status: Home / Self Care | Payer: 59 | Source: Home / Self Care | Attending: Emergency Medicine | Admitting: Emergency Medicine

## 2014-11-12 ENCOUNTER — Encounter (HOSPITAL_COMMUNITY): Payer: Self-pay | Admitting: *Deleted

## 2014-11-12 DIAGNOSIS — R21 Rash and other nonspecific skin eruption: Secondary | ICD-10-CM

## 2014-11-12 DIAGNOSIS — B029 Zoster without complications: Secondary | ICD-10-CM

## 2014-11-12 MED ORDER — VALACYCLOVIR HCL 1 G PO TABS: 1000.0000 mg | ORAL_TABLET | Freq: Three times a day (TID) | ORAL | Status: AC
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
## 2014-11-12 NOTE — Discharge Instructions (Signed)

## 2014-11-12 NOTE — ED Provider Notes (Signed)
CSN: [id number]     Arrival date & time 11/12/14  [id number] History   First MD Initiated Contact with Patient 11/12/14 0908     No chief complaint on file.  (Consider location/radiation/quality/duration/timing/severity/associated sxs/prior Treatment) HPI Comments: 4 d ago pt noticed a small group of 4 papulovesicular lesions that are now coalescing. Describes burning, tingling and itching at the site. No other lesions. Hx of similar lesion to the face about 1-2 yr ago and dx with shingles.   Past Medical History  Diagnosis Date  . Chronic headache     Dr Vela ProseLewitt, Neg MRI 2005  . Anemia, iron deficiency   . HTN (hypertension)   . GERD (gastroesophageal reflux disease)   . Elevated glucose 2011  . Diabetes mellitus    Past Surgical History  Procedure Laterality Date  . Partial hysterectomy  2010  . Nose surgery    . Breast reduction surgery    . Cyst removed from vagina    . Induced abortion  1990-1994    x 4  . Tubal ligation    . Abdominal hysterectomy     Family History  Problem Relation Age of Onset  . Hypertension Mother   . Cancer Mother [phone]    Esophageal  . Hypertension Maternal Grandmother    History  Substance Use Topics  . Smoking status: Never Smoker   . Smokeless tobacco: Never Used  . Alcohol Use: 0.0 oz/week    0 Not specified per week     Comment: 2 wine or mixed drinks on a weekend   OB History    No data available     Review of Systems  Constitutional: Negative.   Skin: Positive for rash.       "One spot"  All other systems reviewed and are negative.   Allergies  Ciprofloxacin  Home Medications   Prior to Admission medications   Medication Sig Start Date End Date Taking? Authorizing Provider  Alogliptin-Pioglitazone 25-30 MG TABS Take 1 tablet by mouth daily. 07/26/13   Aleksei Plotnikov V, MD  atorvastatin (LIPITOR) 10 MG tablet Take 1 tablet (10 mg total) by mouth daily. 03/07/14   Romero BellingSean Ellison, MD  bromocriptine (PARLODEL) 2.5 MG tablet Take  0.5 tablets (1.25 mg total) by mouth daily. 07/04/14   Romero BellingSean Ellison, MD  budesonide (RHINOCORT AQUA) 32 MCG/ACT nasal spray Place 1 spray into the nose 2 (two) times daily. One spray each nostril bid 04/24/13   Linna HoffJames D Kindl, MD  butalbital-acetaminophen-caffeine (FIORICET) 925730388350-325-40 MG per tablet Take 1-2 tablets by mouth 2 (two) times daily as needed for headache. 02/14/13   Aleksei Plotnikov V, MD  Canagliflozin (INVOKANA) 300 MG TABS Take 1 tablet (300 mg total) by mouth daily. 03/07/14   Romero BellingSean Ellison, MD  Cholecalciferol 1000 UNITS tablet Take 1,000 Units by mouth daily.      Historical Provider, MD  dexlansoprazole (DEXILANT) 60 MG capsule Take 1 capsule (60 mg total) by mouth every morning. For indigestion 01/30/13   Aleksei Plotnikov V, MD  fexofenadine (ALLEGRA) 180 MG tablet Take 1 tablet (180 mg total) by mouth daily. 04/24/13   Linna HoffJames D Kindl, MD  gabapentin (NEURONTIN) 100 MG capsule Take 1 capsule (100 mg total) by mouth 3 (three) times daily. 10/13/12   Aleksei Plotnikov V, MD  glucose blood (FREESTYLE TEST STRIPS) test strip Use two times daily as instructed. Dx: 250.00 03/30/13   Aleksei Plotnikov V, MD  hydrocortisone (ANUSOL-HC) 2.5 % rectal cream Place 1 application rectally 2 (two)  times daily. 10/22/14   Joaquim NamGraham S Duncan, MD  ketoconazole (NIZORAL) 2 % shampoo Apply 1 application topically 2 (two) times a week. 02/12/14   Aleksei Plotnikov V, MD  Lancets (FREESTYLE) lancets Use two times daily as instructed. Dx: 250.00 03/30/13   Tresa GarterAleksei Plotnikov V, MD  metFORMIN (GLUCOPHAGE) 500 MG tablet Take 2 tablets (1,000 mg total) by mouth 2 (two) times daily with a meal. 02/14/13   Aleksei Plotnikov V, MD  Olmesartan-Amlodipine-HCTZ (TRIBENZOR) 40-5-25 MG TABS 1 tablet by Per post-pyloric tube route daily. 02/12/14   Aleksei Plotnikov V, MD  penciclovir (DENAVIR) 1 % cream Apply 1 application topically every 2 (two) hours. 06/16/14   Hayden Rasmussenavid Surya Schroeter, NP  promethazine-codeine (PHENERGAN WITH CODEINE) 6.25-10  MG/5ML syrup Take 5 mLs by mouth every 4 (four) hours as needed for cough. 05/11/14   Aleksei Plotnikov V, MD  sodium chloride (OCEAN NASAL SPRAY) 0.65 % nasal spray Place 1 spray into the nose as needed for congestion. 08/16/12 08/16/13  Johnsie Kindredarmen L Chatten, NP  triamcinolone ointment (KENALOG) 0.5 % Apply 1 application topically 4 (four) times daily. To lips 06/18/14   Aleksei Plotnikov V, MD  valACYclovir (VALTREX) 1000 MG tablet Take 1 tablet (1,000 mg total) by mouth 3 (three) times daily. 11/12/14 11/26/14  Hayden Rasmussenavid Chon Buhl, NP   BP 157/97 mmHg  Pulse 80  Temp(Src) 98 F (36.7 C) (Oral)  Resp 16  SpO2 97% Physical Exam  Constitutional: She is oriented to person, place, and time. She appears well-developed and well-nourished. No distress.  HENT:  Mouth/Throat: Oropharynx is clear and moist.  Pulmonary/Chest: Effort normal. No respiratory distress.  Neurological: She is alert and oriented to person, place, and time.  Skin: Skin is warm and dry.  There is a solitary roughly annular lesion approx 7 m diameter with underlying hyperemia and overlying papulovesicular lesions. Under magnification there are are 4-5 smaller vesicles. No surrounding erythema, lymphangitis, drainage, bleeding or other lesions.  Mildly tender.  Psychiatric: She has a normal mood and affect.  Nursing note and vitals reviewed.   ED Course  Procedures (including critical care time) Labs Review Labs Reviewed - No data to display  Imaging Review No results found.   MDM   1. Papulovesicular rash   2. Zoster     Possible a zoster type lesion. May also consider an insect bite Tx with valtrex course.    Hayden Rasmussenavid Jackquelyn Sundberg, NP 11/12/14 0930

## 2014-11-16 ENCOUNTER — Ambulatory Visit: Payer: 59 | Admitting: Family

## 2014-12-20 ENCOUNTER — Observation Stay (HOSPITAL_COMMUNITY)
Admission: EM | Admit: 2014-12-20 | Discharge: 2014-12-21 | Disposition: A | Discharge status: Home / Self Care | Payer: 59 | Attending: Internal Medicine | Admitting: Internal Medicine

## 2014-12-20 ENCOUNTER — Emergency Department (HOSPITAL_COMMUNITY)
Admission: EM | Admit: 2014-12-20 | Discharge: 2014-12-20 | Disposition: A | Discharge status: Home / Self Care | Payer: 59 | Source: Home / Self Care | Attending: Family Medicine | Admitting: Family Medicine

## 2014-12-20 ENCOUNTER — Encounter (HOSPITAL_COMMUNITY): Payer: Self-pay

## 2014-12-20 ENCOUNTER — Encounter (HOSPITAL_COMMUNITY): Payer: Self-pay | Admitting: *Deleted

## 2014-12-20 ENCOUNTER — Emergency Department (HOSPITAL_COMMUNITY): Payer: 59

## 2014-12-20 DIAGNOSIS — K644 Residual hemorrhoidal skin tags: Secondary | ICD-10-CM

## 2014-12-20 DIAGNOSIS — Z79899 Other long term (current) drug therapy: Secondary | ICD-10-CM | POA: Diagnosis not present

## 2014-12-20 DIAGNOSIS — R079 Chest pain, unspecified: Secondary | ICD-10-CM

## 2014-12-20 DIAGNOSIS — Z7721 Contact with and (suspected) exposure to potentially hazardous body fluids: Secondary | ICD-10-CM

## 2014-12-20 DIAGNOSIS — D509 Iron deficiency anemia, unspecified: Secondary | ICD-10-CM | POA: Insufficient documentation

## 2014-12-20 DIAGNOSIS — E119 Type 2 diabetes mellitus without complications: Secondary | ICD-10-CM

## 2014-12-20 DIAGNOSIS — B029 Zoster without complications: Secondary | ICD-10-CM

## 2014-12-20 DIAGNOSIS — J069 Acute upper respiratory infection, unspecified: Secondary | ICD-10-CM

## 2014-12-20 DIAGNOSIS — D72829 Elevated white blood cell count, unspecified: Secondary | ICD-10-CM

## 2014-12-20 DIAGNOSIS — E669 Obesity, unspecified: Secondary | ICD-10-CM

## 2014-12-20 DIAGNOSIS — K219 Gastro-esophageal reflux disease without esophagitis: Secondary | ICD-10-CM | POA: Diagnosis not present

## 2014-12-20 DIAGNOSIS — Z7952 Long term (current) use of systemic steroids: Secondary | ICD-10-CM | POA: Insufficient documentation

## 2014-12-20 DIAGNOSIS — G4700 Insomnia, unspecified: Secondary | ICD-10-CM

## 2014-12-20 DIAGNOSIS — E1165 Type 2 diabetes mellitus with hyperglycemia: Secondary | ICD-10-CM

## 2014-12-20 DIAGNOSIS — I10 Essential (primary) hypertension: Secondary | ICD-10-CM

## 2014-12-20 DIAGNOSIS — Z0000 Encounter for general adult medical examination without abnormal findings: Secondary | ICD-10-CM

## 2014-12-20 DIAGNOSIS — R600 Localized edema: Secondary | ICD-10-CM

## 2014-12-20 DIAGNOSIS — R590 Localized enlarged lymph nodes: Secondary | ICD-10-CM

## 2014-12-20 DIAGNOSIS — R202 Paresthesia of skin: Secondary | ICD-10-CM

## 2014-12-20 DIAGNOSIS — E049 Nontoxic goiter, unspecified: Secondary | ICD-10-CM

## 2014-12-20 LAB — BASIC METABOLIC PANEL
ANION GAP: 15 (ref 5–15)
BUN: 12 mg/dL (ref 6–23)
CHLORIDE: 101 meq/L (ref 96–112)
CO2: 20 mmol/L (ref 19–32)
Calcium: 9.3 mg/dL (ref 8.4–10.5)
Creatinine, Ser: 0.71 mg/dL (ref 0.50–1.10)
GFR calc non Af Amer: >90 mL/min (ref >90)
Glucose, Bld: 106 mg/dL — ABNORMAL HIGH (ref 70–99)
POTASSIUM: 3.7 mmol/L (ref 3.5–5.1)
SODIUM: 136 mmol/L (ref 135–145)

## 2014-12-20 LAB — I-STAT TROPONIN, ED: TROPONIN I, POC: 0 ng/mL (ref 0.00–0.08)

## 2014-12-20 LAB — CBC
HCT: 41.1 % (ref 36.0–46.0)
Hemoglobin: 13.9 g/dL (ref 12.0–15.0)
MCH: 28.1 pg (ref 26.0–34.0)
MCHC: 33.8 g/dL (ref 30.0–36.0)
MCV: 83 fL (ref 78.0–100.0)
Platelets: 283 10*3/uL (ref 150–400)
RBC: 4.95 MIL/uL (ref 3.87–5.11)
RDW: 14 % (ref 11.5–15.5)
WBC: 12.1 10*3/uL — ABNORMAL HIGH (ref 4.0–10.5)

## 2014-12-20 LAB — PREGNANCY, URINE: PREG TEST UR: NEGATIVE

## 2014-12-20 MED ORDER — NITROGLYCERIN 0.4 MG SL SUBL: 0.4000 mg | SUBLINGUAL_TABLET | SUBLINGUAL | Status: DC | PRN

## 2014-12-20 MED ORDER — NITROGLYCERIN 0.4 MG SL SUBL
SUBLINGUAL_TABLET | SUBLINGUAL | Status: AC
  Filled 2014-12-20: qty 1

## 2014-12-20 MED ORDER — NITROGLYCERIN 0.4 MG SL SUBL
0.4000 mg | SUBLINGUAL_TABLET | SUBLINGUAL | Status: DC | PRN
  Administered 2014-12-20: 0.4 mg via SUBLINGUAL

## 2014-12-20 MED ORDER — ASPIRIN 325 MG PO TABS
325.0000 mg | ORAL_TABLET | ORAL | Status: AC
  Administered 2014-12-20: 325 mg via ORAL
  Filled 2014-12-20: qty 1

## 2014-12-20 MED ORDER — SODIUM CHLORIDE 0.9 % IV SOLN: Freq: Once | INTRAVENOUS | Status: DC

## 2014-12-20 NOTE — ED Provider Notes (Signed)
CSN: [id number]     Arrival date & time 12/20/14  1912 History   First MD Initiated Contact with Patient 12/20/14 2026     Chief Complaint  Patient presents with  . Arm Pain   (Consider location/radiation/quality/duration/timing/severity/associated sxs/prior Treatment) HPI Comments: 44 year old female with a history of morbid obesity, hypertension and type 2 diabetes mellitus developed squeezing left arm pain yesterday. It is been continuous and bothered her last night while trying to sleep. During that time she had an episode of diaphoresis. While she was waiting to be seen in the lobby of the urgent care she developed a tightness and pressure feeling in the anterior chest. She has had no nausea or vomiting. It is noted that her current blood pressure is 196/120.  Patient is a 44 y.o. female presenting with arm pain.  Arm Pain Associated symptoms include chest pain. Pertinent negatives include no shortness of breath.    Past Medical History  Diagnosis Date  . Chronic headache     Dr Vela ProseLewitt, Neg MRI 2005  . Anemia, iron deficiency   . HTN (hypertension)   . GERD (gastroesophageal reflux disease)   . Elevated glucose 2011  . Diabetes mellitus    Past Surgical History  Procedure Laterality Date  . Partial hysterectomy  2010  . Nose surgery    . Breast reduction surgery    . Cyst removed from vagina    . Induced abortion  1990-1994    x 4  . Tubal ligation    . Abdominal hysterectomy     Family History  Problem Relation Age of Onset  . Hypertension Mother   . Cancer Mother [id number]    Esophageal  . Hypertension Maternal Grandmother    History  Substance Use Topics  . Smoking status: Never Smoker   . Smokeless tobacco: Never Used  . Alcohol Use: 0.0 oz/week    0 Not specified per week     Comment: 2 wine or mixed drinks on a weekend   OB History    No data available     Review of Systems  Constitutional: Positive for activity change and fatigue. Negative for fever.   HENT: Negative.   Respiratory: Positive for chest tightness. Negative for cough, shortness of breath and wheezing.   Cardiovascular: Positive for chest pain. Negative for palpitations and leg swelling.  Gastrointestinal: Negative.   Genitourinary: Negative.   Neurological: Negative.     Allergies  Ciprofloxacin  Home Medications   Prior to Admission medications   Medication Sig Start Date End Date Taking? Authorizing Provider  Alogliptin-Pioglitazone 25-30 MG TABS Take 1 tablet by mouth daily. 07/26/13   Aleksei Plotnikov V, MD  atorvastatin (LIPITOR) 10 MG tablet Take 1 tablet (10 mg total) by mouth daily. 03/07/14   Romero BellingSean Ellison, MD  bromocriptine (PARLODEL) 2.5 MG tablet Take 0.5 tablets (1.25 mg total) by mouth daily. 07/04/14   Romero BellingSean Ellison, MD  budesonide (RHINOCORT AQUA) 32 MCG/ACT nasal spray Place 1 spray into the nose 2 (two) times daily. One spray each nostril bid 04/24/13   Linna HoffJames D Kindl, MD  butalbital-acetaminophen-caffeine (FIORICET) [phone] MG per tablet Take 1-2 tablets by mouth 2 (two) times daily as needed for headache. 02/14/13   Aleksei Plotnikov V, MD  Canagliflozin (INVOKANA) 300 MG TABS Take 1 tablet (300 mg total) by mouth daily. 03/07/14   Romero BellingSean Ellison, MD  Cholecalciferol 1000 UNITS tablet Take 1,000 Units by mouth daily.      Historical Provider, MD  dexlansoprazole Raelyn Mora(DEXILANT)  60 MG capsule Take 1 capsule (60 mg total) by mouth every morning. For indigestion 01/30/13   Aleksei Plotnikov V, MD  fexofenadine (ALLEGRA) 180 MG tablet Take 1 tablet (180 mg total) by mouth daily. 04/24/13   Linna Hoff, MD  gabapentin (NEURONTIN) 100 MG capsule Take 1 capsule (100 mg total) by mouth 3 (three) times daily. 10/13/12   Aleksei Plotnikov V, MD  glucose blood (FREESTYLE TEST STRIPS) test strip Use two times daily as instructed. Dx: 250.00 03/30/13   Aleksei Plotnikov V, MD  hydrocortisone (ANUSOL-HC) 2.5 % rectal cream Place 1 application rectally 2 (two) times daily. 10/22/14    Joaquim Nam, MD  ketoconazole (NIZORAL) 2 % shampoo Apply 1 application topically 2 (two) times a week. 02/12/14   Aleksei Plotnikov V, MD  Lancets (FREESTYLE) lancets Use two times daily as instructed. Dx: 250.00 03/30/13   Tresa Garter, MD  metFORMIN (GLUCOPHAGE) 500 MG tablet Take 2 tablets (1,000 mg total) by mouth 2 (two) times daily with a meal. 02/14/13   Aleksei Plotnikov V, MD  Olmesartan-Amlodipine-HCTZ (TRIBENZOR) 40-5-25 MG TABS 1 tablet by Per post-pyloric tube route daily. 02/12/14   Aleksei Plotnikov V, MD  penciclovir (DENAVIR) 1 % cream Apply 1 application topically every 2 (two) hours. 06/16/14   Hayden Rasmussen, NP  promethazine-codeine (PHENERGAN WITH CODEINE) 6.25-10 MG/5ML syrup Take 5 mLs by mouth every 4 (four) hours as needed for cough. 05/11/14   Aleksei Plotnikov V, MD  sodium chloride (OCEAN NASAL SPRAY) 0.65 % nasal spray Place 1 spray into the nose as needed for congestion. 08/16/12 08/16/13  Johnsie Kindred, NP  triamcinolone ointment (KENALOG) 0.5 % Apply 1 application topically 4 (four) times daily. To lips 06/18/14   Aleksei Plotnikov V, MD   BP 196/120 mmHg  Pulse 86  Temp(Src) 98 F (36.7 C) (Oral)  Resp 15  SpO2 95% Physical Exam  Constitutional: She appears well-developed and well-nourished. No distress.  Eyes: Conjunctivae and EOM are normal.  Neck: Normal range of motion. Neck supple.  Cardiovascular: Normal rate, regular rhythm, normal heart sounds and intact distal pulses.   Pulmonary/Chest: Effort normal and breath sounds normal. No respiratory distress. She has no wheezes. She has no rales.  Musculoskeletal: She exhibits no edema.  Lymphadenopathy:    She has no cervical adenopathy.  Neurological: She is alert. She exhibits normal muscle tone.  Skin: Skin is warm and dry.  Psychiatric: She has a normal mood and affect.  Nursing note and vitals reviewed.   ED Course  Procedures (including critical care time) Labs Review Labs Reviewed - No data to  display  Imaging Review No results found.   MDM   1. Chest pain, unspecified chest pain type   2. Essential hypertension   3. Type 2 diabetes mellitus without complication   4. Obesity    Transfer to [facility] via EMS for Chest pain and left arm pain with multiple risk factors.  Iv, oxygen, NTG  .  sl, monitor.     Hayden Rasmussen, NP 12/20/14 2043

## 2014-12-20 NOTE — ED Notes (Signed)
Patient complains of having pain to her left arm that started last night Patient states it feels like someone is cutting off  Her circulation Patient also complains of chest pain that started here in the lobby-5 on a pain scale Denies any cough or congestion

## 2014-12-20 NOTE — ED Provider Notes (Signed)
CSN: [id number]     Arrival date & time 12/20/14  2113 History   First MD Initiated Contact with Patient 12/20/14 2138     Chief Complaint  Patient presents with  . Chest Pain     (Consider location/radiation/quality/duration/timing/severity/associated sxs/prior Treatment) Patient is a 44 y.o. female presenting with chest pain.  Chest Pain Pain location:  Substernal area Pain quality: pressure   Pain radiates to:  L arm Pain radiates to the back: no   Pain severity:  Moderate Onset quality:  Gradual Duration:  3 hours Timing:  Constant ("like a pulse") Progression:  Improving Relieved by:  Nitroglycerin Worsened by:  Exertion Ineffective treatments:  Certain positions Associated symptoms: no abdominal pain, no back pain, no cough, no diaphoresis, no fatigue, no fever, no headache, no nausea, no numbness, no shortness of breath (feel pulling with deep breath) and not vomiting   Risk factors: diabetes mellitus and hypertension   Risk factors: no coronary artery disease, no high cholesterol, no immobilization, not female, no prior DVT/PE, no smoking and no surgery     Past Medical History  Diagnosis Date  . Chronic headache     Dr Vela Prose, Neg MRI 2005  . Anemia, iron deficiency   . HTN (hypertension)   . GERD (gastroesophageal reflux disease)   . Elevated glucose 2011  . Diabetes mellitus    Past Surgical History  Procedure Laterality Date  . Partial hysterectomy  2010  . Nose surgery    . Breast reduction surgery    . Cyst removed from vagina    . Induced abortion  1990-1994    x 4  . Tubal ligation    . Abdominal hysterectomy     Family History  Problem Relation Age of Onset  . Hypertension Mother   . Cancer Mother 38    Esophageal  . Hypertension Maternal Grandmother    History  Substance Use Topics  . Smoking status: Never Smoker   . Smokeless tobacco: Never Used  . Alcohol Use: 0.0 oz/week    0 Not specified per week     Comment: 2 wine or mixed drinks on  a weekend   OB History    No data available     Review of Systems  Constitutional: Negative for fever, diaphoresis and fatigue.  HENT: Negative for sore throat.   Eyes: Negative for visual disturbance.  Respiratory: Negative for cough and shortness of breath (feel pulling with deep breath).   Cardiovascular: Positive for chest pain. Negative for leg swelling.  Gastrointestinal: Negative for nausea, vomiting and abdominal pain.  Genitourinary: Negative for difficulty urinating.  Musculoskeletal: Negative for back pain and neck pain.  Skin: Negative for rash.  Neurological: Negative for syncope, numbness and headaches.      Allergies  Ciprofloxacin  Home Medications   Prior to Admission medications   Medication Sig Start Date End Date Taking? Authorizing Provider  butalbital-acetaminophen-caffeine (FIORICET) 50-325-40 MG per tablet Take 1-2 tablets by mouth 2 (two) times daily as needed for headache. 02/14/13  Yes Aleksei Plotnikov V, MD  Canagliflozin (INVOKANA) 300 MG TABS Take 1 tablet (300 mg total) by mouth daily. 03/07/14  Yes Romero Belling, MD  dexlansoprazole (DEXILANT) 60 MG capsule Take 1 capsule (60 mg total) by mouth every morning. For indigestion Patient taking differently: Take 60 mg by mouth daily as needed (acid reflux). For indigestion 01/30/13  Yes Aleksei Plotnikov V, MD  fexofenadine (ALLEGRA) 180 MG tablet Take 1 tablet (180 mg total) by mouth  daily. Patient taking differently: Take 180 mg by mouth daily as needed for allergies.  04/24/13  Yes Linna Hoff, MD  gabapentin (NEURONTIN) 100 MG capsule Take 1 capsule (100 mg total) by mouth 3 (three) times daily. Patient taking differently: Take 100 mg by mouth 3 (three) times daily as needed (shingles).  10/13/12  Yes Aleksei Plotnikov V, MD  glucose blood (FREESTYLE TEST STRIPS) test strip Use two times daily as instructed. Dx: 250.00 03/30/13  Yes Aleksei Plotnikov V, MD  ketoconazole (NIZORAL) 2 % shampoo Apply 1  application topically 2 (two) times a week. Patient taking differently: Apply 1 application topically daily as needed for irritation.  02/12/14  Yes Aleksei Plotnikov V, MD  Lancets (FREESTYLE) lancets Use two times daily as instructed. Dx: 250.00 03/30/13  Yes Aleksei Plotnikov V, MD  metFORMIN (GLUCOPHAGE) 500 MG tablet Take 2 tablets (1,000 mg total) by mouth 2 (two) times daily with a meal. 02/14/13  Yes Aleksei Plotnikov V, MD  Multiple Vitamins-Calcium (ONE-A-DAY WOMENS PO) Take 1 tablet by mouth daily.   Yes Historical Provider, MD  Olmesartan-Amlodipine-HCTZ (TRIBENZOR) 40-5-25 MG TABS 1 tablet by Per post-pyloric tube route daily. 02/12/14  Yes Aleksei Plotnikov V, MD  promethazine-codeine (PHENERGAN WITH CODEINE) 6.25-10 MG/5ML syrup Take 5 mLs by mouth every 4 (four) hours as needed for cough. 05/11/14  Yes Aleksei Plotnikov V, MD  sodium chloride (OCEAN NASAL SPRAY) 0.65 % nasal spray Place 1 spray into the nose as needed for congestion. 08/16/12 12/20/14 Yes Johnsie Kindred, NP  Tetrahydrozoline-Zn Sulfate (EYE DROPS ALLERGY RELIEF OP) Place 1 drop into both eyes daily as needed (allergies).   Yes Historical Provider, MD  Alogliptin-Pioglitazone 25-30 MG TABS Take 1 tablet by mouth daily. Patient not taking: Reported on 12/20/2014 07/26/13   [name] V, MD  atorvastatin (LIPITOR) 10 MG tablet Take 1 tablet (10 mg total) by mouth daily. Patient not taking: Reported on 12/20/2014 03/07/14   Romero Belling, MD  bromocriptine (PARLODEL) 2.5 MG tablet Take 0.5 tablets (1.25 mg total) by mouth daily. Patient not taking: Reported on 12/20/2014 07/04/14   Romero Belling, MD  budesonide (RHINOCORT AQUA) 32 MCG/ACT nasal spray Place 1 spray into the nose 2 (two) times daily. One spray each nostril bid Patient not taking: Reported on 12/20/2014 04/24/13   Linna Hoff, MD  Cholecalciferol 1000 UNITS tablet Take 1,000 Units by mouth daily.      Historical Provider, MD  hydrocortisone (ANUSOL-HC) 2.5 % rectal cream  Place 1 application rectally 2 (two) times daily. Patient not taking: Reported on 12/20/2014 10/22/14   Joaquim Nam, MD  penciclovir [facility]) 1 % cream Apply 1 application topically every 2 (two) hours. Patient not taking: Reported on 12/20/2014 06/16/14   Hayden Rasmussen, NP  triamcinolone ointment (KENALOG) 0.5 % Apply 1 application topically 4 (four) times daily. To lips Patient not taking: Reported on 12/20/2014 06/18/14   Aleksei Plotnikov V, MD   BP 150/103 mmHg  Pulse 86  Temp(Src) 98.2 F (36.8 C) (Oral)  Resp 20  Ht  (1.626 m)  Wt 226 lb (102.513 kg)  BMI 38.77 kg/m2  SpO2 98% Physical Exam  Constitutional: She is oriented to person, place, and time. She appears well-developed and well-nourished. No distress.  HENT:  Head: Normocephalic and atraumatic.  Eyes: Conjunctivae and EOM are normal.  Neck: Normal range of motion.  Cardiovascular: Normal rate, regular rhythm, normal heart sounds and intact distal pulses.  Exam reveals no gallop and no  friction rub.   No murmur heard. Pulmonary/Chest: Effort normal and breath sounds normal. No respiratory distress. She has no wheezes. She has no rales.  Abdominal: Soft. She exhibits no distension. There is no tenderness. There is no guarding.  Musculoskeletal: She exhibits no edema or tenderness.  Neurological: She is alert and oriented to person, place, and time.  Skin: Skin is warm and dry. No rash noted. She is not diaphoretic. No erythema.  Nursing note and vitals reviewed.   ED Course  Procedures (including critical care time) Labs Review Labs Reviewed  CBC - Abnormal; Notable for the following:    WBC 12.1 (*)    All other components within normal limits  BASIC METABOLIC PANEL - Abnormal; Notable for the following:    Glucose, Bld 106 (*)    All other components within normal limits  PREGNANCY, URINE  TROPONIN I  Rosezena SensorI-STAT TROPOININ, ED    Imaging Review Dg Chest Port 1 View  12/20/2014   CLINICAL DATA:  Chest pain and  left arm pain for 1 day.  EXAM: PORTABLE CHEST - 1 VIEW  COMPARISON:  04/20/2011  FINDINGS: Shallow inspiration. Normal heart size and pulmonary vascularity. No focal airspace disease or consolidation in the lungs. No blunting of costophrenic angles. No pneumothorax. Mediastinal contours appear intact.  IMPRESSION: No active disease.   Electronically Signed   By: Burman NievesWilliam  Stevens M.D.   On: 12/20/2014 21:55     EKG Interpretation   Date/Time:  Thursday December 20 2014 21:18:03 EST Ventricular Rate:  90 PR Interval:  160 QRS Duration: 84 QT Interval:  358 QTC Calculation: 438 R Axis:   61 Text Interpretation:  Sinus rhythm Confirmed by Lincoln Brighamees, Liz [phone]([id number]) on  12/20/2014 10:41:50 PM      MDM   Final diagnoses:  None   44 year old female with a history of hypertension, diabetes presents with concern for left arm pain and chest pain. Differential diagnosis for chest pain includes ACS, angina, PE, aortic dissection, pericarditis. EKG was evaluated by me and shows a normal sinus rhythm without any acute ST changes to indicate acute ischemia or pericarditis. Chest x-ray shows no sign of pneumonia, pneumothorax, CHF.  Initial troponin, BMP were within normal limits.  Pt does not have history or risk factors concerning for PE. Patient with mild leukocytosis 12.1.  Her she was given 324 of aspirin and nitroglycerin with relief of her pain. Given typical features of her chest pain, exertional nature and risk factors will admit for observation for chest pain. Patient was admitted in stable condition.  Rhae LernerErin Elizabeth Susumu Hackler, MD 12/21/14 0151  Tilden FossaElizabeth Rees, MD 12/24/14 [phone]

## 2014-12-20 NOTE — ED Notes (Signed)
Pt arrives from urgent care via [facility]. Pt c/o chest pain that started around 2045 in central chest describing it as pressure feeling with some left shoulder throbbing that radiates down the left arm. Denies nausea and vomiting, dizziness and lightheadedness.

## 2014-12-21 ENCOUNTER — Observation Stay (HOSPITAL_COMMUNITY): Payer: 59

## 2014-12-21 ENCOUNTER — Encounter (HOSPITAL_COMMUNITY): Payer: Self-pay | Admitting: Internal Medicine

## 2014-12-21 DIAGNOSIS — R079 Chest pain, unspecified: Secondary | ICD-10-CM

## 2014-12-21 DIAGNOSIS — I10 Essential (primary) hypertension: Secondary | ICD-10-CM

## 2014-12-21 DIAGNOSIS — E119 Type 2 diabetes mellitus without complications: Secondary | ICD-10-CM

## 2014-12-21 DIAGNOSIS — I517 Cardiomegaly: Secondary | ICD-10-CM

## 2014-12-21 LAB — COMPREHENSIVE METABOLIC PANEL
ALT: 19 U/L (ref 0–35)
ANION GAP: 14 (ref 5–15)
AST: 18 U/L (ref 0–37)
Albumin: 3.9 g/dL (ref 3.5–5.2)
Alkaline Phosphatase: 61 U/L (ref 39–117)
BUN: 10 mg/dL (ref 6–23)
CHLORIDE: 94 meq/L — AB (ref 96–112)
CO2: 25 mmol/L (ref 19–32)
Calcium: 9.1 mg/dL (ref 8.4–10.5)
Creatinine, Ser: 0.82 mg/dL (ref 0.50–1.10)
GFR calc non Af Amer: 86 mL/min — ABNORMAL LOW (ref >90)
GLUCOSE: 170 mg/dL — AB (ref 70–99)
Potassium: 3.6 mmol/L (ref 3.5–5.1)
Sodium: 133 mmol/L — ABNORMAL LOW (ref 135–145)
Total Bilirubin: 0.9 mg/dL (ref 0.3–1.2)
Total Protein: 7.1 g/dL (ref 6.0–8.3)

## 2014-12-21 LAB — GLUCOSE, CAPILLARY
GLUCOSE-CAPILLARY: 175 mg/dL — AB (ref 70–99)
GLUCOSE-CAPILLARY: 207 mg/dL — AB (ref 70–99)
Glucose-Capillary: 163 mg/dL — ABNORMAL HIGH (ref 70–99)

## 2014-12-21 LAB — CBC WITH DIFFERENTIAL/PLATELET
Basophils Absolute: 0 10*3/uL (ref 0.0–0.1)
Basophils Relative: 0 % (ref 0–1)
Eosinophils Absolute: 0.1 10*3/uL (ref 0.0–0.7)
Eosinophils Relative: 1 % (ref 0–5)
HCT: 39.1 % (ref 36.0–46.0)
Hemoglobin: 13.5 g/dL (ref 12.0–15.0)
LYMPHS PCT: 18 % (ref 12–46)
Lymphs Abs: 2 10*3/uL (ref 0.7–4.0)
MCH: 28.7 pg (ref 26.0–34.0)
MCHC: 34.5 g/dL (ref 30.0–36.0)
MCV: 83 fL (ref 78.0–100.0)
Monocytes Absolute: 0.7 10*3/uL (ref 0.1–1.0)
Monocytes Relative: 6 % (ref 3–12)
NEUTROS ABS: 8.2 10*3/uL — AB (ref 1.7–7.7)
Neutrophils Relative %: 75 % (ref 43–77)
PLATELETS: 267 10*3/uL (ref 150–400)
RBC: 4.71 MIL/uL (ref 3.87–5.11)
RDW: 13.9 % (ref 11.5–15.5)
WBC: 11 10*3/uL — ABNORMAL HIGH (ref 4.0–10.5)

## 2014-12-21 LAB — TROPONIN I
Troponin I: <0.03 ng/mL (ref <0.031)
Troponin I: 0.03 ng/mL (ref <0.031)
Troponin I: <0.03 ng/mL (ref <0.031)

## 2014-12-21 MED ORDER — ONDANSETRON HCL 4 MG/2ML IJ SOLN
4.0000 mg | Freq: Four times a day (QID) | INTRAMUSCULAR | Status: DC | PRN
  Administered 2014-12-21: 4 mg via INTRAVENOUS
  Filled 2014-12-21: qty 2

## 2014-12-21 MED ORDER — REGADENOSON 0.4 MG/5ML IV SOLN
0.4000 mg | Freq: Once | INTRAVENOUS | Status: AC
  Administered 2014-12-21: 0.4 mg via INTRAVENOUS
  Filled 2014-12-21: qty 5

## 2014-12-21 MED ORDER — ONDANSETRON HCL 4 MG/2ML IJ SOLN
4.0000 mg | Freq: Once | INTRAMUSCULAR | Status: AC
  Administered 2014-12-21: 4 mg via INTRAVENOUS

## 2014-12-21 MED ORDER — ONDANSETRON HCL 4 MG/2ML IJ SOLN
INTRAMUSCULAR | Status: AC
  Filled 2014-12-21: qty 2

## 2014-12-21 MED ORDER — NITROGLYCERIN 0.4 MG/HR TD PT24
0.4000 mg | MEDICATED_PATCH | Freq: Every day | TRANSDERMAL | Status: DC
  Administered 2014-12-21: 0.4 mg via TRANSDERMAL
  Filled 2014-12-21: qty 1

## 2014-12-21 MED ORDER — TECHNETIUM TC 99M SESTAMIBI GENERIC - CARDIOLITE
30.0000 | Freq: Once | INTRAVENOUS | Status: AC | PRN
  Administered 2014-12-21: 30 via INTRAVENOUS

## 2014-12-21 MED ORDER — REGADENOSON 0.4 MG/5ML IV SOLN
INTRAVENOUS | Status: AC
  Filled 2014-12-21: qty 5

## 2014-12-21 MED ORDER — OLMESARTAN-AMLODIPINE-HCTZ 40-5-25 MG PO TABS: 1.0000 | ORAL_TABLET | Freq: Every day | ORAL | Status: DC

## 2014-12-21 MED ORDER — HYDROCHLOROTHIAZIDE 25 MG PO TABS
25.0000 mg | ORAL_TABLET | Freq: Every day | ORAL | Status: DC
  Administered 2014-12-21: 25 mg via ORAL
  Filled 2014-12-21: qty 1

## 2014-12-21 MED ORDER — NITROGLYCERIN 0.4 MG SL SUBL: 0.4000 mg | SUBLINGUAL_TABLET | SUBLINGUAL | Status: DC | PRN

## 2014-12-21 MED ORDER — CANAGLIFLOZIN 300 MG PO TABS
300.0000 mg | ORAL_TABLET | Freq: Every day | ORAL | Status: DC
  Administered 2014-12-21: 300 mg via ORAL
  Filled 2014-12-21: qty 300

## 2014-12-21 MED ORDER — GABAPENTIN 100 MG PO CAPS: 100.0000 mg | ORAL_CAPSULE | Freq: Three times a day (TID) | ORAL | Status: DC | PRN

## 2014-12-21 MED ORDER — LORATADINE 10 MG PO TABS
10.0000 mg | ORAL_TABLET | Freq: Every day | ORAL | Status: DC
  Administered 2014-12-21: 10 mg via ORAL
  Filled 2014-12-21: qty 1

## 2014-12-21 MED ORDER — MORPHINE SULFATE 2 MG/ML IJ SOLN
2.0000 mg | INTRAMUSCULAR | Status: DC | PRN
  Administered 2014-12-21: 2 mg via INTRAVENOUS
  Filled 2014-12-21: qty 1

## 2014-12-21 MED ORDER — HYDRALAZINE HCL 20 MG/ML IJ SOLN: 10.0000 mg | INTRAMUSCULAR | Status: DC | PRN

## 2014-12-21 MED ORDER — TECHNETIUM TC 99M SESTAMIBI GENERIC - CARDIOLITE
10.0000 | Freq: Once | INTRAVENOUS | Status: AC | PRN
  Administered 2014-12-21: 10 via INTRAVENOUS

## 2014-12-21 MED ORDER — PANTOPRAZOLE SODIUM 40 MG PO TBEC
40.0000 mg | DELAYED_RELEASE_TABLET | Freq: Every day | ORAL | Status: DC
  Administered 2014-12-21: 40 mg via ORAL
  Filled 2014-12-21: qty 1

## 2014-12-21 MED ORDER — ACETAMINOPHEN 325 MG PO TABS
650.0000 mg | ORAL_TABLET | ORAL | Status: DC | PRN
  Administered 2014-12-21: 650 mg via ORAL
  Filled 2014-12-21: qty 2

## 2014-12-21 MED ORDER — IRBESARTAN 150 MG PO TABS
300.0000 mg | ORAL_TABLET | Freq: Every day | ORAL | Status: DC
  Administered 2014-12-21: 300 mg via ORAL
  Filled 2014-12-21: qty 2

## 2014-12-21 MED ORDER — INSULIN ASPART 100 UNIT/ML ~~LOC~~ SOLN
0.0000 [IU] | Freq: Three times a day (TID) | SUBCUTANEOUS | Status: DC
  Administered 2014-12-21: 2 [IU] via SUBCUTANEOUS
  Administered 2014-12-21: 3 [IU] via SUBCUTANEOUS

## 2014-12-21 MED ORDER — AMLODIPINE BESYLATE 5 MG PO TABS
5.0000 mg | ORAL_TABLET | Freq: Every day | ORAL | Status: DC
  Administered 2014-12-21: 5 mg via ORAL
  Filled 2014-12-21: qty 1

## 2014-12-21 MED ORDER — ASPIRIN EC 325 MG PO TBEC
325.0000 mg | DELAYED_RELEASE_TABLET | Freq: Every day | ORAL | Status: DC
  Administered 2014-12-21: 325 mg via ORAL
  Filled 2014-12-21: qty 1

## 2014-12-21 MED ORDER — ENOXAPARIN SODIUM 40 MG/0.4ML ~~LOC~~ SOLN
40.0000 mg | SUBCUTANEOUS | Status: DC
  Administered 2014-12-21: 40 mg via SUBCUTANEOUS
  Filled 2014-12-21: qty 0.4

## 2014-12-21 NOTE — Progress Notes (Signed)
Echocardiogram 2D Echocardiogram has been performed.  [name] [name], [name] M 12/21/2014, 2:52 PM

## 2014-12-21 NOTE — Progress Notes (Signed)
Patient admitted after midnight- please see H&P.  For Stress test today. Await final CE. Marlin CanaryJessica Vann DO

## 2014-12-21 NOTE — Progress Notes (Signed)
     The patient was seen in nuclear medicine for a lexiscan myoview. She tolerated the procedure well. No acute ST or TW changes on ECG.    Karam Dunson Stern PA-C  [facility]    

## 2014-12-21 NOTE — Discharge Summary (Signed)
Physician Discharge Summary  [name] RackLashonda N Belles ZOX:[id number] DOB: 01/03/1971 DOA: 12/20/2014  PCP: Sonda PrimesAlex Plotnikov, MD  Admit date: 12/20/2014 Discharge date: 12/21/2014  Time spent: 35 minutes  Recommendations for Outpatient Follow-up:  1. Diabetic control  Discharge Diagnoses:  Principal Problem:   Chest pain Active Problems:   Diabetes type 2, controlled   Hypertension, uncontrolled   Pain in the chest   Discharge Condition: improved  Diet recommendation: carb mod  [facility]   12/20/14 2120 12/21/14 0225  Weight: 102.513 kg (226 lb) 101.47 kg (223 lb 11.2 oz)    History of present illness:  [name] [name] is a 44 y.o. female with history of diabetes mellitus type 2 and hypertension presents to the ER because of chest pain. Patient started experiencing left arm heaviness 2 days ago. Patient visited the urgent care center last evening because of the heaviness of the left arm. Over that patient experienced chest pressure and was transferred to the ER. In the patient's blood pressure is found to be elevated. EKG and cardiac markers were negative. Patient's blood pressure and symptoms improved with sublingual nitroglycerin. Given the risk factors patient has been admitted to rule out ACS. Patient denies any shortness of breath nausea vomiting productive cough fever chills.   Hospital Course:  Chest pain- nuclear stress test low risk  Procedures:    Consultations:  cards  Discharge Exam: Filed Vitals:   12/21/14 1418  BP: 125/81  Pulse: 98  Temp: 98.5 F (36.9 C)  Resp: 18     Discharge Instructions    Current Discharge Medication List    CONTINUE these medications which have NOT CHANGED   Details  butalbital-acetaminophen-caffeine (FIORICET) 50-325-40 MG per tablet Take 1-2 tablets by mouth 2 (two) times daily as needed for headache. Qty: 30 tablet, Refills: 0    Canagliflozin (INVOKANA) 300 MG TABS Take 1 tablet (300 mg total) by mouth daily. Qty: 30  tablet, Refills: 11    dexlansoprazole (DEXILANT) 60 MG capsule Take 1 capsule (60 mg total) by mouth every morning. For indigestion Qty: 30 capsule, Refills: 11    fexofenadine (ALLEGRA) 180 MG tablet Take 1 tablet (180 mg total) by mouth daily. Qty: 30 tablet, Refills: 1    gabapentin (NEURONTIN) 100 MG capsule Take 1 capsule (100 mg total) by mouth 3 (three) times daily. Qty: 90 capsule, Refills: 3    glucose blood (FREESTYLE TEST STRIPS) test strip Use two times daily as instructed. Dx: 250.00 Qty: 100 each, Refills: 6    ketoconazole (NIZORAL) 2 % shampoo Apply 1 application topically 2 (two) times a week. Qty: 120 mL, Refills: 5    Lancets (FREESTYLE) lancets Use two times daily as instructed. Dx: 250.00 Qty: 100 each, Refills: 6    metFORMIN (GLUCOPHAGE) 500 MG tablet Take 2 tablets (1,000 mg total) by mouth 2 (two) times daily with a meal. Qty: 120 tablet, Refills: 11    Multiple Vitamins-Calcium (ONE-A-DAY WOMENS PO) Take 1 tablet by mouth daily.    Olmesartan-Amlodipine-HCTZ (TRIBENZOR) 40-5-25 MG TABS 1 tablet by Per post-pyloric tube route daily. Qty: 30 tablet, Refills: 11    promethazine-codeine (PHENERGAN WITH CODEINE) 6.25-10 MG/5ML syrup Take 5 mLs by mouth every 4 (four) hours as needed for cough. Qty: 300 mL, Refills: 0    sodium chloride (OCEAN NASAL SPRAY) 0.65 % nasal spray Place 1 spray into the nose as needed for congestion. Qty: 30 mL, Refills: 12    Tetrahydrozoline-Zn Sulfate (EYE DROPS ALLERGY RELIEF OP) Place 1  drop into both eyes daily as needed (allergies).    Alogliptin-Pioglitazone 25-30 MG TABS Take 1 tablet by mouth daily. Qty: 30 tablet, Refills: 11    atorvastatin (LIPITOR) 10 MG tablet Take 1 tablet (10 mg total) by mouth daily. Qty: 90 tablet, Refills: 3    bromocriptine (PARLODEL) 2.5 MG tablet Take 0.5 tablets (1.25 mg total) by mouth daily. Qty: 15 tablet, Refills: 11    budesonide (RHINOCORT AQUA) 32 MCG/ACT nasal spray Place 1  spray into the nose 2 (two) times daily. One spray each nostril bid Qty: 1 Bottle, Refills: 1    Cholecalciferol 1000 UNITS tablet Take 1,000 Units by mouth daily.      hydrocortisone (ANUSOL-HC) 2.5 % rectal cream Place 1 application rectally 2 (two) times daily. Qty: 30 g, Refills: 1    penciclovir (DENAVIR) 1 % cream Apply 1 application topically every 2 (two) hours. Qty: 1.5 g, Refills: 0    triamcinolone ointment (KENALOG) 0.5 % Apply 1 application topically 4 (four) times daily. To lips Qty: 15 g, Refills: 1       Allergies  Allergen Reactions  . Ciprofloxacin     REACTION: gittery      The results of significant diagnostics from this hospitalization (including imaging, microbiology, ancillary and laboratory) are listed below for reference.    Significant Diagnostic Studies: Nm Myocar Multi W/spect W/wall Motion / Ef  12/21/2014   CLINICAL DATA:  Chest pain. Diabetes. Hypertension. Left arm heaviness.  EXAM: MYOCARDIAL IMAGING WITH SPECT (REST AND PHARMACOLOGIC-STRESS)  GATED LEFT VENTRICULAR WALL MOTION STUDY  LEFT VENTRICULAR EJECTION FRACTION  TECHNIQUE: Standard myocardial SPECT imaging was performed after resting intravenous injection of 10 mCi Tc-33m sestamibi. Subsequently, intravenous infusion of Lexiscan was performed under the supervision of the Cardiology staff. At peak effect of the drug, 30 mCi Tc-38m sestamibi was injected intravenously and standard myocardial SPECT imaging was performed. Quantitative gated imaging was also performed to evaluate left ventricular wall motion, and estimate left ventricular ejection fraction.  COMPARISON:  Chest x-ray 12/20/2014  FINDINGS: Perfusion: No decreased activity in the left ventricle on stress imaging to suggest reversible ischemia or infarction.  Wall Motion: Normal left ventricular wall motion. No left ventricular dilation.  Left Ventricular Ejection Fraction: 66 %  End diastolic volume 57 ml  End systolic volume 19 ml   IMPRESSION: 1. No reversible ischemia or infarction.  2. Normal left ventricular wall motion.  3. Left ventricular ejection fraction 66%  4. Low-risk stress test findings*.  *2012 Appropriate Use Criteria for Coronary Revascularization Focused Update: J Am Coll Cardiol. 2012;59(9):857-881. http://content.dementiazones.com.aspx?articleid=1201161   Electronically Signed   By: Gennette Pac M.D.   On: 12/21/2014 15:47   Dg Chest Port 1 View  12/20/2014   CLINICAL DATA:  Chest pain and left arm pain for 1 day.  EXAM: PORTABLE CHEST - 1 VIEW  COMPARISON:  04/20/2011  FINDINGS: Shallow inspiration. Normal heart size and pulmonary vascularity. No focal airspace disease or consolidation in the lungs. No blunting of costophrenic angles. No pneumothorax. Mediastinal contours appear intact.  IMPRESSION: No active disease.   Electronically Signed   By: Burman Nieves M.D.   On: 12/20/2014 21:55    Microbiology: No results found for this or any previous visit (from the past 240 hour(s)).   Labs: Basic Metabolic Panel:  Recent Labs Lab 12/20/14 2204 12/21/14 0813  NA 136 133*  K 3.7 3.6  CL 101 94*  CO2 20 25  GLUCOSE 106* 170*  BUN 12  10  CREATININE 0.71 0.82  CALCIUM 9.3 9.1   Liver Function Tests:  Recent Labs Lab 12/21/14 0813  AST 18  ALT 19  ALKPHOS 61  BILITOT 0.9  PROT 7.1  ALBUMIN 3.9   No results for input(s): LIPASE, AMYLASE in the last 168 hours. No results for input(s): AMMONIA in the last 168 hours. CBC:  Recent Labs Lab 12/20/14 2204 12/21/14 0813  WBC 12.1* 11.0*  NEUTROABS  --  8.2*  HGB 13.9 13.5  HCT 41.1 39.1  MCV 83.0 83.0  PLT 283 267   Cardiac Enzymes:  Recent Labs Lab 12/21/14 0043 12/21/14 0314 12/21/14 0813 12/21/14 1400  TROPONINI <0.03 0.03 <0.03 <0.03   BNP: BNP (last 3 results) No results for input(s): PROBNP in the last 8760 hours. CBG:  Recent Labs Lab 12/21/14 0745 12/21/14 1434  GLUCAP 175* 207*        Signed:  Benjamine Mola, Tirsa Gail  [facility] 12/21/2014, 4:11 PM

## 2014-12-21 NOTE — Progress Notes (Signed)
Patient arrived to floor, 3 out of 10 chest pain. Morphine given. Will continue to monitor patient.

## 2014-12-21 NOTE — H&P (Signed)
[facility] History and Physical  [name] RackLashonda N Desrosiers JXB:[id number] DOB: 03/03/1971 DOA: 12/20/2014  Referring physician: ER physician. PCP: Sonda PrimesAlex Plotnikov, MD   Chief Complaint: Chest pain.  HPI: [name] [name] is a 44 y.o. female with history of diabetes mellitus type 2 and hypertension presents to the ER because of chest pain. Patient started experiencing left arm heaviness 2 days ago. Patient visited the urgent care center last evening because of the heaviness of the left arm. Over that patient experienced chest pressure and was transferred to the ER. In the patient's blood pressure is found to be elevated. EKG and cardiac markers were negative. Patient's blood pressure and symptoms improved with sublingual nitroglycerin. Given the risk factors patient has been admitted to rule out ACS. Patient denies any shortness of breath nausea vomiting productive cough fever chills.   Review of Systems: As presented in the history of presenting illness, rest negative.  Past Medical History  Diagnosis Date  . Chronic headache     Dr Vela ProseLewitt, Neg MRI 2005  . Anemia, iron deficiency   . HTN (hypertension)   . GERD (gastroesophageal reflux disease)   . Elevated glucose 2011  . Diabetes mellitus    Past Surgical History  Procedure Laterality Date  . Partial hysterectomy  2010  . Nose surgery    . Breast reduction surgery    . Cyst removed from vagina    . Induced abortion  1990-1994    x 4  . Tubal ligation    . Abdominal hysterectomy     Social History:  reports that she has never smoked. She has never used smokeless tobacco. She reports that she drinks alcohol. She reports that she does not use illicit drugs. Where does patient live home. Can patient participate in ADLs? Yes.  Allergies  Allergen Reactions  . Ciprofloxacin     REACTION: gittery    Family History:  Family History  Problem Relation Age of Onset  . Hypertension Mother   . Cancer Mother [id number]    Esophageal  .  Hypertension Maternal Grandmother       Prior to Admission medications   Medication Sig Start Date End Date Taking? Authorizing Provider  butalbital-acetaminophen-caffeine (FIORICET) 50-325-40 MG per tablet Take 1-2 tablets by mouth 2 (two) times daily as needed for headache. 02/14/13  Yes Aleksei Plotnikov V, MD  Canagliflozin (INVOKANA) 300 MG TABS Take 1 tablet (300 mg total) by mouth daily. 03/07/14  Yes Romero BellingSean Ellison, MD  dexlansoprazole (DEXILANT) 60 MG capsule Take 1 capsule (60 mg total) by mouth every morning. For indigestion Patient taking differently: Take 60 mg by mouth daily as needed (acid reflux). For indigestion 01/30/13  Yes Aleksei Plotnikov V, MD  fexofenadine (ALLEGRA) 180 MG tablet Take 1 tablet (180 mg total) by mouth daily. Patient taking differently: Take 180 mg by mouth daily as needed for allergies.  04/24/13  Yes Linna HoffJames D Kindl, MD  gabapentin (NEURONTIN) 100 MG capsule Take 1 capsule (100 mg total) by mouth 3 (three) times daily. Patient taking differently: Take 100 mg by mouth 3 (three) times daily as needed (shingles).  10/13/12  Yes Aleksei Plotnikov V, MD  glucose blood (FREESTYLE TEST STRIPS) test strip Use two times daily as instructed. Dx: 250.00 03/30/13  Yes Aleksei Plotnikov V, MD  ketoconazole (NIZORAL) 2 % shampoo Apply 1 application topically 2 (two) times a week. Patient taking differently: Apply 1 application topically daily as needed for irritation.  02/12/14  Yes Tresa GarterAleksei Plotnikov V, MD  Lancets (FREESTYLE) lancets Use two times daily as instructed. Dx: 250.00 03/30/13  Yes Aleksei Plotnikov V, MD  metFORMIN (GLUCOPHAGE) 500 MG tablet Take 2 tablets (1,000 mg total) by mouth 2 (two) times daily with a meal. 02/14/13  Yes Aleksei Plotnikov V, MD  Multiple Vitamins-Calcium (ONE-A-DAY WOMENS PO) Take 1 tablet by mouth daily.   Yes Historical Provider, MD  Olmesartan-Amlodipine-HCTZ (TRIBENZOR) 40-5-25 MG TABS 1 tablet by Per post-pyloric tube route daily. 02/12/14  Yes  Aleksei Plotnikov V, MD  promethazine-codeine (PHENERGAN WITH CODEINE) 6.25-10 MG/5ML syrup Take 5 mLs by mouth every 4 (four) hours as needed for cough. 05/11/14  Yes Aleksei Plotnikov V, MD  sodium chloride (OCEAN NASAL SPRAY) 0.65 % nasal spray Place 1 spray into the nose as needed for congestion. 08/16/12 12/20/14 Yes Johnsie Kindred, NP  Tetrahydrozoline-Zn Sulfate (EYE DROPS ALLERGY RELIEF OP) Place 1 drop into both eyes daily as needed (allergies).   Yes Historical Provider, MD  Alogliptin-Pioglitazone 25-30 MG TABS Take 1 tablet by mouth daily. Patient not taking: Reported on 12/20/2014 07/26/13   [name] V, MD  atorvastatin (LIPITOR) 10 MG tablet Take 1 tablet (10 mg total) by mouth daily. Patient not taking: Reported on 12/20/2014 03/07/14   Romero Belling, MD  bromocriptine (PARLODEL) 2.5 MG tablet Take 0.5 tablets (1.25 mg total) by mouth daily. Patient not taking: Reported on 12/20/2014 07/04/14   Romero Belling, MD  budesonide (RHINOCORT AQUA) 32 MCG/ACT nasal spray Place 1 spray into the nose 2 (two) times daily. One spray each nostril bid Patient not taking: Reported on 12/20/2014 04/24/13   Linna Hoff, MD  Cholecalciferol 1000 UNITS tablet Take 1,000 Units by mouth daily.      Historical Provider, MD  hydrocortisone (ANUSOL-HC) 2.5 % rectal cream Place 1 application rectally 2 (two) times daily. Patient not taking: Reported on 12/20/2014 10/22/14   Joaquim Nam, MD  penciclovir [facility]) 1 % cream Apply 1 application topically every 2 (two) hours. Patient not taking: Reported on 12/20/2014 06/16/14   Hayden Rasmussen, NP  triamcinolone ointment (KENALOG) 0.5 % Apply 1 application topically 4 (four) times daily. To lips Patient not taking: Reported on 12/20/2014 06/18/14   Tresa Garter, MD    Physical Exam: Filed Vitals:   12/20/14 2330 12/20/14 2345 12/21/14 0000 12/21/14 0015  BP: 157/85 159/92 158/97 133/109  Pulse: 81 86 84 86  Temp:      TempSrc:      Resp: Height:       Weight:      SpO2: 99% 94% 98% 98%     General:  Well-developed and nourished.  Eyes: Anicteric no pallor.  ENT: No discharge from the ears eyes nose or mouth.  Neck: No mass felt. No JVD appreciated.  Cardiovascular: S1-S2 heard.  Respiratory: No rhonchi or crepitations.  Abdomen: Soft nontender bowel sounds present.  Skin: No rash.  Musculoskeletal: No edema.  Psychiatric: Appears normal.  Neurologic: Alert awake oriented to time place and person. Moves all extremities.  Labs on Admission:  Basic Metabolic Panel:  Recent Labs Lab 12/20/14 2204  NA 136  K 3.7  CL 101  CO2 20  GLUCOSE 106*  BUN 12  CREATININE 0.71  CALCIUM 9.3   Liver Function Tests: No results for input(s): AST, ALT, ALKPHOS, BILITOT, PROT, ALBUMIN in the last 168 hours. No results for input(s): LIPASE, AMYLASE in the last 168 hours. No results for input(s): AMMONIA in the last 168  hours. CBC:  Recent Labs Lab 12/20/14 2204  WBC 12.1*  HGB 13.9  HCT 41.1  MCV 83.0  PLT 283   Cardiac Enzymes: No results for input(s): CKTOTAL, CKMB, CKMBINDEX, TROPONINI in the last 168 hours.  BNP (last 3 results) No results for input(s): PROBNP in the last 8760 hours. CBG: No results for input(s): GLUCAP in the last 168 hours.  Radiological Exams on Admission: Dg Chest Port 1 View  12/20/2014   CLINICAL DATA:  Chest pain and left arm pain for 1 day.  EXAM: PORTABLE CHEST - 1 VIEW  COMPARISON:  04/20/2011  FINDINGS: Shallow inspiration. Normal heart size and pulmonary vascularity. No focal airspace disease or consolidation in the lungs. No blunting of costophrenic angles. No pneumothorax. Mediastinal contours appear intact.  IMPRESSION: No active disease.   Electronically Signed   By: Burman Nieves M.D.   On: 12/20/2014 21:55    EKG: Independently reviewed. Normal sinus rhythm.  Assessment/Plan Principal Problem:   Chest pain Active Problems:   Diabetes type 2, controlled    Hypertension, uncontrolled   1. Chest pain - symptoms improved with nitroglycerin. Given the character of the pain and risk factors including diabetes and hypertension we will cycle cardiac markers. Keep patient nothing by mouth past for a.m. in anticipation of possible procedures. Check 2-D echo. Aspirin. I have also place patient on nitroglycerin patch for now. 2. Hypertension uncontrolled - I have placed patient on Nitropatch now. When necessary IV hydralazine for systolic blood pressure more than 160. Continue home medication and closely follow blood pressure trends. Uncontrolled blood pressure could also be contributing to patient's symptoms. 3. Diabetes mellitus type 2 controlled - hold metformin while inpatient continue other medication and closely follow CBGs with sliding scale coverage. 4. Mild leukocytosis patient is afebrile closely observe.   DVT Prophylaxis Lovenox.  Code Status: Full code.  Family Communication: None.  Disposition Plan: Admit for observation.    Retha Bither N. [facility] Pager [phone].  If 7PM-7AM, please contact night-coverage [email or web address] Password [facility] 12/21/2014, 12:58 AM

## 2014-12-21 NOTE — Progress Notes (Signed)
Pt discharged to home per MD order. Pt received and reviewed all discharge instructions and medication information including follow-up appointments and prescription information. Pt verbalized understanding. Pt alert and oriented at discharge with no complaints of pain. Pt IV and telemetry box removed prior to discharge. Pt escorted to private vehicle via wheelchair by nurse tech. Bereket Gernert C  

## 2014-12-21 NOTE — Progress Notes (Signed)
Seen and briefly examined. RRR, lungs clear, abd s/NT, No edema, A&Ox3, strength 5/5 UE bilaterally. Chest pain overnight - ruled out for ACS. Risk factors for CAD including HTN, DM2, obesity, pain improved with nitroglycerin. Would recommend a nuclear stress test today.  [name] NoseKenneth C. [name], MD, [facility] Attending Cardiologist [facility]

## 2014-12-24 ENCOUNTER — Ambulatory Visit: Payer: 59 | Admitting: Endocrinology

## 2015-01-18 ENCOUNTER — Ambulatory Visit (INDEPENDENT_AMBULATORY_CARE_PROVIDER_SITE_OTHER): Payer: 59 | Admitting: Internal Medicine

## 2015-01-18 ENCOUNTER — Encounter: Payer: Self-pay | Admitting: Internal Medicine

## 2015-01-18 ENCOUNTER — Other Ambulatory Visit (INDEPENDENT_AMBULATORY_CARE_PROVIDER_SITE_OTHER): Payer: 59

## 2015-01-18 VITALS — BP 145/110 | HR 100 | Temp 98.8°F | Ht 64.0 in | Wt 222.5 lb

## 2015-01-18 DIAGNOSIS — IMO0002 Reserved for concepts with insufficient information to code with codable children: Secondary | ICD-10-CM

## 2015-01-18 DIAGNOSIS — E1129 Type 2 diabetes mellitus with other diabetic kidney complication: Secondary | ICD-10-CM

## 2015-01-18 DIAGNOSIS — E1165 Type 2 diabetes mellitus with hyperglycemia: Secondary | ICD-10-CM

## 2015-01-18 DIAGNOSIS — J069 Acute upper respiratory infection, unspecified: Secondary | ICD-10-CM

## 2015-01-18 DIAGNOSIS — R000 Tachycardia, unspecified: Secondary | ICD-10-CM

## 2015-01-18 DIAGNOSIS — J209 Acute bronchitis, unspecified: Secondary | ICD-10-CM

## 2015-01-18 DIAGNOSIS — I10 Essential (primary) hypertension: Secondary | ICD-10-CM

## 2015-01-18 LAB — MICROALBUMIN / CREATININE URINE RATIO
CREATININE, U: 169.3 mg/dL
MICROALB UR: 53 mg/dL — AB (ref 0.0–1.9)
Microalb Creat Ratio: 31.3 mg/g — ABNORMAL HIGH (ref 0.0–30.0)

## 2015-01-18 LAB — HEMOGLOBIN A1C: Hgb A1c MFr Bld: 7.4 % — ABNORMAL HIGH (ref 4.6–6.5)

## 2015-01-18 LAB — TSH: TSH: 1.15 u[IU]/mL (ref 0.35–4.50)

## 2015-01-18 LAB — T4, FREE: Free T4: 0.91 ng/dL (ref 0.60–1.60)

## 2015-01-18 MED ORDER — HYDROCODONE-HOMATROPINE 5-1.5 MG/5ML PO SYRP: 5.0000 mL | ORAL_SOLUTION | Freq: Four times a day (QID) | ORAL | Status: DC | PRN

## 2015-01-18 MED ORDER — AMOXICILLIN 500 MG PO CAPS: 500.0000 mg | ORAL_CAPSULE | Freq: Three times a day (TID) | ORAL | Status: DC

## 2015-01-18 NOTE — Patient Instructions (Addendum)
Plain Mucinex (NOT D) for thick secretions ;force NON dairy fluids .   Nasal cleansing in the shower as discussed with lather of mild shampoo.After 10 seconds wash off lather while  exhaling through nostrils. Make sure that all residual soap is removed to prevent irritation.  Flonase OR Nasacort AQ 1 spray in each nostril twice a day as needed. Use the "crossover" technique into opposite nostril spraying toward opposite ear @ 45 degree angle, not straight up into nostril.  Plain Allegra (NOT D )  160 daily , Loratidine 10 mg , OR Zyrtec 10 mg @ bedtime  as needed for itchy eyes & sneezing.  To prevent tachycardia or premature beats ( see above) , avoid stimulants such as decongestants, diet pills, nicotine, or caffeine (coffee, tea, cola, or chocolate) to excess.   Minimal Blood Pressure Goal= AVERAGE < 140/90;  Ideal is an AVERAGE < 135/85. This AVERAGE should be calculated from @ least 5-7 BP readings taken @ different times of day on different days of week. You should not respond to isolated BP readings , but rather the AVERAGE for that week .Please bring your  blood pressure cuff to office visits to verify that it is reliable.It  can also be checked against the blood pressure device at the pharmacy. Finger or wrist cuffs are not dependable; an arm cuff is.

## 2015-01-18 NOTE — Progress Notes (Signed)
Pre visit review using our clinic review tool, if applicable. No additional management support is needed unless otherwise documented below in the visit note. 

## 2015-01-18 NOTE — Progress Notes (Signed)
   Subjective:    Patient ID: [name] [name], female    DOB: 02/19/1971, 44 y.o.   MRN: [id number]  HPI  Symptoms began 01/09/15 as a "cold" which she describes as tingling in her throat and intermittent rhinitis with clear drainage.  As of 4-5 days ago she developed cough which lasts a few seconds and causes pain in throat. This is productive of yellow sputum with some pink tinge .  She also describes scant yellow nasal  secretions.  She used over-the-counter Tylenol and other cold preparations with no benefit  She's never smoked. She had asthma as a child only.  She is a diabetic; she states her glucose typically is 100 in the morning.  The last A1c on record was 06/2014 with a value of 7.6%. Urine microalbumin was 17.2.  Hospital records 1/7-12/21/14 for chest pain & HTN were reviewed.  She states that her blood pressure range from a low of 124/84-high of 132/102.    Review of Systems She denies fever, chills, sweats, itchy/watery eyes, otic pain, otic discharge  She does not have significant frontal sinus or facial pain.  She denies any bleeding dyscrasias despite the history of pink tinged sputum.  She denies blurred vision, double vision, or loss of vision.  Epistaxis, hemoptysis, hematuria, melena, or rectal bleeding denied.   There is no abnormal bruising , bleeding, or difficulty stopping bleeding with injury.     Objective:   Physical Exam  Pertinent or positive findings include: She has injection of the sclera without conjunctivitis. She has nonsustained lateral nystagmus bilaterally. Extraocular motion is intact. Vision is intact as well. The nares are inflamed; there is a polyp on the right with edema.  Appears healthy and well-nourished & in no acute distress  No carotid bruits are present.No neck vein distention present at 10 - 15 degrees. Thyroid normal to palpation  Heart rhythm regularl with no gallop or murmur  Chest is clear with no increased work  of breathing  There is no evidence of aortic aneurysm or renal artery bruits  Abdomen soft with no organomegaly or masses. No HJR  No clubbing, cyanosis or edema present.  Pedal pulses are intact   No ischemic skin changes are present . Fingernails healthy   Alert and oriented. Strength, tone normal       Assessment & Plan:  #1 upper respiratory tract infection  #2 acute bronchitis with pink-tinged secretions. Clinically she does not have significant hemoptysis or other bleeding dyscrasia.  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 There is no suggestion of orbital cellulitis.  #4 diabetes; fasting blood sugars are excellent but her last A1c in July 2015 and his diabetes was uncontrolled and associated with urine microalbumin elevation.  #5 hypertension; blood pressure goals discussed  #6 tachycardia. She'll be asked to avoid stimulants. TFTs will be checked.  See orders and recommendations

## 2015-01-30 ENCOUNTER — Ambulatory Visit (INDEPENDENT_AMBULATORY_CARE_PROVIDER_SITE_OTHER)
Admission: RE | Admit: 2015-01-30 | Discharge: 2015-01-30 | Disposition: A | Discharge status: Home / Self Care | Payer: 59 | Source: Ambulatory Visit | Attending: Internal Medicine | Admitting: Internal Medicine

## 2015-01-30 ENCOUNTER — Ambulatory Visit (INDEPENDENT_AMBULATORY_CARE_PROVIDER_SITE_OTHER): Payer: 59 | Admitting: Internal Medicine

## 2015-01-30 ENCOUNTER — Encounter: Payer: Self-pay | Admitting: Internal Medicine

## 2015-01-30 VITALS — BP 146/110 | HR 107 | Temp 99.2°F | Wt 222.0 lb

## 2015-01-30 DIAGNOSIS — E119 Type 2 diabetes mellitus without complications: Secondary | ICD-10-CM

## 2015-01-30 DIAGNOSIS — J189 Pneumonia, unspecified organism: Secondary | ICD-10-CM

## 2015-01-30 DIAGNOSIS — I10 Essential (primary) hypertension: Secondary | ICD-10-CM

## 2015-01-30 MED ORDER — METFORMIN HCL 500 MG PO TABS: 1000.0000 mg | ORAL_TABLET | Freq: Two times a day (BID) | ORAL | Status: DC

## 2015-01-30 MED ORDER — CLARITHROMYCIN 500 MG PO TABS: 500.0000 mg | ORAL_TABLET | Freq: Two times a day (BID) | ORAL | Status: DC

## 2015-01-30 MED ORDER — CEFUROXIME AXETIL 500 MG PO TABS: 500.0000 mg | ORAL_TABLET | Freq: Two times a day (BID) | ORAL | Status: DC

## 2015-01-30 MED ORDER — OLMESARTAN-AMLODIPINE-HCTZ 40-10-25 MG PO TABS: 1.0000 | ORAL_TABLET | ORAL | Status: DC

## 2015-01-30 NOTE — Patient Instructions (Signed)
To work on 02/04/15 if ok

## 2015-01-30 NOTE — Assessment & Plan Note (Signed)
?  LLL CXR Biaxin and Ceftin po

## 2015-01-30 NOTE — Assessment & Plan Note (Signed)
Continue with current prescription therapy as reflected on the Med list. Improve diet 

## 2015-01-30 NOTE — Assessment & Plan Note (Signed)
NAS We increased Amlodipine in Tribenzor to 10 mg

## 2015-01-30 NOTE — Progress Notes (Signed)
Pre visit review using our clinic review tool, if applicable. No additional management support is needed unless otherwise documented below in the visit note. 

## 2015-01-30 NOTE — Progress Notes (Signed)
Subjective:   C/o sick x 3 weeks  Cough This is a new problem. The current episode started 1 to 4 weeks ago. The problem has been gradually worsening. The cough is productive of bloody sputum and productive of purulent sputum. Pertinent negatives include no chills, headaches, myalgias, rash, rhinorrhea, shortness of breath or wheezing. Treatments tried: abx. The treatment provided no relief. Her past medical history is significant for asthma. There is no history of bronchitis.  Sore Throat  Associated symptoms include coughing. Pertinent negatives include no abdominal pain, congestion, headaches, shortness of breath, stridor, trouble swallowing or vomiting.  Lost voice    The patient needs to address  chronic hypertension that has been well controlled with medicines; to address chronic  hyperlipidemia controlled with medicines as well; and to address type 2 chronic diabetes, controlled with medical treatment and diet.   Review of Systems  Constitutional: Negative for chills, activity change, appetite change, fatigue and unexpected weight change.  HENT: Negative for congestion, mouth sores, rhinorrhea, sinus pressure, trouble swallowing and voice change.   Eyes: Negative for visual disturbance.  Respiratory: Positive for cough and chest tightness. Negative for shortness of breath, wheezing and stridor.   Cardiovascular: Negative for palpitations and leg swelling.  Gastrointestinal: Negative for nausea, vomiting, abdominal pain and rectal pain.       GERD - mild   Genitourinary: Negative for urgency, frequency, hematuria, flank pain, decreased urine volume, difficulty urinating and vaginal pain.  Musculoskeletal: Negative for myalgias, back pain and gait problem.  Skin: Negative for pallor, rash and wound.  Neurological: Negative for dizziness, tremors, weakness, numbness and headaches.  Hematological: Negative for adenopathy.  Psychiatric/Behavioral: Negative for confusion, sleep  disturbance, decreased concentration and agitation. The patient is not nervous/anxious.    Wt Readings from Last 3 Encounters:  01/30/15 222 lb (100.699 kg)  01/18/15 222 lb 8 oz (100.925 kg)  12/21/14 223 lb 11.2 oz (101.47 kg)   BP Readings from Last 3 Encounters:  01/30/15 146/110  01/18/15 145/110  12/21/14 125/81         Objective:   Physical Exam  Constitutional: She appears well-developed. No distress.  Obese   HENT:  Head: Normocephalic.  Right Ear: External ear normal.  Left Ear: External ear normal.  Nose: Nose normal.  Mouth/Throat: Oropharynx is clear and moist.  Eyes: Conjunctivae are normal. Pupils are equal, round, and reactive to light. Right eye exhibits no discharge. Left eye exhibits no discharge.  Neck: Normal range of motion. Neck supple. No JVD present. No tracheal deviation present. No thyromegaly present.  Cardiovascular: Normal rate, regular rhythm and normal heart sounds.   Pulmonary/Chest: No stridor. No respiratory distress. She has no wheezes.  Abdominal: Soft. Bowel sounds are normal. She exhibits no distension and no mass. There is no tenderness. There is no rebound and no guarding.  Musculoskeletal: She exhibits no edema or tenderness.  Lymphadenopathy:    She has no cervical adenopathy.  Neurological: She displays normal reflexes. No cranial nerve deficit. She exhibits normal muscle tone. Coordination normal.  Skin: No rash noted. No erythema.  Psychiatric: She has a normal mood and affect. Her behavior is normal. Judgment and thought content normal.    Lab Results  Component Value Date   WBC 11.0* 12/21/2014   HGB 13.5 12/21/2014   HCT 39.1 12/21/2014   PLT 267 12/21/2014   GLUCOSE 170* 12/21/2014   CHOL 189 07/21/2012   TRIG 67.0 07/21/2012   HDL 54.00 07/21/2012   LDLDIRECT  137.4 11/04/2010   LDLCALC 122* 07/21/2012   ALT 19 12/21/2014   AST 18 12/21/2014   NA 133* 12/21/2014   K 3.6 12/21/2014   CL 94* 12/21/2014    CREATININE 0.82 12/21/2014   BUN 10 12/21/2014   CO2 25 12/21/2014   TSH 1.15 01/18/2015   HGBA1C 7.4* 01/18/2015   MICROALBUR 53.0* 01/18/2015         Assessment & Plan:  Patient ID: [name] [name], female   DOB: [birth year], 44 y.o.   MRN: [id number]

## 2015-02-07 ENCOUNTER — Telehealth: Payer: Self-pay | Admitting: Internal Medicine

## 2015-02-07 MED ORDER — FLUCONAZOLE 150 MG PO TABS: 150.0000 mg | ORAL_TABLET | ORAL | Status: DC

## 2015-02-07 NOTE — Telephone Encounter (Signed)
Pt called in said that she is getting yeast infection from antibiotic and wants to know if Dr can call her in some meds for it?

## 2015-02-07 NOTE — Telephone Encounter (Signed)
Please advise in PCPs absence.  

## 2015-02-07 NOTE — Telephone Encounter (Signed)
Have sent in for fluconazole pill for the yeast. Take 1 today, if no improvement take another pill on Sunday. If no improvement there is a 3rd pill she can take on Wednesday.

## 2015-02-08 NOTE — Telephone Encounter (Signed)
Pt informed

## 2015-02-25 ENCOUNTER — Encounter: Payer: Self-pay | Admitting: Endocrinology

## 2015-02-25 ENCOUNTER — Ambulatory Visit (INDEPENDENT_AMBULATORY_CARE_PROVIDER_SITE_OTHER): Payer: 59 | Admitting: Endocrinology

## 2015-02-25 VITALS — BP 118/84 | HR 89 | Temp 98.2°F | Ht 64.0 in | Wt 224.0 lb

## 2015-02-25 DIAGNOSIS — E1121 Type 2 diabetes mellitus with diabetic nephropathy: Secondary | ICD-10-CM

## 2015-02-25 DIAGNOSIS — E049 Nontoxic goiter, unspecified: Secondary | ICD-10-CM

## 2015-02-25 MED ORDER — LINAGLIPTIN 5 MG PO TABS: 5.0000 mg | ORAL_TABLET | Freq: Every day | ORAL | Status: DC

## 2015-02-25 NOTE — Patient Instructions (Addendum)
check your blood sugar once a day.  vary the time of day when you check, between before the 3 meals, and at bedtime.  also check if you have symptoms of your blood sugar being too high or too low.  please keep a record of the readings and bring it to your next appointment here.  You can write it on any piece of paper.  please call us sooner if your blood sugar goes below 70, or if you have a lot of readings over 200. Please come back for a follow-up appointment in 3 months.  Let's recheck the thyroid ultrasound.  you will receive a phone call, about a day and time for an appointment most of the time, a "lumpy thyroid" will eventually become overactive.  this is usually a slow process, happening over the span of years.   i have sent a prescription to your pharmacy, to add "tradjenta."

## 2015-02-25 NOTE — Progress Notes (Signed)
Subjective:    Patient ID: [name] [name], female    DOB: 06/04/1971, 44 y.o.   MRN: [id number]  HPI  Pt returns for f/u of diabetes mellitus: DM type: 2 Dx'ed: 2011 Complications: nephropathy Therapy: 2 oral meds GDM: never DKA: never Severe hypoglycemia: never Pancreatitis: never Other: She declines weight-loss surgery; she has never taken insulin.  Interval history:   no cbg record, but states cbg's are well-controlled.    Pt also returns for f/u of small multinodular goiter (dx'ed 2015; ultrasonigraphic of the nodules did not merit bx). he does notice the goiter Past Medical History  Diagnosis Date  . Chronic headache     Dr Vela ProseLewitt, Neg MRI 2005  . Anemia, iron deficiency   . HTN (hypertension)   . GERD (gastroesophageal reflux disease)   . Elevated glucose 2011  . Diabetes mellitus     Past Surgical History  Procedure Laterality Date  . Partial hysterectomy  2010  . Nose surgery    . Breast reduction surgery    . Cyst removed from vagina    . Induced abortion  1990-1994    x 4  . Tubal ligation    . Abdominal hysterectomy      History   Social History  . Marital Status: Single    Spouse Name: N/A  . Number of Children: 2  . Years of Education: N/A   Occupational History  . Not on file.   Social History Main Topics  . Smoking status: Never Smoker   . Smokeless tobacco: Never Used  . Alcohol Use: 0.0 oz/week    0 Standard drinks or equivalent per week     Comment: 2 wine or mixed drinks on a weekend  . Drug Use: No  . Sexual Activity: Not on file   Other Topics Concern  . Not on file   Social History Narrative   Regular exercise - YES    Current Outpatient Prescriptions on File Prior to Visit  Medication Sig Dispense Refill  . butalbital-acetaminophen-caffeine (FIORICET) 50-325-40 MG per tablet Take 1-2 tablets by mouth 2 (two) times daily as needed for headache. (Patient not taking: Reported on 01/18/2015) 30 tablet 0  . Canagliflozin  (INVOKANA) 300 MG TABS Take 1 tablet (300 mg total) by mouth daily. 30 tablet 11  . Cholecalciferol 1000 UNITS tablet Take 1,000 Units by mouth daily.      . clarithromycin (BIAXIN) 500 MG tablet Take 1 tablet (500 mg total) by mouth 2 (two) times daily. 20 tablet 0  . dexlansoprazole (DEXILANT) 60 MG capsule Take 1 capsule (60 mg total) by mouth every morning. For indigestion (Patient taking differently: Take 60 mg by mouth daily as needed (acid reflux). For indigestion) 30 capsule 11  . fexofenadine (ALLEGRA) 180 MG tablet Take 1 tablet (180 mg total) by mouth daily. (Patient taking differently: Take 180 mg by mouth daily as needed for allergies. ) 30 tablet 1  . fluconazole (DIFLUCAN) 150 MG tablet Take 1 tablet (150 mg total) by mouth every 3 (three) days. 3 tablet 0  . gabapentin (NEURONTIN) 100 MG capsule Take 1 capsule (100 mg total) by mouth 3 (three) times daily. (Patient taking differently: Take 100 mg by mouth 3 (three) times daily as needed (shingles). ) 90 capsule 3  . glucose blood (FREESTYLE TEST STRIPS) test strip Use two times daily as instructed. Dx: 250.00 100 each 6  . ketoconazole (NIZORAL) 2 % shampoo Apply 1 application topically 2 (two) times a week. (  Patient taking differently: Apply 1 application topically daily as needed for irritation. ) 120 mL 5  . Lancets (FREESTYLE) lancets Use two times daily as instructed. Dx: 250.00 100 each 6  . metFORMIN (GLUCOPHAGE) 500 MG tablet Take 2 tablets (1,000 mg total) by mouth 2 (two) times daily with a meal. 120 tablet 11  . Multiple Vitamins-Calcium (ONE-A-DAY WOMENS PO) Take 1 tablet by mouth daily.    . Olmesartan-Amlodipine-HCTZ (TRIBENZOR) 40-10-25 MG TABS Take 1 tablet by mouth 1 day or 1 dose. 30 tablet 11  . Tetrahydrozoline-Zn Sulfate (EYE DROPS ALLERGY RELIEF OP) Place 1 drop into both eyes daily as needed (allergies).    . [DISCONTINUED] zolpidem (AMBIEN) 10 MG tablet Take 0.5-1 tablets (5-10 mg total) by mouth at bedtime as  needed for sleep. 30 tablet 3   No current facility-administered medications on file prior to visit.    Allergies  Allergen Reactions  . Ciprofloxacin     REACTION: gittery    Family History  Problem Relation Age of Onset  . Hypertension Mother   . Cancer Mother 9    Esophageal  . Hypertension Maternal Grandmother     BP 118/84 mmHg  Pulse 89  Temp(Src) 98.2 F (36.8 C) (Oral)  Ht  (1.626 m)  Wt 224 lb (101.606 kg)  BMI 38.43 kg/m2  SpO2 97%   Review of Systems She has lost weight denies neck pain    Objective:   Physical Exam VITAL SIGNS:  See vs page GENERAL: no distress NECK: There is no palpable thyroid enlargement.  No thyroid nodule is palpable.  No palpable lymphadenopathy at the anterior neck. Pulses: dorsalis pedis intact bilat.   MSK: no deformity of the feet CV: no leg edema Skin:  no ulcer on the feet.  normal color and temp on the feet. Neuro: sensation is intact to touch on the feet   Lab Results  Component Value Date   HGBA1C 7.4* 01/18/2015   Lab Results  Component Value Date   TSH 1.15 01/18/2015      Assessment & Plan:  Multinodular goiter: due for f/u bx.   DM: she needs increased rx.   Patient is advised the following: Patient Instructions  check your blood sugar once a day.  vary the time of day when you check, between before the 3 meals, and at bedtime.  also check if you have symptoms of your blood sugar being too high or too low.  please keep a record of the readings and bring it to your next appointment here.  You can write it on any piece of paper.  please call us sooner if your blood sugar goes below 70, or if you have a lot of readings over 200. Please come back for a follow-up appointment in 3 months.  Let's recheck the thyroid ultrasound.  you will receive a phone call, about a day and time for an appointment most of the time, a "lumpy thyroid" will eventually become overactive.  this is usually a slow process,  happening over the span of years.   i have sent a prescription to your pharmacy, to add "tradjenta."

## 2015-03-05 ENCOUNTER — Ambulatory Visit
Admission: RE | Admit: 2015-03-05 | Discharge: 2015-03-05 | Disposition: A | Discharge status: Home / Self Care | Payer: 59 | Source: Ambulatory Visit | Attending: Endocrinology | Admitting: Endocrinology

## 2015-03-05 DIAGNOSIS — E049 Nontoxic goiter, unspecified: Secondary | ICD-10-CM

## 2015-03-14 IMAGING — CR DG CHEST 1V PORT
1 series · 1 of 1 positions shown · non-contrast
Comparison: 04/20/2011

CLINICAL DATA: Chest pain and left arm pain for 1 day.

EXAM:
PORTABLE CHEST - 1 VIEW

[AP]
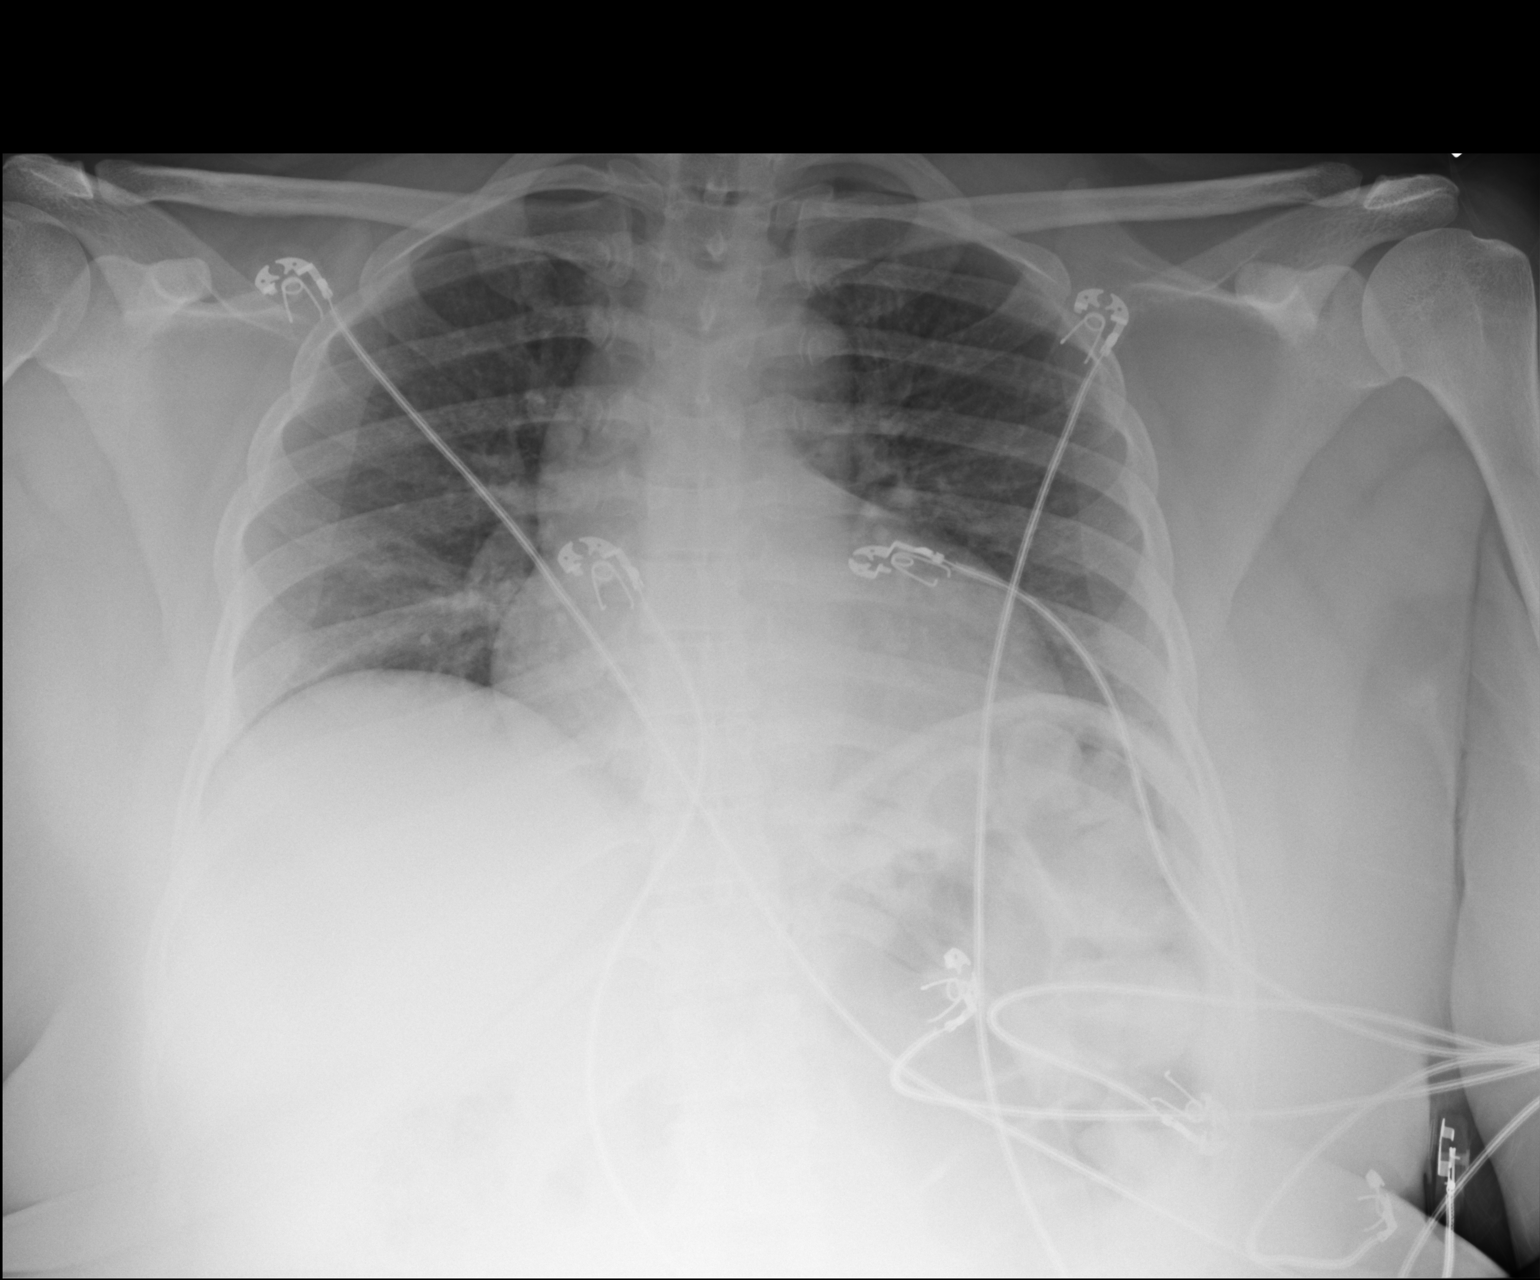

[1 of 1 positions shown; findings below may reference images not displayed]

FINDINGS: Shallow inspiration. Normal heart size and pulmonary vascularity. No
focal airspace disease or consolidation in the lungs. No blunting of
costophrenic angles. No pneumothorax. Mediastinal contours appear
intact.
IMPRESSION: No active disease.

## 2015-05-24 ENCOUNTER — Ambulatory Visit: Payer: 59 | Admitting: Endocrinology

## 2015-08-21 ENCOUNTER — Encounter: Payer: Self-pay | Admitting: Endocrinology

## 2015-08-21 ENCOUNTER — Ambulatory Visit (INDEPENDENT_AMBULATORY_CARE_PROVIDER_SITE_OTHER): Payer: 59 | Admitting: Endocrinology

## 2015-08-21 VITALS — BP 116/60 | HR 77 | Temp 97.9°F | Ht 64.0 in | Wt 216.0 lb

## 2015-08-21 DIAGNOSIS — E119 Type 2 diabetes mellitus without complications: Secondary | ICD-10-CM | POA: Diagnosis not present

## 2015-08-21 LAB — POCT GLYCOSYLATED HEMOGLOBIN (HGB A1C): Hemoglobin A1C: 7.5

## 2015-08-21 MED ORDER — BROMOCRIPTINE MESYLATE 2.5 MG PO TABS: 1.2500 mg | ORAL_TABLET | Freq: Every day | ORAL | Status: DC

## 2015-08-21 NOTE — Progress Notes (Signed)
Subjective:    Patient ID: [name] [name], female    DOB: 1971/11/22, 44 y.o.   MRN: [id number]  HPI Pt returns for f/u of diabetes mellitus: DM type: 2 Dx'ed: 2011 Complications: nephropathy Therapy: 3 oral meds.   GDM: never DKA: never Severe hypoglycemia: never Pancreatitis: never Other: She declines weight-loss surgery; she has never taken insulin.  Interval history:  no cbg record, but states cbg's are well-controlled.  pt states she feels well in general. Pt also returns for f/u of small multinodular goiter (dx'ed 2015; ultrasonigraphic of the nodules did not merit bx). she does notice the goiter.   Past Medical History  Diagnosis Date  . Chronic headache     Dr Vela Prose, Neg MRI 2005  . Anemia, iron deficiency   . HTN (hypertension)   . GERD (gastroesophageal reflux disease)   . Elevated glucose 2011  . Diabetes mellitus     Past Surgical History  Procedure Laterality Date  . Partial hysterectomy  2010  . Nose surgery    . Breast reduction surgery    . Cyst removed from vagina    . Induced abortion  1990-1994    x 4  . Tubal ligation    . Abdominal hysterectomy      Social History   Social History  . Marital Status: Single    Spouse Name: N/A  . Number of Children: 2  . Years of Education: N/A   Occupational History  . Not on file.   Social History Main Topics  . Smoking status: Never Smoker   . Smokeless tobacco: Never Used  . Alcohol Use: 0.0 oz/week    0 Standard drinks or equivalent per week     Comment: 2 wine or mixed drinks on a weekend  . Drug Use: No  . Sexual Activity: Not on file   Other Topics Concern  . Not on file   Social History Narrative   Regular exercise - YES    Current Outpatient Prescriptions on File Prior to Visit  Medication Sig Dispense Refill  . Canagliflozin (INVOKANA) 300 MG TABS Take 1 tablet (300 mg total) by mouth daily. 30 tablet 11  . Cholecalciferol 1000 UNITS tablet Take 1,000 Units by mouth daily.        Marland Kitchen dexlansoprazole (DEXILANT) 60 MG capsule Take 1 capsule (60 mg total) by mouth every morning. For indigestion (Patient taking differently: Take 60 mg by mouth daily as needed (acid reflux). For indigestion) 30 capsule 11  . fexofenadine (ALLEGRA) 180 MG tablet Take 1 tablet (180 mg total) by mouth daily. (Patient taking differently: Take 180 mg by mouth daily as needed for allergies. ) 30 tablet 1  . glucose blood (FREESTYLE TEST STRIPS) test strip Use two times daily as instructed. Dx: 250.00 100 each 6  . ketoconazole (NIZORAL) 2 % shampoo Apply 1 application topically 2 (two) times a week. (Patient taking differently: Apply 1 application topically daily as needed for irritation. ) 120 mL 5  . Lancets (FREESTYLE) lancets Use two times daily as instructed. Dx: 250.00 100 each 6  . linagliptin (TRADJENTA) 5 MG TABS tablet Take 1 tablet (5 mg total) by mouth daily. 30 tablet 11  . metFORMIN (GLUCOPHAGE) 500 MG tablet Take 2 tablets (1,000 mg total) by mouth 2 (two) times daily with a meal. 120 tablet 11  . Multiple Vitamins-Calcium (ONE-A-DAY WOMENS PO) Take 1 tablet by mouth daily.    . Olmesartan-Amlodipine-HCTZ (TRIBENZOR) 40-10-25 MG TABS Take 1 tablet by  mouth 1 day or 1 dose. 30 tablet 11  . Tetrahydrozoline-Zn Sulfate (EYE DROPS ALLERGY RELIEF OP) Place 1 drop into both eyes daily as needed (allergies).    . butalbital-acetaminophen-caffeine (FIORICET) 50-325-40 MG per tablet Take 1-2 tablets by mouth 2 (two) times daily as needed for headache. (Patient not taking: Reported on 01/18/2015) 30 tablet 0  . gabapentin (NEURONTIN) 100 MG capsule Take 1 capsule (100 mg total) by mouth 3 (three) times daily. (Patient not taking: Reported on 08/21/2015) 90 capsule 3  . [DISCONTINUED] zolpidem (AMBIEN) 10 MG tablet Take 0.5-1 tablets (5-10 mg total) by mouth at bedtime as needed for sleep. 30 tablet 3   No current facility-administered medications on file prior to visit.    Allergies  Allergen  Reactions  . Ciprofloxacin     REACTION: gittery    Family History  Problem Relation Age of Onset  . Hypertension Mother   . Cancer Mother 12    Esophageal  . Hypertension Maternal Grandmother     BP 116/60 mmHg  Pulse 77  Temp(Src) 97.9 F (36.6 C) (Oral)  Ht 5\' 4"  (1.626 m)  Wt 216 lb (97.977 kg)  BMI 37.06 kg/m2  SpO2 97%  Review of Systems She denies hypoglycemia    Objective:   Physical Exam VITAL SIGNS:  See vs page GENERAL: no distress Pulses: dorsalis pedis intact bilat.   MSK: no deformity of the feet CV: no leg edema Skin:  no ulcer on the feet.  normal color and temp on the feet. Neuro: sensation is intact to touch on the feet.    A1c=7.5%    Assessment & Plan:  DM: Needs increased rx, if it can be done with a regimen that avoids or minimizes hypoglycemia.     Patient is advised the following: Patient Instructions  check your blood sugar once a day.  vary the time of day when you check, between before the 3 meals, and at bedtime.  also check if you have symptoms of your blood sugar being too high or too low.  please keep a record of the readings and bring it to your next appointment here.  You can write it on any piece of paper.  please call us sooner if your blood sugar goes below 70, or if you have a lot of readings over 200. Please come back for a follow-up appointment in 3 months.  i have sent a prescription to your pharmacy, to add "bromocriptine," to help your blood sugar. It has possible side effects of nausea and dizziness.  These go away with time.  You can avoid these by taking it at bedtime.

## 2015-08-21 NOTE — Patient Instructions (Signed)
check your blood sugar once a day.  vary the time of day when you check, between before the 3 meals, and at bedtime.  also check if you have symptoms of your blood sugar being too high or too low.  please keep a record of the readings and bring it to your next appointment here.  You can write it on any piece of paper.  please call us sooner if your blood sugar goes below 70, or if you have a lot of readings over 200. Please come back for a follow-up appointment in 3 months.  i have sent a prescription to your pharmacy, to add "bromocriptine," to help your blood sugar. It has possible side effects of nausea and dizziness.  These go away with time.  You can avoid these by taking it at bedtime.

## 2015-11-21 ENCOUNTER — Ambulatory Visit: Payer: 59 | Admitting: Endocrinology

## 2016-02-05 ENCOUNTER — Ambulatory Visit: Payer: 59 | Admitting: Endocrinology

## 2016-03-06 ENCOUNTER — Encounter: Payer: Self-pay | Admitting: Gastroenterology

## 2016-03-17 ENCOUNTER — Encounter: Payer: Self-pay | Admitting: Family

## 2016-03-17 ENCOUNTER — Ambulatory Visit (INDEPENDENT_AMBULATORY_CARE_PROVIDER_SITE_OTHER): Payer: 59 | Admitting: Family

## 2016-03-17 VITALS — BP 118/82 | HR 71 | Temp 97.5°F | Resp 16 | Ht 64.0 in | Wt 232.0 lb

## 2016-03-17 DIAGNOSIS — S7001XA Contusion of right hip, initial encounter: Secondary | ICD-10-CM | POA: Diagnosis not present

## 2016-03-17 DIAGNOSIS — S7000XA Contusion of unspecified hip, initial encounter: Secondary | ICD-10-CM | POA: Insufficient documentation

## 2016-03-17 MED ORDER — METFORMIN HCL 500 MG PO TABS: 1000.0000 mg | ORAL_TABLET | Freq: Two times a day (BID) | ORAL | Status: DC

## 2016-03-17 MED ORDER — OLMESARTAN-AMLODIPINE-HCTZ 40-10-25 MG PO TABS: 1.0000 | ORAL_TABLET | ORAL | Status: DC

## 2016-03-17 MED ORDER — GLUCOSE BLOOD VI STRP: ORAL_STRIP | Status: DC

## 2016-03-17 NOTE — Patient Instructions (Signed)
Thank you for choosing [facility].  Summary/Instructions:  Ice 2-3 times per day and before bed as needed.  Exercises daily.  May alternate heat after 3-4 days.  Tylenol, Aleve (2x daily) or ibuprofen (2-3 tablets 3 x per day)  If your symptoms worsen or fail to improve, please contact our office for further instruction, or in case of emergency go directly to the emergency room at the closest medical facility.   Generic Hip Exercises RANGE OF MOTION (ROM) AND STRETCHING EXERCISES  These exercises may help you when beginning to rehabilitate your injury. Doing them too aggressively can worsen your condition. Complete them slowly and gently. Your symptoms may resolve with or without further involvement from your physician, physical therapist or athletic trainer. While completing these exercises, remember:   Restoring tissue flexibility helps normal motion to return to the joints. This allows healthier, less painful movement and activity.  An effective stretch should be held for at least 30 seconds.  A stretch should never be painful. You should only feel a gentle lengthening or release in the stretched tissue. If these stretches worsen your symptoms even when done gently, consult your physician, physical therapist or athletic trainer. STRETCH - Hamstrings, Supine   Lie on your back. Loop a belt or towel over the ball of your right / left foot.  Straighten your right / left knee and slowly pull on the belt to raise your leg. Do not allow the right / left knee to bend. Keep your opposite leg flat on the floor.  Raise the leg until you feel a gentle stretch behind your right / left knee or thigh. Hold this position for __________ seconds. Repeat __________ times. Complete this stretch __________ times per day.  STRETCH - Hip Rotators   Lie on your back on a firm surface. Grasp your right / left knee with your right / left hand and your ankle with your opposite hand.  Keeping your  hips and shoulders firmly planted, gently pull your right / left knee and rotate your lower leg toward your opposite shoulder until you feel a stretch in your buttocks.  Hold this stretch for __________ seconds. Repeat this stretch __________ times. Complete this stretch __________ times per day. STRETCH - Hamstrings/Adductors, V-Sit   Sit on the floor with your legs extended in a large "V," keeping your knees straight.  With your head and chest upright, bend at your waist reaching for your right foot to stretch your left adductors.  You should feel a stretch in your left inner thigh. Hold for __________ seconds.  Return to the upright position to relax your leg muscles.  Continuing to keep your chest upright, bend straight forward at your waist to stretch your hamstrings.  You should feel a stretch behind both of your thighs and/or knees. Hold for __________ seconds.  Return to the upright position to relax your leg muscles.  Repeat steps 2 through 4 for opposite leg. Repeat __________ times. Complete this exercise __________ times per day.  STRETCHING - Hip Flexors, Lunge  Half kneel with your right / left knee on the floor and your opposite knee bent and directly over your ankle.  Keep good posture with your head over your shoulders. Tighten your buttocks to point your tailbone downward; this will prevent your back from arching too much.  You should feel a gentle stretch in the front of your thigh and/or hip. If you do not feel any resistance, slightly slide your opposite foot forward and  then slowly lunge forward so your knee once again lines up over your ankle. Be sure your tailbone remains pointed downward.  Hold this stretch for __________ seconds. Repeat __________ times. Complete this stretch __________ times per day. STRENGTHENING EXERCISES These exercises may help you when beginning to rehabilitate your injury. They may resolve your symptoms with or without further  involvement from your physician, physical therapist or athletic trainer. While completing these exercises, remember:   Muscles can gain both the endurance and the strength needed for everyday activities through controlled exercises.  Complete these exercises as instructed by your physician, physical therapist or athletic trainer. Progress the resistance and repetitions only as guided.  You may experience muscle soreness or fatigue, but the pain or discomfort you are trying to eliminate should never worsen during these exercises. If this pain does worsen, stop and make certain you are following the directions exactly. If the pain is still present after adjustments, discontinue the exercise until you can discuss the trouble with your clinician. STRENGTH - Hip Extensors, Bridge   Lie on your back on a firm surface. Bend your knees and place your feet flat on the floor.  Tighten your buttocks muscles and lift your bottom off the floor until your trunk is level with your thighs. You should feel the muscles in your buttocks and back of your thighs working. If you do not feel these muscles, slide your feet 1-2 inches further away from your buttocks.  Hold this position for __________ seconds.  Slowly lower your hips to the starting position and allow your buttock muscles relax completely before beginning the next repetition.  If this exercise is too easy, you may cross your arms over your chest. Repeat __________ times. Complete this exercise __________ times per day.  STRENGTH - Hip Abductors, Straight Leg Raises  Be aware of your form throughout the entire exercise so that you exercise the correct muscles. Sloppy form means that you are not strengthening the correct muscles.  Lie on your side so that your head, shoulders, knee and hip line up. You may bend your lower knee to help maintain your balance. Your right / left leg should be on top.  Roll your hips slightly forward, so that your hips are  stacked directly over each other and your right / left knee is facing forward.  Lift your top leg up 4-6 inches, leading with your heel. Be sure that your foot does not drift forward or that your knee does not roll toward the ceiling.  Hold this position for __________ seconds. You should feel the muscles in your outer hip lifting (you may not notice this until your leg begins to tire).  Slowly lower your leg to the starting position. Allow the muscles to fully relax before beginning the next repetition. Repeat __________ times. Complete this exercise __________ times per day.  STRENGTH - Hip Adductors, Straight Leg Raises   Lie on your side so that your head, shoulders, knee and hip line up. You may place your upper foot in front to help maintain your balance. Your right / left leg should be on the bottom.  Roll your hips slightly forward, so that your hips are stacked directly over each other and your right / left knee is facing forward.  Tense the muscles in your inner thigh and lift your bottom leg 4-6 inches. Hold this position for __________ seconds.  Slowly lower your leg to the starting position. Allow the muscles to fully relax before beginning  the next repetition. Repeat __________ times. Complete this exercise __________ times per day.  STRENGTH - Quadriceps, Straight Leg Raises  Quality counts! Watch for signs that the quadriceps muscle is working to insure you are strengthening the correct muscles and not "cheating" by substituting with healthier muscles.  Lay on your back with your right / left leg extended and your opposite knee bent.  Tense the muscles in the front of your right / left thigh. You should see either your knee cap slide up or increased dimpling just above the knee. Your thigh may even quiver.  Tighten these muscles even more and raise your leg 4 to 6 inches off the floor. Hold for right / left seconds.  Keeping these muscles tense, lower your leg.  Relax the  muscles slowly and completely in between each repetition. Repeat __________ times. Complete this exercise __________ times per day.  STRENGTH - Hip Abductors, Standing  Tie one end of a rubber exercise band/tubing to a secure surface (table, pole) and tie a loop at the other end.  Place the loop around your right / left ankle. Keeping your ankle with the band directly opposite of the secured end, step away until there is tension in the tube/band.  Hold onto a chair as needed for balance.  Keeping your back upright, your shoulders over your hips, and your toes pointing forward, lift your right / left leg out to your side. Be sure to lift your leg with your hip muscles. Do not "throw" your leg or tip your body to lift your leg.  Slowly and with control, return to the starting position. Repeat exercise __________ times. Complete this exercise __________ times per day.  STRENGTH - Quadriceps, Squats  Stand in a door frame so that your feet and knees are in line with the frame.  Use your hands for balance, not support, on the frame.  Slowly lower your weight, bending at the hips and knees. Keep your lower legs upright so that they are parallel with the door frame. Squat only within the range that does not increase your knee pain. Never let your hips drop below your knees.  Slowly return upright, pushing with your legs, not pulling with your hands.   This information is not intended to replace advice given to you by your health care provider. Make sure you discuss any questions you have with your health care provider.   Document Released: 12/18/2005 Document Revised: 12/21/2014 Document Reviewed: 03/14/2009 Elsevier Interactive Patient Education [employer]2016 [employer] Inc.

## 2016-03-17 NOTE — Progress Notes (Signed)
Pre visit review using our clinic review tool, if applicable. No additional management support is needed unless otherwise documented below in the visit note. 

## 2016-03-17 NOTE — Assessment & Plan Note (Signed)
Symptoms and exam consistent with hip contusion although cannot rule out possible bursitis. Treat conservatively with over-the-counter medications as needed for symptom relief and supportive care. Recommend ice and initiate home exercise therapy. If symptoms worsen or fail to improve consider additional therapy or possible imaging.

## 2016-03-17 NOTE — Progress Notes (Signed)
Subjective:    Patient ID: [name] [name], female    DOB: 01/02/1971, 45 y.o.   MRN: [id number]  Chief Complaint  Patient presents with  . Fall    had a fall in january, having trouble sleeping on her right side, does not hurt when walking, upper thigh and part of lower right side of back, did have a bruise but it went away    HPI:  [name] RackLashonda N Fortin is a 45 y.o. female who  has a past medical history of Chronic headache; Anemia, iron deficiency; HTN (hypertension); GERD (gastroesophageal reflux disease); Elevated glucose (2011); and Diabetes mellitus. and presents today for an office visit.  This is a new problem. Associated symptom of pain located in her right hip has been going on for about 4 months following a fall where she landed on her right side. Pain is primarily noted when laying on it but does not hurt with activity. Pain is described as a soreness. Modifying factors include Bengay, ice and ibuprofen which did help a little with her symptoms. Initially there was bruising that went away. Does have some radiating pain to her back.   Allergies  Allergen Reactions  . Ciprofloxacin     REACTION: gittery     Current Outpatient Prescriptions on File Prior to Visit  Medication Sig Dispense Refill  . bromocriptine (PARLODEL) 2.5 MG tablet Take 0.5 tablets (1.25 mg total) by mouth daily. 15 tablet 11  . Canagliflozin (INVOKANA) 300 MG TABS Take 1 tablet (300 mg total) by mouth daily. 30 tablet 11  . Cholecalciferol 1000 UNITS tablet Take 1,000 Units by mouth daily.      Marland Kitchen. dexlansoprazole (DEXILANT) 60 MG capsule Take 1 capsule (60 mg total) by mouth every morning. For indigestion (Patient taking differently: Take 60 mg by mouth daily as needed (acid reflux). For indigestion) 30 capsule 11  . fexofenadine (ALLEGRA) 180 MG tablet Take 1 tablet (180 mg total) by mouth daily. (Patient taking differently: Take 180 mg by mouth daily as needed for allergies. ) 30 tablet 1  . Lancets  (FREESTYLE) lancets Use two times daily as instructed. Dx: 250.00 100 each 6  . linagliptin (TRADJENTA) 5 MG TABS tablet Take 1 tablet (5 mg total) by mouth daily. 30 tablet 11  . Multiple Vitamins-Calcium (ONE-A-DAY WOMENS PO) Take 1 tablet by mouth daily.    Jeananne Rama. Tetrahydrozoline-Zn Sulfate (EYE DROPS ALLERGY RELIEF OP) Place 1 drop into both eyes daily as needed (allergies).    . [DISCONTINUED] zolpidem (AMBIEN) 10 MG tablet Take 0.5-1 tablets (5-10 mg total) by mouth at bedtime as needed for sleep. 30 tablet 3   No current facility-administered medications on file prior to visit.     Past Surgical History  Procedure Laterality Date  . Partial hysterectomy  2010  . Nose surgery    . Breast reduction surgery    . Cyst removed from vagina    . Induced abortion  1990-1994    x 4  . Tubal ligation    . Abdominal hysterectomy      Past Medical History  Diagnosis Date  . Chronic headache     Dr Vela ProseLewitt, Neg MRI 2005  . Anemia, iron deficiency   . HTN (hypertension)   . GERD (gastroesophageal reflux disease)   . Elevated glucose 2011  . Diabetes mellitus      Review of Systems  Constitutional: Negative for fever and chills.  Musculoskeletal:       Positive for right hip  pain.  Neurological: Negative for weakness and numbness.      Objective:    BP 118/82 mmHg  Pulse 71  Temp(Src) 97.5 F (36.4 C) (Oral)  Resp 16  Ht  (1.626 m)  Wt 232 lb (105.235 kg)  BMI 39.80 kg/m2  SpO2 99% Nursing note and vital signs reviewed.  Physical Exam  Constitutional: She is oriented to person, place, and time. She appears well-developed and well-nourished. No distress.  Cardiovascular: Normal rate, regular rhythm, normal heart sounds and intact distal pulses.   Pulmonary/Chest: Effort normal and breath sounds normal.  Musculoskeletal:  Right hip - no obvious deformity, discoloration, or edema noted. Mild palpable tenderness along greater trochanter and slightly distal. Range of  motion is within normal limits and strength is 5+. Distal pulses and sensation are intact and appropriate.  Neurological: She is alert and oriented to person, place, and time.  Skin: Skin is warm and dry.  Psychiatric: She has a normal mood and affect. Her behavior is normal. Judgment and thought content normal.       Assessment & Plan:   Problem List Items Addressed This Visit      Other   Contusion, hip - Primary    Symptoms and exam consistent with hip contusion although cannot rule out possible bursitis. Treat conservatively with over-the-counter medications as needed for symptom relief and supportive care. Recommend ice and initiate home exercise therapy. If symptoms worsen or fail to improve consider additional therapy or possible imaging.          I have discontinued Ms. Leete's gabapentin, butalbital-acetaminophen-caffeine, glucose blood, and ketoconazole. I am also having her start on glucose blood. Additionally, I am having her maintain her Cholecalciferol, dexlansoprazole, freestyle, fexofenadine, canagliflozin, Multiple Vitamins-Calcium (ONE-A-DAY WOMENS PO), Tetrahydrozoline-Zn Sulfate (EYE DROPS ALLERGY RELIEF OP), linagliptin, bromocriptine, metFORMIN, and Olmesartan-Amlodipine-HCTZ.   Meds ordered this encounter  Medications  . metFORMIN (GLUCOPHAGE) 500 MG tablet    Sig: Take 2 tablets (1,000 mg total) by mouth 2 (two) times daily with a meal.    Dispense:  120 tablet    Refill:  1  . DISCONTD: Olmesartan-Amlodipine-HCTZ (TRIBENZOR) 40-10-25 MG TABS    Sig: Take 1 tablet by mouth 1 day or 1 dose.    Dispense:  30 tablet    Refill:  11  . glucose blood (ONETOUCH VERIO) test strip    Sig: Use as instructed    Dispense:  100 each    Refill:  12    Dx E11.9    Order Specific Question:  Supervising Provider    Answer:  Hillard Danker A [[id number]]  . Olmesartan-Amlodipine-HCTZ (TRIBENZOR) 40-10-25 MG TABS    Sig: Take 1 tablet by mouth 1 day or 1 dose.     Dispense:  30 tablet    Refill:  11    Order Specific Question:  Supervising Provider    Answer:  Hillard Danker A [[id number]]     Follow-up: Return if symptoms worsen or fail to improve.  Jeanine Luz, FNP

## 2016-05-06 ENCOUNTER — Encounter: Payer: Self-pay | Admitting: Endocrinology

## 2016-05-06 ENCOUNTER — Ambulatory Visit (INDEPENDENT_AMBULATORY_CARE_PROVIDER_SITE_OTHER): Payer: 59 | Admitting: Endocrinology

## 2016-05-06 ENCOUNTER — Encounter: Payer: 59 | Attending: Endocrinology | Admitting: Nutrition

## 2016-05-06 VITALS — BP 116/60 | HR 88 | Temp 98.1°F | Ht 64.0 in | Wt 232.0 lb

## 2016-05-06 DIAGNOSIS — E119 Type 2 diabetes mellitus without complications: Secondary | ICD-10-CM

## 2016-05-06 LAB — POCT GLYCOSYLATED HEMOGLOBIN (HGB A1C): Hemoglobin A1C: 7.9

## 2016-05-06 MED ORDER — REPAGLINIDE 1 MG PO TABS: 1.0000 mg | ORAL_TABLET | Freq: Two times a day (BID) | ORAL | Status: DC

## 2016-05-06 NOTE — Patient Instructions (Signed)
Watch portion sizes of carbohydrate and fats in each meal Add protein to all meals Stop sweet drinks, candy, fruit juices and cold cereal and milk Read over information on GlP1 drug and call if questions.

## 2016-05-06 NOTE — Patient Instructions (Addendum)
check your blood sugar once a day.  vary the time of day when you check, between before the 3 meals, and at bedtime.  also check if you have symptoms of your blood sugar being too high or too low.  please keep a record of the readings and bring it to your next appointment here.  You can write it on any piece of paper.  please call us sooner if your blood sugar goes below 70, or if you have a lot of readings over 200. i have sent a prescription to your pharmacy, to add "repaglinide," with breakfast and supper.   Please see Bonita QuinLinda, to learn about insulin.  Please come back for a follow-up appointment in 4 months.

## 2016-05-06 NOTE — Progress Notes (Signed)
Discussed the pathophysiology of diabetes, and the possible need for insulin.   Discussed ways she can make her insulin work better--exercise, wt. Loss, balanced meals.   Also discussed how each of her diabetes medications work to help her control her blood sugar levels. Diet is high is sugar--sweet drinks, candy,  No exercise, and not watching portion sizes Plan: She was shown how to take insulin--how to dial a dose, where to injection, how to attach a needle, how to store the insulin, and also how to take the GLP 1 that Dr. Everardo AllEllison discussed with her   We talked about how this will help to control her blood sugars . She said she was given a Surveyor, miningVerio meter, but can not understand how the lancet device works.  She was shown this, and we discussed the need to test ac and 2hr. pc one meal to determine if her body is making enough insulin to bring her blood sugars down after eating:  Goal for ac: less than 120,, and 2hr. Pc: less than 170.  She reported good understanding of this and had no final questions. Information given to her on the class of GLP1 drugs.

## 2016-05-06 NOTE — Progress Notes (Signed)
Subjective:    Patient ID: [name] [name], female    DOB: 03/20/1971, 45 y.o.   MRN: [id number]  HPI Pt returns for f/u of diabetes mellitus: DM type: 2 Dx'ed: 2011 Complications: nephropathy Therapy: 4 oral meds.   GDM: never DKA: never Severe hypoglycemia: never Pancreatitis: never Other: She declines weight-loss surgery; she has never taken insulin.  Interval history:  no cbg record, but states cbg's are well-controlled.  pt states she feels well in general.  She says she never misses the insulin.  Pt has small multinodular goiter (dx'ed 2015; US showed the nodules did not merit bx). she does notice the goiter.   Past Medical History  Diagnosis Date  . Chronic headache     Dr Vela ProseLewitt, Neg MRI 2005  . Anemia, iron deficiency   . HTN (hypertension)   . GERD (gastroesophageal reflux disease)   . Elevated glucose 2011  . Diabetes mellitus     Past Surgical History  Procedure Laterality Date  . Partial hysterectomy  2010  . Nose surgery    . Breast reduction surgery    . Cyst removed from vagina    . Induced abortion  1990-1994    x 4  . Tubal ligation    . Abdominal hysterectomy      Social History   Social History  . Marital Status: Single    Spouse Name: N/A  . Number of Children: 2  . Years of Education: N/A   Occupational History  . Not on file.   Social History Main Topics  . Smoking status: Never Smoker   . Smokeless tobacco: Never Used  . Alcohol Use: 0.0 oz/week    0 Standard drinks or equivalent per week     Comment: 2 wine or mixed drinks on a weekend  . Drug Use: No  . Sexual Activity: Not on file   Other Topics Concern  . Not on file   Social History Narrative   Regular exercise - YES    Current Outpatient Prescriptions on File Prior to Visit  Medication Sig Dispense Refill  . bromocriptine (PARLODEL) 2.5 MG tablet Take 0.5 tablets (1.25 mg total) by mouth daily. 15 tablet 11  . Canagliflozin (INVOKANA) 300 MG TABS Take 1 tablet (300  mg total) by mouth daily. 30 tablet 11  . Cholecalciferol 1000 UNITS tablet Take 1,000 Units by mouth daily.      Marland Kitchen. dexlansoprazole (DEXILANT) 60 MG capsule Take 1 capsule (60 mg total) by mouth every morning. For indigestion (Patient taking differently: Take 60 mg by mouth daily as needed (acid reflux). For indigestion) 30 capsule 11  . fexofenadine (ALLEGRA) 180 MG tablet Take 1 tablet (180 mg total) by mouth daily. (Patient taking differently: Take 180 mg by mouth daily as needed for allergies. ) 30 tablet 1  . glucose blood (ONETOUCH VERIO) test strip Use as instructed 100 each 12  . Lancets (FREESTYLE) lancets Use two times daily as instructed. Dx: 250.00 100 each 6  . linagliptin (TRADJENTA) 5 MG TABS tablet Take 1 tablet (5 mg total) by mouth daily. 30 tablet 11  . metFORMIN (GLUCOPHAGE) 500 MG tablet Take 2 tablets (1,000 mg total) by mouth 2 (two) times daily with a meal. 120 tablet 1  . Multiple Vitamins-Calcium (ONE-A-DAY WOMENS PO) Take 1 tablet by mouth daily.    . Olmesartan-Amlodipine-HCTZ (TRIBENZOR) 40-10-25 MG TABS Take 1 tablet by mouth 1 day or 1 dose. 30 tablet 11  . Tetrahydrozoline-Zn Sulfate (EYE DROPS  ALLERGY RELIEF OP) Place 1 drop into both eyes daily as needed (allergies).    . [DISCONTINUED] zolpidem (AMBIEN) 10 MG tablet Take 0.5-1 tablets (5-10 mg total) by mouth at bedtime as needed for sleep. 30 tablet 3   No current facility-administered medications on file prior to visit.    Allergies  Allergen Reactions  . Ciprofloxacin     REACTION: gittery    Family History  Problem Relation Age of Onset  . Hypertension Mother   . Cancer Mother 62    Esophageal  . Hypertension Maternal Grandmother     BP 116/60 mmHg  Pulse 88  Temp(Src) 98.1 F (36.7 C) (Oral)  Ht  (1.626 m)  Wt 232 lb (105.235 kg)  BMI 39.80 kg/m2  SpO2 98%  Review of Systems She denies hypoglycemia    Objective:   Physical Exam VITAL SIGNS:  See vs page GENERAL: no  distress Pulses: dorsalis pedis intact bilat.   MSK: no deformity of the feet CV: no leg edema Skin:  no ulcer on the feet.  normal color and temp on the feet. Neuro: sensation is intact to touch on the feet   A1c=7.9%    Assessment & Plan:  DM: she needs increased rx   Patient is advised the following: Patient Instructions  check your blood sugar once a day.  vary the time of day when you check, between before the 3 meals, and at bedtime.  also check if you have symptoms of your blood sugar being too high or too low.  please keep a record of the readings and bring it to your next appointment here.  You can write it on any piece of paper.  please call us sooner if your blood sugar goes below 70, or if you have a lot of readings over 200. i have sent a prescription to your pharmacy, to add "repaglinide," with breakfast and supper.   Please see Bonita Quin, to learn about insulin.  Please come back for a follow-up appointment in 4 months.

## 2016-07-28 ENCOUNTER — Ambulatory Visit (INDEPENDENT_AMBULATORY_CARE_PROVIDER_SITE_OTHER): Payer: 59 | Admitting: Internal Medicine

## 2016-07-28 ENCOUNTER — Encounter: Payer: Self-pay | Admitting: Internal Medicine

## 2016-07-28 DIAGNOSIS — J3089 Other allergic rhinitis: Secondary | ICD-10-CM | POA: Diagnosis not present

## 2016-07-28 DIAGNOSIS — J069 Acute upper respiratory infection, unspecified: Secondary | ICD-10-CM | POA: Diagnosis not present

## 2016-07-28 DIAGNOSIS — I10 Essential (primary) hypertension: Secondary | ICD-10-CM | POA: Diagnosis not present

## 2016-07-28 MED ORDER — AZITHROMYCIN 250 MG PO TABS: ORAL_TABLET | ORAL | 0 refills | Status: DC

## 2016-07-28 MED ORDER — METFORMIN HCL 500 MG PO TABS: 1000.0000 mg | ORAL_TABLET | Freq: Two times a day (BID) | ORAL | 11 refills | Status: DC

## 2016-07-28 MED ORDER — OLMESARTAN-AMLODIPINE-HCTZ 40-10-25 MG PO TABS: 1.0000 | ORAL_TABLET | ORAL | 11 refills | Status: DC

## 2016-07-28 MED ORDER — CETIRIZINE HCL 10 MG PO TABS: 10.0000 mg | ORAL_TABLET | Freq: Every day | ORAL | 11 refills | Status: DC

## 2016-07-28 NOTE — Addendum Note (Signed)
Addended by: Tresa GarterPLOTNIKOV, Avenell Sellers V on: 07/28/2016 08:26 AM   Modules accepted: Orders

## 2016-07-28 NOTE — Assessment & Plan Note (Signed)
Zyrtec, Flonase 

## 2016-07-28 NOTE — Assessment & Plan Note (Signed)
Benicar 

## 2016-07-28 NOTE — Progress Notes (Signed)
Pre visit review using our clinic review tool, if applicable. No additional management support is needed unless otherwise documented below in the visit note. 

## 2016-07-28 NOTE — Progress Notes (Signed)
Subjective:  Patient ID: [name] [name], female    DOB: 01/30/1971  Age: 45 y.o. MRN: [id number]  CC: Cough (x 5 days. Taking OTC Tussin with some relief); Sinusitis (x 6 days. Taking zyrtec); and Ear Pain (feel stopped up, Right worse than left)   HPI [name] [name] presents for cough, ST, itching in the ears x 3-4 days F/u HTN, allergies  Outpatient Medications Prior to Visit  Medication Sig Dispense Refill  . bromocriptine (PARLODEL) 2.5 MG tablet Take 0.5 tablets (1.25 mg total) by mouth daily. 15 tablet 11  . Canagliflozin (INVOKANA) 300 MG TABS Take 1 tablet (300 mg total) by mouth daily. 30 tablet 11  . Cholecalciferol 1000 UNITS tablet Take 1,000 Units by mouth daily.      Marland Kitchen. dexlansoprazole (DEXILANT) 60 MG capsule Take 1 capsule (60 mg total) by mouth every morning. For indigestion (Patient taking differently: Take 60 mg by mouth daily as needed (acid reflux). For indigestion) 30 capsule 11  . fexofenadine (ALLEGRA) 180 MG tablet Take 1 tablet (180 mg total) by mouth daily. (Patient taking differently: Take 180 mg by mouth daily as needed for allergies. ) 30 tablet 1  . glucose blood (ONETOUCH VERIO) test strip Use as instructed 100 each 12  . Lancets (FREESTYLE) lancets Use two times daily as instructed. Dx: 250.00 100 each 6  . linagliptin (TRADJENTA) 5 MG TABS tablet Take 1 tablet (5 mg total) by mouth daily. 30 tablet 11  . metFORMIN (GLUCOPHAGE) 500 MG tablet Take 2 tablets (1,000 mg total) by mouth 2 (two) times daily with a meal. 120 tablet 1  . Multiple Vitamins-Calcium (ONE-A-DAY WOMENS PO) Take 1 tablet by mouth daily.    . Olmesartan-Amlodipine-HCTZ (TRIBENZOR) 40-10-25 MG TABS Take 1 tablet by mouth 1 day or 1 dose. 30 tablet 11  . repaglinide (PRANDIN) 1 MG tablet Take 1 tablet (1 mg total) by mouth 2 (two) times daily before a meal. 60 tablet 11  . Tetrahydrozoline-Zn Sulfate (EYE DROPS ALLERGY RELIEF OP) Place 1 drop into both eyes daily as needed (allergies).       No facility-administered medications prior to visit.     ROS Review of Systems  Constitutional: Negative for activity change, appetite change, chills, fatigue and unexpected weight change.  HENT: Positive for ear pain, rhinorrhea and sore throat. Negative for congestion, mouth sores and sinus pressure.   Eyes: Negative for visual disturbance.  Respiratory: Positive for cough. Negative for chest tightness.   Gastrointestinal: Negative for abdominal pain and nausea.  Genitourinary: Negative for difficulty urinating, frequency and vaginal pain.  Musculoskeletal: Negative for back pain and gait problem.  Skin: Negative for pallor and rash.  Neurological: Negative for dizziness, tremors, weakness, numbness and headaches.  Psychiatric/Behavioral: Negative for confusion and sleep disturbance.    Objective:  BP 130/82   Pulse 87   Temp 98 F (36.7 C) (Oral)   Wt 229 lb (103.9 kg)   SpO2 99%   BMI 39.31 kg/m   BP Readings from Last 3 Encounters:  07/28/16 130/82  05/06/16 116/60  03/17/16 118/82    Wt Readings from Last 3 Encounters:  07/28/16 229 lb (103.9 kg)  05/06/16 232 lb (105.2 kg)  03/17/16 232 lb (105.2 kg)    Physical Exam  Constitutional: She appears well-developed. No distress.  HENT:  Head: Normocephalic.  Right Ear: External ear normal.  Left Ear: External ear normal.  Nose: Nose normal.  Mouth/Throat: Oropharynx is clear and moist.  Eyes: Conjunctivae  are normal. Pupils are equal, round, and reactive to light. Right eye exhibits no discharge. Left eye exhibits no discharge.  Neck: Normal range of motion. Neck supple. No JVD present. No tracheal deviation present. No thyromegaly present.  Cardiovascular: Normal rate, regular rhythm and normal heart sounds.   Pulmonary/Chest: No stridor. No respiratory distress. She has no wheezes.  Abdominal: Soft. Bowel sounds are normal. She exhibits no distension and no mass. There is no tenderness. There is no rebound  and no guarding.  Musculoskeletal: She exhibits no edema or tenderness.  Lymphadenopathy:    She has no cervical adenopathy.  Neurological: She displays normal reflexes. No cranial nerve deficit. She exhibits normal muscle tone. Coordination normal.  Skin: No rash noted. No erythema.  Psychiatric: She has a normal mood and affect. Her behavior is normal. Judgment and thought content normal.  L TM red eryth throat  Lab Results  Component Value Date   WBC 11.0 (H) 12/21/2014   HGB 13.5 12/21/2014   HCT 39.1 12/21/2014   PLT 267 12/21/2014   GLUCOSE 170 (H) 12/21/2014   CHOL 189 07/21/2012   TRIG 67.0 07/21/2012   HDL 54.00 07/21/2012   LDLDIRECT 137.4 11/04/2010   LDLCALC 122 (H) 07/21/2012   ALT 19 12/21/2014   AST 18 12/21/2014   NA 133 (L) 12/21/2014   K 3.6 12/21/2014   CL 94 (L) 12/21/2014   CREATININE 0.82 12/21/2014   BUN 10 12/21/2014   CO2 25 12/21/2014   TSH 1.15 01/18/2015   HGBA1C 7.9 05/06/2016   MICROALBUR 53.0 (H) 01/18/2015    [place] Soft Tissue Head/neck  Result Date: 03/05/2015 CLINICAL DATA:  Goiter, neck pain x1 week EXAM: THYROID ULTRASOUND TECHNIQUE: Ultrasound examination of the thyroid gland and adjacent soft tissues was performed. COMPARISON:  03/09/2014 and earlier studies FINDINGS: Right thyroid lobe Measurements: 53 x 16 x 21 mm. Multiple simple and complex cysts. Largest 11 mm, mid lobe (previously 12 mm). Remainder less than 1 cm. Left thyroid lobe Measurements: 55 x 16 x 23 mm. Multiple complex mostly cystic lesions. Largest 13 x 6 x 18 mm, inferior pole (previously 13 x 8 x 13). Remainder less than 1 cm. Isthmus Thickness: 5.4 mm.  No nodules. Lymphadenopathy None visualized. IMPRESSION: 1. Thyromegaly with multiple bilateral complex cysts. Findings do not meet current consensus criteria for biopsy. Follow-up by clinical exam is recommended. If patient has known risk factors for thyroid carcinoma, consider follow-up ultrasound in 12 months. If patient is  clinically hyperthyroid, consider nuclear medicine thyroid uptake and scan. This recommendation follows the consensus statement: Management of Thyroid Nodules Detected as US: Society of Radiologists in Ultrasound Consensus Conference Statement. Radiology 2005; [id number]. Electronically Signed   By: Corlis Leak  Hassell M.D.   On: 03/05/2015 16:00    Assessment & Plan:   There are no diagnoses linked to this encounter. I am having Ms. Haecker maintain her Cholecalciferol, dexlansoprazole, freestyle, fexofenadine, canagliflozin, Multiple Vitamins-Calcium (ONE-A-DAY WOMENS PO), Tetrahydrozoline-Zn Sulfate (EYE DROPS ALLERGY RELIEF OP), linagliptin, bromocriptine, metFORMIN, glucose blood, Olmesartan-Amlodipine-HCTZ, and repaglinide.  No orders of the defined types were placed in this encounter.    Follow-up: No Follow-up on file.  Sonda PrimesAlex Plotnikov, MD

## 2016-07-28 NOTE — Patient Instructions (Addendum)
Use over-the-counter  "cold" medicines  such as  "Afrin" nasal spray for nasal congestion as directed instead. Use" Delsym" or" Robitussin" cough syrup varietis for cough.  You can use plain "Tylenol" or "Advil" for fever, chills and achyness. Use Halls or Ricola cough drops.   "Common cold" symptoms are usually triggered by a virus.  The antibiotics are usually not necessary. On average, a" viral cold" illness would take 4-7 days to resolve. Please, take a Zpac if worse

## 2016-07-28 NOTE — Assessment & Plan Note (Signed)
L OM Zpac

## 2016-08-03 ENCOUNTER — Encounter (HOSPITAL_COMMUNITY): Payer: Self-pay | Admitting: Family Medicine

## 2016-08-03 ENCOUNTER — Ambulatory Visit (HOSPITAL_COMMUNITY)
Admission: EM | Admit: 2016-08-03 | Discharge: 2016-08-03 | Disposition: A | Discharge status: Home / Self Care | Payer: 59 | Attending: Family Medicine | Admitting: Family Medicine

## 2016-08-03 DIAGNOSIS — I889 Nonspecific lymphadenitis, unspecified: Secondary | ICD-10-CM | POA: Insufficient documentation

## 2016-08-03 DIAGNOSIS — Z7984 Long term (current) use of oral hypoglycemic drugs: Secondary | ICD-10-CM | POA: Insufficient documentation

## 2016-08-03 DIAGNOSIS — D509 Iron deficiency anemia, unspecified: Secondary | ICD-10-CM | POA: Diagnosis not present

## 2016-08-03 DIAGNOSIS — I10 Essential (primary) hypertension: Secondary | ICD-10-CM | POA: Insufficient documentation

## 2016-08-03 DIAGNOSIS — E119 Type 2 diabetes mellitus without complications: Secondary | ICD-10-CM | POA: Insufficient documentation

## 2016-08-03 DIAGNOSIS — G4700 Insomnia, unspecified: Secondary | ICD-10-CM | POA: Insufficient documentation

## 2016-08-03 DIAGNOSIS — J029 Acute pharyngitis, unspecified: Secondary | ICD-10-CM | POA: Diagnosis present

## 2016-08-03 DIAGNOSIS — Z881 Allergy status to other antibiotic agents status: Secondary | ICD-10-CM | POA: Diagnosis not present

## 2016-08-03 DIAGNOSIS — Z79899 Other long term (current) drug therapy: Secondary | ICD-10-CM | POA: Diagnosis not present

## 2016-08-03 DIAGNOSIS — E559 Vitamin D deficiency, unspecified: Secondary | ICD-10-CM | POA: Diagnosis not present

## 2016-08-03 DIAGNOSIS — E669 Obesity, unspecified: Secondary | ICD-10-CM | POA: Insufficient documentation

## 2016-08-03 DIAGNOSIS — K219 Gastro-esophageal reflux disease without esophagitis: Secondary | ICD-10-CM | POA: Diagnosis not present

## 2016-08-03 DIAGNOSIS — Z8249 Family history of ischemic heart disease and other diseases of the circulatory system: Secondary | ICD-10-CM | POA: Insufficient documentation

## 2016-08-03 LAB — POCT RAPID STREP A: STREPTOCOCCUS, GROUP A SCREEN (DIRECT): NEGATIVE

## 2016-08-03 MED ORDER — FLUCONAZOLE 150 MG PO TABS: 150.0000 mg | ORAL_TABLET | Freq: Once | ORAL | 0 refills | Status: AC

## 2016-08-03 MED ORDER — CHLORHEXIDINE GLUCONATE 0.12 % MT SOLN: 15.0000 mL | Freq: Two times a day (BID) | OROMUCOSAL | 0 refills | Status: DC

## 2016-08-03 MED ORDER — AMOXICILLIN 500 MG PO CAPS: 500.0000 mg | ORAL_CAPSULE | Freq: Three times a day (TID) | ORAL | 0 refills | Status: DC

## 2016-08-03 NOTE — ED Provider Notes (Signed)
[facility]    CSN: [id number] Arrival date & time: 08/03/16  1418  First Provider Contact:  None       History   Chief Complaint Chief Complaint  Patient presents with  . Sore Throat    HPI [name] [name] is a 45 y.o. female.   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 She's been treated with Afrin, Flonase, and a Z-Pak without benefit. She's not had any systemic symptoms and no fever. Nevertheless her glands and left side of her neck have been swollen and tender. She also has pain when she swallows.  Patient denies fever, cough. She also denies ear pain at this time.  Family history: Positive for esophageal cancer (mother)      Past Medical History:  Diagnosis Date  . Anemia, iron deficiency   . Chronic headache    Dr Vela ProseLewitt, Neg MRI 2005  . Diabetes mellitus   . Elevated glucose 2011  . GERD (gastroesophageal reflux disease)   . HTN (hypertension)     Patient Active Problem List   Diagnosis Date Noted  . Hypertension, uncontrolled 12/21/2014  . Goiter 03/07/2014  . LBP (low back pain) 10/13/2012  . Herpes zoster 10/13/2012  . Unspecified vitamin D deficiency 03/08/2011  . Diabetes type 2, controlled (HCC) 03/06/2011  . INSOMNIA, CHRONIC 07/28/2010  . OBESITY 06/24/2009  . GERD 03/11/2009  . ANEMIA-IRON DEFICIENCY 10/19/2007  . Essential hypertension 10/19/2007    Past Surgical History:  Procedure Laterality Date  . ABDOMINAL HYSTERECTOMY    . BREAST REDUCTION SURGERY    . Cyst removed from vagina    . INDUCED ABORTION  1990-1994   x 4  . NOSE SURGERY    . PARTIAL HYSTERECTOMY  2010  . TUBAL LIGATION      OB History    No data available       Home Medications    Prior to Admission medications   Medication Sig Start Date End Date Taking? Authorizing Provider  metFORMIN (GLUCOPHAGE) 500 MG tablet Take 2 tablets (1,000 mg total) by mouth 2 (two) times daily with a meal. 07/28/16  Yes Tresa GarterAleksei V  Plotnikov, MD  Multiple Vitamins-Calcium (ONE-A-DAY WOMENS PO) Take 1 tablet by mouth daily.   Yes Historical Provider, MD  Olmesartan-Amlodipine-HCTZ (TRIBENZOR) 40-10-25 MG TABS Take 1 tablet by mouth 1 day or 1 dose. 07/28/16  Yes Georgina QuintAleksei V Plotnikov, MD  amoxicillin (AMOXIL) 500 MG capsule Take 1 capsule (500 mg total) by mouth 3 (three) times daily. 08/03/16   Elvina SidleKurt Davie Claud, MD  bromocriptine (PARLODEL) 2.5 MG tablet Take 0.5 tablets (1.25 mg total) by mouth daily. 08/21/15   Romero BellingSean Ellison, MD  Canagliflozin (INVOKANA) 300 MG TABS Take 1 tablet (300 mg total) by mouth daily. 03/07/14   Romero BellingSean Ellison, MD  cetirizine (ZYRTEC) 10 MG tablet Take 1 tablet (10 mg total) by mouth daily. 07/28/16   Georgina QuintAleksei V Plotnikov, MD  chlorhexidine (PERIDEX) 0.12 % solution Use as directed 15 mLs in the mouth or throat 2 (two) times daily. 08/03/16   Elvina SidleKurt Oluwatimileyin Vivier, MD  Cholecalciferol 1000 UNITS tablet Take 1,000 Units by mouth daily.      Historical Provider, MD  dexlansoprazole (DEXILANT) 60 MG capsule Take 1 capsule (60 mg total) by mouth every morning. For indigestion Patient taking differently: Take 60 mg by mouth daily as needed (acid reflux). For indigestion 01/30/13   Tresa GarterAleksei V Plotnikov, MD  fluconazole (DIFLUCAN) 150 MG tablet Take 1 tablet (  150 mg total) by mouth once. Repeat if needed 08/03/16 08/03/16  Elvina Sidle, MD  glucose blood [facility] VERIO) test strip Use as instructed 03/17/16   Veryl Speak, FNP  Lancets (FREESTYLE) lancets Use two times daily as instructed. Dx: 250.00 03/30/13   Tresa Garter, MD  linagliptin (TRADJENTA) 5 MG TABS tablet Take 1 tablet (5 mg total) by mouth daily. 02/25/15   Romero Belling, MD  repaglinide (PRANDIN) 1 MG tablet Take 1 tablet (1 mg total) by mouth 2 (two) times daily before a meal. 05/06/16   Romero Belling, MD  Tetrahydrozoline-Zn Sulfate (EYE DROPS ALLERGY RELIEF OP) Place 1 drop into both eyes daily as needed (allergies).    Historical Provider, MD     Family History Family History  Problem Relation Age of Onset  . Hypertension Mother   . Cancer Mother 27    Esophageal  . Hypertension Maternal Grandmother     Social History Social History  Substance Use Topics  . Smoking status: Never Smoker  . Smokeless tobacco: Never Used  . Alcohol use 0.0 oz/week     Comment: 2 wine or mixed drinks on a weekend     Allergies   Ciprofloxacin   Review of Systems Review of Systems  Constitutional: Negative.   HENT: Positive for facial swelling and sore throat. Negative for congestion, ear discharge, ear pain, mouth sores, nosebleeds, rhinorrhea and sinus pressure.   Eyes: Negative.   Respiratory: Negative.   Cardiovascular: Negative.   Gastrointestinal: Negative.   Musculoskeletal: Negative.      Physical Exam Triage Vital Signs ED Triage Vitals  Enc Vitals Group     BP      Pulse      Resp      Temp      Temp src      SpO2      Weight      Height      Head Circumference      Peak Flow      Pain Score      Pain Loc      Pain Edu?      Excl. in GC?    No data found.   Updated Vital Signs BP 159/97 (BP Location: Right Arm)   Pulse 79   Temp 98.2 F (36.8 C) (Oral)   Resp 16   SpO2 100%       Physical Exam  Constitutional: She appears well-developed and well-nourished.  HENT:  Head: Normocephalic.  Right Ear: External ear normal.  Left Ear: External ear normal.  Mouth/Throat: Oropharynx is clear and moist.  Eyes: Conjunctivae are normal. Pupils are equal, round, and reactive to light.  Neck: Neck supple.  Cardiovascular: Normal rate.   Lymphadenopathy:    She has cervical adenopathy.  Nursing note and vitals reviewed.    UC Treatments / Results  Labs (all labs ordered are listed, but only abnormal results are displayed) Labs Reviewed  POCT RAPID STREP A    EKG  EKG Interpretation None       Radiology No results found.  Procedures Procedures (including critical care  time)  Medications Ordered in UC Medications - No data to display   Initial Impression / Assessment and Plan / UC Course  I have reviewed the triage vital signs and the nursing notes.  Pertinent labs & imaging results that were available during my care of the patient were reviewed by me and considered in my medical decision making (see chart for  details).  Clinical Course    Final Clinical Impressions(s) / UC Diagnoses   Final diagnoses:  Cervical adenitis    New Prescriptions New Prescriptions   AMOXICILLIN (AMOXIL) 500 MG CAPSULE    Take 1 capsule (500 mg total) by mouth 3 (three) times daily.   CHLORHEXIDINE (PERIDEX) 0.12 % SOLUTION    Use as directed 15 mLs in the mouth or throat 2 (two) times daily.   FLUCONAZOLE (DIFLUCAN) 150 MG TABLET    Take 1 tablet (150 mg total) by mouth once. Repeat if needed     Elvina SidleKurt Edeline Greening, MD 08/03/16 1527

## 2016-08-03 NOTE — ED Triage Notes (Signed)
Patient reports sore throat since Friday, no fever or cough

## 2016-08-03 NOTE — Discharge Instructions (Signed)
If you're not seeing improvement in the next 7 days please return or see her primary care doctor.

## 2016-08-06 LAB — CULTURE, GROUP A STREP (THRC)

## 2016-08-21 DIAGNOSIS — L299 Pruritus, unspecified: Secondary | ICD-10-CM | POA: Insufficient documentation

## 2016-09-07 ENCOUNTER — Ambulatory Visit: Payer: 59 | Admitting: Endocrinology

## 2016-10-22 ENCOUNTER — Encounter (HOSPITAL_COMMUNITY): Payer: Self-pay | Admitting: Emergency Medicine

## 2016-10-22 ENCOUNTER — Ambulatory Visit (HOSPITAL_COMMUNITY)
Admission: EM | Admit: 2016-10-22 | Discharge: 2016-10-22 | Disposition: A | Discharge status: Home / Self Care | Payer: 59 | Attending: Family Medicine | Admitting: Family Medicine

## 2016-10-22 DIAGNOSIS — J302 Other seasonal allergic rhinitis: Secondary | ICD-10-CM

## 2016-10-22 MED ORDER — CETIRIZINE HCL 10 MG PO TABS: 10.0000 mg | ORAL_TABLET | Freq: Every day | ORAL | 0 refills | Status: DC

## 2016-10-22 MED ORDER — FLUTICASONE PROPIONATE 50 MCG/ACT NA SUSP: 2.0000 | Freq: Every day | NASAL | 0 refills | Status: DC

## 2016-10-22 NOTE — Discharge Instructions (Signed)
Use over the counter saline in the nose as often as symptoms are occurring, and use flonase as directed AFTER the saline.   You should also take zyrtec daily for at least the next week or so, then as needed.   You are safe to fly tomorrow.

## 2016-10-22 NOTE — ED Triage Notes (Signed)
[facility] a [facility] with chest congestion. The patient reported that yesterday she had some nasal drainage and when she woke up today she had a cough with congestion and ear pain.

## 2016-10-22 NOTE — ED Provider Notes (Signed)
[facility]  CSN: [id number] Arrival date & time: 10/22/16  1219  History   Chief Complaint Chief Complaint  Patient presents with  . Cough   HPI [name] [name] is a 45 y.o. female presenting for cough, throat irritation.   Yesterday she noted sore and itchy throat, congestion and rhinorrhea developing gradually, now with occasional nonproductive cough. No fever, chills. Tried OTC tylenol cold and flu and coricidin without improvement.   Past Medical History:  Diagnosis Date  . Anemia, iron deficiency   . Chronic headache    Dr Vela ProseLewitt, Neg MRI 2005  . Diabetes mellitus   . Elevated glucose 2011  . GERD (gastroesophageal reflux disease)   . HTN (hypertension)     Patient Active Problem List   Diagnosis Date Noted  . Hypertension, uncontrolled 12/21/2014  . Goiter 03/07/2014  . LBP (low back pain) 10/13/2012  . Herpes zoster 10/13/2012  . Unspecified vitamin D deficiency 03/08/2011  . Diabetes type 2, controlled (HCC) 03/06/2011  . INSOMNIA, CHRONIC 07/28/2010  . OBESITY 06/24/2009  . GERD 03/11/2009  . ANEMIA-IRON DEFICIENCY 10/19/2007  . Essential hypertension 10/19/2007    Past Surgical History:  Procedure Laterality Date  . ABDOMINAL HYSTERECTOMY    . BREAST REDUCTION SURGERY    . Cyst removed from vagina    . INDUCED ABORTION  1990-1994   x 4  . NOSE SURGERY    . PARTIAL HYSTERECTOMY  2010  . TUBAL LIGATION      OB History    No data available       Home Medications    Prior to Admission medications   Medication Sig Start Date End Date Taking? Authorizing Provider  metFORMIN (GLUCOPHAGE) 500 MG tablet Take 2 tablets (1,000 mg total) by mouth 2 (two) times daily with a meal. 07/28/16  Yes Georgina QuintAleksei V Plotnikov, MD  Olmesartan-Amlodipine-HCTZ (TRIBENZOR) 40-10-25 MG TABS Take 1 tablet by mouth 1 day or 1 dose. 07/28/16  Yes Tresa GarterAleksei V Plotnikov, MD  bromocriptine (PARLODEL) 2.5 MG tablet Take 0.5 tablets (1.25 mg total) by mouth daily. 08/21/15    Romero BellingSean Ellison, MD  Canagliflozin (INVOKANA) 300 MG TABS Take 1 tablet (300 mg total) by mouth daily. 03/07/14   Romero BellingSean Ellison, MD  cetirizine (ZYRTEC) 10 MG tablet Take 1 tablet (10 mg total) by mouth daily. 10/22/16   Tyrone Nineyan B Young Brim, MD  chlorhexidine (PERIDEX) 0.12 % solution Use as directed 15 mLs in the mouth or throat 2 (two) times daily. 08/03/16   Elvina SidleKurt Lauenstein, MD  Cholecalciferol 1000 UNITS tablet Take 1,000 Units by mouth daily.      Historical Provider, MD  dexlansoprazole (DEXILANT) 60 MG capsule Take 1 capsule (60 mg total) by mouth every morning. For indigestion Patient taking differently: Take 60 mg by mouth daily as needed (acid reflux). For indigestion 01/30/13   Tresa GarterAleksei V Plotnikov, MD  fluticasone (FLONASE) 50 MCG/ACT nasal spray Place 2 sprays into both nostrils daily. 10/22/16   Tyrone Nineyan B Blease Capaldi, MD  glucose blood [facility](ONETOUCH VERIO) test strip Use as instructed 03/17/16   Veryl SpeakGregory D Calone, FNP  Lancets (FREESTYLE) lancets Use two times daily as instructed. Dx: 250.00 03/30/13   Tresa GarterAleksei V Plotnikov, MD  linagliptin (TRADJENTA) 5 MG TABS tablet Take 1 tablet (5 mg total) by mouth daily. 02/25/15   Romero BellingSean Ellison, MD  Multiple Vitamins-Calcium (ONE-A-DAY WOMENS PO) Take 1 tablet by mouth daily.    Historical Provider, MD  repaglinide (PRANDIN) 1 MG tablet Take 1 tablet (1 mg  total) by mouth 2 (two) times daily before a meal. 05/06/16   Romero BellingSean Ellison, MD  Tetrahydrozoline-Zn Sulfate (EYE DROPS ALLERGY RELIEF OP) Place 1 drop into both eyes daily as needed (allergies).    Historical Provider, MD    Family History Family History  Problem Relation Age of Onset  . Hypertension Mother   . Cancer Mother [id number]    Esophageal  . Hypertension Maternal Grandmother     Social History Social History  Substance Use Topics  . Smoking status: Never Smoker  . Smokeless tobacco: Never Used  . Alcohol use 0.0 oz/week     Comment: 2 wine or mixed drinks on a weekend     Allergies    Ciprofloxacin   Review of Systems Review of Systems No N/V/D, taking po, normal appetite. No fatigue.   Physical Exam Triage Vital Signs ED Triage Vitals  Enc Vitals Group     BP 10/22/16 1237 (!) 149/122     Pulse Rate 10/22/16 1237 95     Resp 10/22/16 1237 18     Temp 10/22/16 1237 98.3 F (36.8 C)     Temp Source 10/22/16 1237 Oral     SpO2 10/22/16 1237 96 %     Weight --      Height --      Head Circumference --      Peak Flow --      Pain Score 10/22/16 1244 5     Pain Loc --      Pain Edu? --      Excl. in GC? --    No data found.   Updated Vital Signs BP (!) 149/122 (BP Location: Left Arm) Comment: Patient stated that she has not taken her HTN meds today  Pulse 95   Temp 98.3 F (36.8 C) (Oral)   Resp 18   SpO2 96%   Physical Exam  Constitutional: She is oriented to person, place, and time. She appears well-developed and well-nourished. No distress.  HENT:  Right Ear: External ear normal.  Left Ear: External ear normal.  Mouth/Throat: Oropharynx is clear and moist.  boggy nonobstructing turbinates bilaterally with clear discharge.   Eyes: EOM are normal. Pupils are equal, round, and reactive to light. No scleral icterus.  Neck: Neck supple. No JVD present.  Cardiovascular: Normal rate, regular rhythm, normal heart sounds and intact distal pulses.   No murmur heard. Pulmonary/Chest: Effort normal and breath sounds normal. No respiratory distress.  Abdominal: Soft. Bowel sounds are normal. She exhibits no distension. There is no tenderness.  Musculoskeletal: Normal range of motion. She exhibits no edema or tenderness.  Lymphadenopathy:    She has no cervical adenopathy.  Neurological: She is alert and oriented to person, place, and time. She exhibits normal muscle tone.  Skin: Skin is warm and dry.  Vitals reviewed.    UC Treatments / Results  Labs (all labs ordered are listed, but only abnormal results are displayed) Labs Reviewed - No data to  display  EKG  EKG Interpretation None       Radiology No results found.  Procedures Procedures (including critical care time)  Medications Ordered in UC Medications - No data to display   Initial Impression / Assessment and Plan / UC Course  I have reviewed the triage vital signs and the nursing notes.  Pertinent labs & imaging results that were available during my care of the patient were reviewed by me and considered in my medical decision making (see chart  for details).  Final Clinical Impressions(s) / UC Diagnoses   Final diagnoses:  Acute seasonal allergic rhinitis, unspecified trigger   45 y.o. female presenting with upper respiratory allergic symptoms.  - Zyrtec, told to avoid zyrtec D due to risk of rebound symptoms and h/o HTN - Flonase and IN saline - Pt requested letter clearing her to be on a flight tomorrow for vacation. This was provided.   New Prescriptions New Prescriptions   FLUTICASONE (FLONASE) 50 MCG/ACT NASAL SPRAY    Place 2 sprays into both nostrils daily.     Tyrone Nine, MD 10/22/16 [phone]

## 2016-11-01 NOTE — Progress Notes (Signed)
Subjective:    Patient ID: [name] [name], female    DOB: 08/10/1971, 45 y.o.   MRN: [id number]  HPI Pt returns for f/u of diabetes mellitus: DM type: 2 Dx'ed: 2011 Complications: nephropathy Therapy: 5 oral meds.   GDM: never DKA: never Severe hypoglycemia: never Pancreatitis: never Other: She declines weight-loss surgery; she has never taken insulin, but she has learned how.   Interval history:  no cbg record, but states cbg's are well-controlled.  pt states she feels well in general.  She says she never misses the meds.   Pt has small multinodular goiter (dx'ed 2015; US showed the nodules too small to merit bx; f/u US in 2016 showed no signif change; she has been euthyroid on no rx). she does notice the goiter.  Past Medical History:  Diagnosis Date  . Anemia, iron deficiency   . Chronic headache    Dr Vela ProseLewitt, Neg MRI 2005  . Diabetes mellitus   . Elevated glucose 2011  . GERD (gastroesophageal reflux disease)   . HTN (hypertension)     Past Surgical History:  Procedure Laterality Date  . ABDOMINAL HYSTERECTOMY    . BREAST REDUCTION SURGERY    . Cyst removed from vagina    . INDUCED ABORTION  1990-1994   x 4  . NOSE SURGERY    . PARTIAL HYSTERECTOMY  2010  . TUBAL LIGATION      Social History   Social History  . Marital status: Single    Spouse name: N/A  . Number of children: 2  . Years of education: N/A   Occupational History  . Not on file.   Social History Main Topics  . Smoking status: Never Smoker  . Smokeless tobacco: Never Used  . Alcohol use 0.0 oz/week     Comment: 2 wine or mixed drinks on a weekend  . Drug use: No  . Sexual activity: Not on file   Other Topics Concern  . Not on file   Social History Narrative   Regular exercise - YES    Current Outpatient Prescriptions on File Prior to Visit  Medication Sig Dispense Refill  . bromocriptine (PARLODEL) 2.5 MG tablet Take 0.5 tablets (1.25 mg total) by mouth daily. 15 tablet 11  .  Canagliflozin (INVOKANA) 300 MG TABS Take 1 tablet (300 mg total) by mouth daily. 30 tablet 11  . cetirizine (ZYRTEC) 10 MG tablet Take 1 tablet (10 mg total) by mouth daily. 30 tablet 0  . chlorhexidine (PERIDEX) 0.12 % solution Use as directed 15 mLs in the mouth or throat 2 (two) times daily. 120 mL 0  . Cholecalciferol 1000 UNITS tablet Take 1,000 Units by mouth daily.      Marland Kitchen. dexlansoprazole (DEXILANT) 60 MG capsule Take 1 capsule (60 mg total) by mouth every morning. For indigestion (Patient taking differently: Take 60 mg by mouth daily as needed (acid reflux). For indigestion) 30 capsule 11  . fluticasone (FLONASE) 50 MCG/ACT nasal spray Place 2 sprays into both nostrils daily. 16 g 0  . glucose blood (ONETOUCH VERIO) test strip Use as instructed 100 each 12  . Lancets (FREESTYLE) lancets Use two times daily as instructed. Dx: 250.00 100 each 6  . linagliptin (TRADJENTA) 5 MG TABS tablet Take 1 tablet (5 mg total) by mouth daily. 30 tablet 11  . metFORMIN (GLUCOPHAGE) 500 MG tablet Take 2 tablets (1,000 mg total) by mouth 2 (two) times daily with a meal. 120 tablet 11  . Multiple Vitamins-Calcium (  ONE-A-DAY WOMENS PO) Take 1 tablet by mouth daily.    . Olmesartan-Amlodipine-HCTZ (TRIBENZOR) 40-10-25 MG TABS Take 1 tablet by mouth 1 day or 1 dose. 30 tablet 11  . repaglinide (PRANDIN) 1 MG tablet Take 1 tablet (1 mg total) by mouth 2 (two) times daily before a meal. 60 tablet 11  . Tetrahydrozoline-Zn Sulfate (EYE DROPS ALLERGY RELIEF OP) Place 1 drop into both eyes daily as needed (allergies).    . [DISCONTINUED] zolpidem (AMBIEN) 10 MG tablet Take 0.5-1 tablets (5-10 mg total) by mouth at bedtime as needed for sleep. 30 tablet 3   No current facility-administered medications on file prior to visit.     Allergies  Allergen Reactions  . Ciprofloxacin     REACTION: gittery    Family History  Problem Relation Age of Onset  . Hypertension Mother   . Cancer Mother 3159    Esophageal  .  Hypertension Maternal Grandmother     BP 136/76   Pulse 92   Ht 5\' 4"  (1.626 m)   Wt 231 lb (104.8 kg)   SpO2 95%   BMI 39.65 kg/m    Review of Systems She denies hypoglycemia.      Objective:   Physical Exam VITAL SIGNS:  See vs page GENERAL: no distress Pulses: dorsalis pedis intact bilat.   MSK: no deformity of the feet CV: no leg edema Skin:  no ulcer on the feet.  normal color and temp on the feet. Neuro: sensation is intact to touch on the feet    A1c=9.9% Lab Results  Component Value Date   TSH 0.72 11/03/2016   Lab Results  Component Value Date   CHOL 234 (H) 11/03/2016   HDL 50.20 11/03/2016   LDLCALC 164 (H) 11/03/2016   LDLDIRECT 137.4 11/04/2010   TRIG 100.0 11/03/2016   CHOLHDL 5 11/03/2016      Assessment & Plan:  Type 2 DM, with nephropathy.  a1c is much higher than reported cbg's.  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  However, she is still at high risk for abnormal thyroid function, so we'll continue to follow.  Dyslipidemia: I advised medication.   Patient is advised the following: Patient Instructions  check your blood sugar once a day.  vary the time of day when you check, between before the 3 meals, and at bedtime.  also check if you have symptoms of your blood sugar being too high or too low.  please keep a record of the readings and bring it to your next appointment here.  You can write it on any piece of paper.  please call us sooner if your blood sugar goes below 70, or if you have a lot of readings over 200.   Please continue the same medications. blood tests are requested for you today.  We'll let you know about the results.   If it is high like the a1c is today, we can add another pill, or you can start taking insulin.   Please come back for a follow-up appointment in 6 months.

## 2016-11-03 ENCOUNTER — Ambulatory Visit (INDEPENDENT_AMBULATORY_CARE_PROVIDER_SITE_OTHER): Payer: 59 | Admitting: Endocrinology

## 2016-11-03 ENCOUNTER — Encounter: Payer: Self-pay | Admitting: Endocrinology

## 2016-11-03 VITALS — BP 136/76 | HR 92 | Ht 64.0 in | Wt 231.0 lb

## 2016-11-03 DIAGNOSIS — E119 Type 2 diabetes mellitus without complications: Secondary | ICD-10-CM | POA: Diagnosis not present

## 2016-11-03 DIAGNOSIS — E042 Nontoxic multinodular goiter: Secondary | ICD-10-CM

## 2016-11-03 DIAGNOSIS — Z0000 Encounter for general adult medical examination without abnormal findings: Secondary | ICD-10-CM | POA: Insufficient documentation

## 2016-11-03 DIAGNOSIS — Z23 Encounter for immunization: Secondary | ICD-10-CM

## 2016-11-03 LAB — POCT GLYCOSYLATED HEMOGLOBIN (HGB A1C): HEMOGLOBIN A1C: 9.9

## 2016-11-03 NOTE — Patient Instructions (Addendum)
check your blood sugar once a day.  vary the time of day when you check, between before the 3 meals, and at bedtime.  also check if you have symptoms of your blood sugar being too high or too low.  please keep a record of the readings and bring it to your next appointment here.  You can write it on any piece of paper.  please call us sooner if your blood sugar goes below 70, or if you have a lot of readings over 200.   Please continue the same medications. blood tests are requested for you today.  We'll let you know about the results.   If it is high like the a1c is today, we can add another pill, or you can start taking insulin.   Please come back for a follow-up appointment in 6 months.

## 2016-11-04 LAB — BASIC METABOLIC PANEL
BUN: 16 mg/dL (ref 6–23)
CALCIUM: 9.1 mg/dL (ref 8.4–10.5)
CO2: 29 mEq/L (ref 19–32)
Chloride: 97 mEq/L (ref 96–112)
Creatinine, Ser: 0.83 mg/dL (ref 0.40–1.20)
GFR: 95.23 mL/min (ref >60.00)
Glucose, Bld: 110 mg/dL — ABNORMAL HIGH (ref 70–99)
Potassium: 3.6 mEq/L (ref 3.5–5.1)
SODIUM: 136 meq/L (ref 135–145)

## 2016-11-04 LAB — HEPATIC FUNCTION PANEL
ALBUMIN: 4 g/dL (ref 3.5–5.2)
ALK PHOS: 74 U/L (ref 39–117)
ALT: 17 U/L (ref 0–35)
AST: 16 U/L (ref 0–37)
BILIRUBIN TOTAL: 0.4 mg/dL (ref 0.2–1.2)
Bilirubin, Direct: 0.1 mg/dL (ref 0.0–0.3)
Total Protein: 7.3 g/dL (ref 6.0–8.3)

## 2016-11-04 LAB — CBC WITH DIFFERENTIAL/PLATELET
BASOS PCT: 0.3 % (ref 0.0–3.0)
Basophils Absolute: 0 10*3/uL (ref 0.0–0.1)
EOS PCT: 2.4 % (ref 0.0–5.0)
Eosinophils Absolute: 0.2 10*3/uL (ref 0.0–0.7)
HCT: 42.1 % (ref 36.0–46.0)
HEMOGLOBIN: 13.8 g/dL (ref 12.0–15.0)
Lymphocytes Relative: 36.7 % (ref 12.0–46.0)
Lymphs Abs: 3.6 10*3/uL (ref 0.7–4.0)
MCHC: 32.9 g/dL (ref 30.0–36.0)
MCV: 86.3 fl (ref 78.0–100.0)
MONOS PCT: 9.8 % (ref 3.0–12.0)
Monocytes Absolute: 1 10*3/uL (ref 0.1–1.0)
Neutro Abs: 5 10*3/uL (ref 1.4–7.7)
Neutrophils Relative %: 50.8 % (ref 43.0–77.0)
Platelets: 324 10*3/uL (ref 150.0–400.0)
RBC: 4.87 Mil/uL (ref 3.87–5.11)
RDW: 14.2 % (ref 11.5–15.5)
WBC: 9.8 10*3/uL (ref 4.0–10.5)

## 2016-11-04 LAB — LIPID PANEL
CHOL/HDL RATIO: 5
Cholesterol: 234 mg/dL — ABNORMAL HIGH (ref 0–200)
HDL: 50.2 mg/dL (ref >39.00)
LDL CALC: 164 mg/dL — AB (ref 0–99)
NONHDL: 183.79
Triglycerides: 100 mg/dL (ref 0.0–149.0)
VLDL: 20 mg/dL (ref 0.0–40.0)

## 2016-11-04 LAB — TSH: TSH: 0.72 u[IU]/mL (ref 0.35–4.50)

## 2016-11-06 LAB — FRUCTOSAMINE: FRUCTOSAMINE: 340 umol/L — AB (ref 190–270)

## 2016-11-08 ENCOUNTER — Other Ambulatory Visit: Payer: Self-pay | Admitting: Endocrinology

## 2016-11-08 MED ORDER — REPAGLINIDE 2 MG PO TABS: 2.0000 mg | ORAL_TABLET | Freq: Three times a day (TID) | ORAL | 11 refills | Status: DC

## 2017-03-02 DIAGNOSIS — E119 Type 2 diabetes mellitus without complications: Secondary | ICD-10-CM | POA: Diagnosis not present

## 2017-03-02 DIAGNOSIS — H02844 Edema of left upper eyelid: Secondary | ICD-10-CM | POA: Diagnosis not present

## 2017-03-16 ENCOUNTER — Other Ambulatory Visit (INDEPENDENT_AMBULATORY_CARE_PROVIDER_SITE_OTHER): Payer: 59

## 2017-03-16 ENCOUNTER — Telehealth: Payer: Self-pay | Admitting: Internal Medicine

## 2017-03-16 ENCOUNTER — Encounter: Payer: Self-pay | Admitting: Internal Medicine

## 2017-03-16 ENCOUNTER — Ambulatory Visit (INDEPENDENT_AMBULATORY_CARE_PROVIDER_SITE_OTHER): Payer: 59 | Admitting: Internal Medicine

## 2017-03-16 VITALS — BP 136/88 | HR 81 | Temp 99.0°F | Ht 64.0 in | Wt 233.0 lb

## 2017-03-16 DIAGNOSIS — M545 Low back pain, unspecified: Secondary | ICD-10-CM

## 2017-03-16 DIAGNOSIS — S30860A Insect bite (nonvenomous) of lower back and pelvis, initial encounter: Secondary | ICD-10-CM | POA: Diagnosis not present

## 2017-03-16 DIAGNOSIS — W57XXXA Bitten or stung by nonvenomous insect and other nonvenomous arthropods, initial encounter: Secondary | ICD-10-CM | POA: Diagnosis not present

## 2017-03-16 DIAGNOSIS — R300 Dysuria: Secondary | ICD-10-CM | POA: Diagnosis not present

## 2017-03-16 LAB — URINALYSIS, ROUTINE W REFLEX MICROSCOPIC
Bilirubin Urine: NEGATIVE
Hgb urine dipstick: NEGATIVE
KETONES UR: NEGATIVE
Leukocytes, UA: NEGATIVE
NITRITE: NEGATIVE
PH: 7 (ref 5.0–8.0)
RBC / HPF: NONE SEEN (ref 0–?)
Specific Gravity, Urine: 1.02 (ref 1.000–1.030)
Total Protein, Urine: 100 — AB
Urine Glucose: NEGATIVE
Urobilinogen, UA: 1 (ref 0.0–1.0)

## 2017-03-16 MED ORDER — CEPHALEXIN 500 MG PO CAPS: 500.0000 mg | ORAL_CAPSULE | Freq: Three times a day (TID) | ORAL | 0 refills | Status: AC

## 2017-03-16 MED ORDER — MELOXICAM 15 MG PO TABS: 15.0000 mg | ORAL_TABLET | Freq: Every day | ORAL | 2 refills | Status: DC | PRN

## 2017-03-16 MED ORDER — CYCLOBENZAPRINE HCL 5 MG PO TABS: 5.0000 mg | ORAL_TABLET | Freq: Three times a day (TID) | ORAL | 1 refills | Status: DC | PRN

## 2017-03-16 NOTE — Telephone Encounter (Signed)
Pt is being seen to day by Dr Jonny Ruiz at 11:00

## 2017-03-16 NOTE — Progress Notes (Signed)
Subjective:    Patient ID: [name] [name], female    DOB: 1971-05-02, 46 y.o.   MRN: [id number]  HPI  Here with c/o 3-4 days onset mild to mod LBP, midline without radiation or LE involvement, worse to stand up , bend or twist, better to not do this, Has had dysuria onset this am, but denies urinary symptoms such as frequency, urgency, flank pain, hematuria or n/v, fever, chills.  Denies worsening reflux, abd pain, dysphagia, n/v, bowel change or blood.  Pt denies chest pain, increased sob or doe, wheezing, orthopnea, PND, increased LE swelling, palpitations, dizziness or syncope.  Pt denies new neurological symptoms such as new headache, or facial or extremity weakness or numbness   Pt denies polydipsia, polyuria  Did have recent tick bite to right lower back but no rash Past Medical History:  Diagnosis Date  . Anemia, iron deficiency   . Chronic headache    Dr Vela Prose, Neg MRI 2005  . Diabetes mellitus   . Elevated glucose 2011  . GERD (gastroesophageal reflux disease)   . HTN (hypertension)    Past Surgical History:  Procedure Laterality Date  . ABDOMINAL HYSTERECTOMY    . BREAST REDUCTION SURGERY    . Cyst removed from vagina    . INDUCED ABORTION  1990-1994   x 4  . NOSE SURGERY    . PARTIAL HYSTERECTOMY  2010  . TUBAL LIGATION      reports that she has never smoked. She has never used smokeless tobacco. She reports that she drinks alcohol. She reports that she does not use drugs. family history includes Cancer (age of onset: 70) in her mother; Hypertension in her maternal grandmother and mother. Allergies  Allergen Reactions  . Ciprofloxacin     REACTION: gittery  . Tramadol Other (See Comments)    Headaches worse   Current Outpatient Prescriptions on File Prior to Visit  Medication Sig Dispense Refill  . bromocriptine (PARLODEL) 2.5 MG tablet Take 0.5 tablets (1.25 mg total) by mouth daily. 15 tablet 11  . Canagliflozin (INVOKANA) 300 MG TABS Take 1 tablet (300 mg  total) by mouth daily. 30 tablet 11  . cetirizine (ZYRTEC) 10 MG tablet Take 1 tablet (10 mg total) by mouth daily. 30 tablet 0  . chlorhexidine (PERIDEX) 0.12 % solution Use as directed 15 mLs in the mouth or throat 2 (two) times daily. 120 mL 0  . Cholecalciferol 1000 UNITS tablet Take 1,000 Units by mouth daily.      Marland Kitchen dexlansoprazole (DEXILANT) 60 MG capsule Take 1 capsule (60 mg total) by mouth every morning. For indigestion (Patient taking differently: Take 60 mg by mouth daily as needed (acid reflux). For indigestion) 30 capsule 11  . fluticasone (FLONASE) 50 MCG/ACT nasal spray Place 2 sprays into both nostrils daily. 16 g 0  . glucose blood (ONETOUCH VERIO) test strip Use as instructed 100 each 12  . Lancets (FREESTYLE) lancets Use two times daily as instructed. Dx: 250.00 100 each 6  . linagliptin (TRADJENTA) 5 MG TABS tablet Take 1 tablet (5 mg total) by mouth daily. 30 tablet 11  . metFORMIN (GLUCOPHAGE) 500 MG tablet Take 2 tablets (1,000 mg total) by mouth 2 (two) times daily with a meal. 120 tablet 11  . Multiple Vitamins-Calcium (ONE-A-DAY WOMENS PO) Take 1 tablet by mouth daily.    . Olmesartan-Amlodipine-HCTZ (TRIBENZOR) 40-10-25 MG TABS Take 1 tablet by mouth 1 day or 1 dose. 30 tablet 11  . repaglinide (PRANDIN) 2  MG tablet Take 1 tablet (2 mg total) by mouth 3 (three) times daily before meals. 90 tablet 11  . Tetrahydrozoline-Zn Sulfate (EYE DROPS ALLERGY RELIEF OP) Place 1 drop into both eyes daily as needed (allergies).    . [DISCONTINUED] zolpidem (AMBIEN) 10 MG tablet Take 0.5-1 tablets (5-10 mg total) by mouth at bedtime as needed for sleep. 30 tablet 3   No current facility-administered medications on file prior to visit.    Review of Systems  Constitutional: Negative for other unusual diaphoresis or sweats HENT: Negative for ear discharge or swelling Eyes: Negative for other worsening visual disturbances Respiratory: Negative for stridor or other swelling    Gastrointestinal: Negative for worsening distension or other blood Genitourinary: Negative for retention or other urinary change Musculoskeletal: Negative for other MSK pain or swelling Skin: Negative for color change or other new lesions Neurological: Negative for worsening tremors and other numbness  Psychiatric/Behavioral: Negative for worsening agitation or other fatigue All other system neg per pt    Objective:   Physical Exam BP 136/88   Pulse 81   Temp 99 F (37.2 C) (Oral)   Ht  (1.626 m)   Wt 233 lb (105.7 kg)   SpO2 98%   BMI 39.99 kg/m  VS noted,  Constitutional: Pt appears in NAD HENT: Head: NCAT.  Right Ear: External ear normal.  Left Ear: External ear normal.  Eyes: . Pupils are equal, round, and reactive to light. Conjunctivae and EOM are normal Nose: without d/c or deformity Neck: Neck supple. Gross normal ROM Cardiovascular: Normal rate and regular rhythm.   Pulmonary/Chest: Effort normal and breath sounds without rales or wheezing.  Abd:  Soft, NT, ND, + BS, no organomegaly Neurological: Pt is alert. At baseline orientation, motor grossly intact Skin: Skin is warm. No rashes, other new lesions, no LE edema Psychiatric: Pt behavior is normal without agitation  Spine nontender    Assessment & Plan:

## 2017-03-16 NOTE — Telephone Encounter (Signed)
Patient Name: [name] [name]  DOB: 17-Jan-1971    Initial Comment Caller states she got bit by a Tick over the weekend, and she's having back pain.   Nurse Assessment  Nurse: Stefano Gaul, RN, Dwana Curd Date/Time (Eastern Time): 03/16/2017 7:31:01 AM  Confirm and document reason for call. If symptomatic, describe symptoms. ---Caller states she was bitten by a tick on the weekend. She was bitten on her back. Having back pain. She removed the tick. tick bite is not inflamed. Pain is on her spine area. pain level 5. No fever. No injury.  Does the patient have any new or worsening symptoms? ---Yes  Will a triage be completed? ---Yes  Related visit to physician within the last 2 weeks? ---No  Does the PT have any chronic conditions? (i.e. diabetes, asthma, etc.) ---Yes  List chronic conditions. ---diabetes; HTN  Is the patient pregnant or possibly pregnant? (Ask all females between the ages of 30-55) ---No  Is this a behavioral health or substance abuse call? ---No     Guidelines    Guideline Title Affirmed Question Affirmed Notes  Back Pain Back pain   Tick Bite Tick bite with no complications    Final Disposition User   See Physician within 24 Hours [place], RN, Vera    Comments  triage outcome upgraded to see physician within 24 hrs as pt had a tick bite this weekend near where the back pain is.   Referrals  REFERRED TO PCP OFFICE   Disagree/Comply: Comply    Disagree/Comply: Comply  appt scheduled for 03/16/17 at 11 am with Dr. Oliver Barre

## 2017-03-16 NOTE — Patient Instructions (Signed)
Please take all new medication as prescribed - the antibiotic, as well as the anti-inflammatory and muscle relaxer  Please continue all other medications as before, and refills have been done if requested.  Please have the pharmacy call with any other refills you may need.  Please keep your appointments with your specialists as you may have planned  Please go to the LAB in the Basement (turn left off the elevator) for the tests to be done today - for urine testing  You will be contacted by phone if any changes need to be made immediately.  Otherwise, you will receive a letter about your results with an explanation, but please check with MyChart first.  Please remember to sign up for MyChart if you have not done so, as this will be important to you in the future with finding out test results, communicating by private email, and scheduling acute appointments online when needed.

## 2017-03-16 NOTE — Progress Notes (Signed)
Pre visit review using our clinic review tool, if applicable. No additional management support is needed unless otherwise documented below in the visit note. 

## 2017-03-18 LAB — URINE CULTURE

## 2017-03-22 NOTE — Assessment & Plan Note (Addendum)
c/w likely msk strain vs flare underlying djd or DDD, for nsaid and muscle relaxer prn,  to f/u any worsening symptoms or concerns, for imaging if worsening or persistent

## 2017-03-22 NOTE — Assessment & Plan Note (Signed)
Mild to mod, for antibx course,  to f/u any worsening symptoms or concerns 

## 2017-03-22 NOTE — Assessment & Plan Note (Signed)
For UA, tx pending results

## 2017-04-01 ENCOUNTER — Other Ambulatory Visit: Payer: Self-pay | Admitting: Internal Medicine

## 2017-05-04 ENCOUNTER — Ambulatory Visit: Payer: 59 | Admitting: Endocrinology

## 2017-06-08 ENCOUNTER — Encounter: Payer: Self-pay | Admitting: Endocrinology

## 2017-06-08 ENCOUNTER — Ambulatory Visit (INDEPENDENT_AMBULATORY_CARE_PROVIDER_SITE_OTHER): Payer: 59 | Admitting: Endocrinology

## 2017-06-08 VITALS — BP 134/90 | HR 75 | Wt 233.0 lb

## 2017-06-08 DIAGNOSIS — E119 Type 2 diabetes mellitus without complications: Secondary | ICD-10-CM | POA: Diagnosis not present

## 2017-06-08 LAB — POCT GLYCOSYLATED HEMOGLOBIN (HGB A1C): HEMOGLOBIN A1C: 11.6

## 2017-06-08 MED ORDER — BASAGLAR KWIKPEN 100 UNIT/ML ~~LOC~~ SOPN: 20.0000 [IU] | PEN_INJECTOR | SUBCUTANEOUS | 11 refills | Status: DC

## 2017-06-08 NOTE — Progress Notes (Signed)
Subjective:    Patient ID: [name] [name], female    DOB: 11/21/1971, 46 y.o.   MRN: [id number]  HPI Pt returns for f/u of diabetes mellitus: DM type: 2 Dx'ed: 2011 Complications: nephropathy.   Therapy: 5 oral meds.   GDM: never DKA: never Severe hypoglycemia: never Pancreatitis: never Other: She declines weight-loss surgery; she has never taken insulin, but she has learned how.   Interval history: no cbg record, but states cbg's are in the 300's.  pt states she feels well in general.   Pt has small multinodular goiter (dx'ed 2015; US showed the nodules too small to merit bx; f/u US in 2016 showed no signif change; she has been euthyroid on no rx). she does notice the goiter.   Past Medical History:  Diagnosis Date  . Anemia, iron deficiency   . Chronic headache    Dr Vela ProseLewitt, Neg MRI 2005  . Diabetes mellitus   . Elevated glucose 2011  . GERD (gastroesophageal reflux disease)   . HTN (hypertension)     Past Surgical History:  Procedure Laterality Date  . ABDOMINAL HYSTERECTOMY    . BREAST REDUCTION SURGERY    . Cyst removed from vagina    . INDUCED ABORTION  1990-1994   x 4  . NOSE SURGERY    . PARTIAL HYSTERECTOMY  2010  . TUBAL LIGATION      Social History   Social History  . Marital status: Single    Spouse name: N/A  . Number of children: 2  . Years of education: N/A   Occupational History  . Not on file.   Social History Main Topics  . Smoking status: Never Smoker  . Smokeless tobacco: Never Used  . Alcohol use 0.0 oz/week     Comment: 2 wine or mixed drinks on a weekend  . Drug use: No  . Sexual activity: Not on file   Other Topics Concern  . Not on file   Social History Narrative   Regular exercise - YES    Current Outpatient Prescriptions on File Prior to Visit  Medication Sig Dispense Refill  . bromocriptine (PARLODEL) 2.5 MG tablet Take 0.5 tablets (1.25 mg total) by mouth daily. 15 tablet 11  . Canagliflozin (INVOKANA) 300 MG TABS  Take 1 tablet (300 mg total) by mouth daily. 30 tablet 11  . cetirizine (ZYRTEC) 10 MG tablet Take 1 tablet (10 mg total) by mouth daily. 30 tablet 0  . chlorhexidine (PERIDEX) 0.12 % solution Use as directed 15 mLs in the mouth or throat 2 (two) times daily. 120 mL 0  . Cholecalciferol 1000 UNITS tablet Take 1,000 Units by mouth daily.      Marland Kitchen. dexlansoprazole (DEXILANT) 60 MG capsule Take 1 capsule (60 mg total) by mouth every morning. For indigestion (Patient taking differently: Take 60 mg by mouth daily as needed (acid reflux). For indigestion) 30 capsule 11  . fluticasone (FLONASE) 50 MCG/ACT nasal spray Place 2 sprays into both nostrils daily. 16 g 0  . glucose blood (ONETOUCH VERIO) test strip Use as instructed 100 each 12  . Lancets (FREESTYLE) lancets Use two times daily as instructed. Dx: 250.00 100 each 6  . linagliptin (TRADJENTA) 5 MG TABS tablet Take 1 tablet (5 mg total) by mouth daily. 30 tablet 11  . meloxicam (MOBIC) 15 MG tablet Take 1 tablet (15 mg total) by mouth daily as needed for pain. 30 tablet 2  . metFORMIN (GLUCOPHAGE) 500 MG tablet Take 2 tablets (  1,000 mg total) by mouth 2 (two) times daily with a meal. 120 tablet 11  . Multiple Vitamins-Calcium (ONE-A-DAY WOMENS PO) Take 1 tablet by mouth daily.    . Olmesartan-Amlodipine-HCTZ (TRIBENZOR) 40-10-25 MG TABS Take 1 tablet by mouth 1 day or 1 dose. 30 tablet 11  . repaglinide (PRANDIN) 2 MG tablet Take 1 tablet (2 mg total) by mouth 3 (three) times daily before meals. 90 tablet 11  . Tetrahydrozoline-Zn Sulfate (EYE DROPS ALLERGY RELIEF OP) Place 1 drop into both eyes daily as needed (allergies).    . cyclobenzaprine (FLEXERIL) 5 MG tablet take 1 tablet by mouth three times a day if needed for muscle spasm (Patient not taking: Reported on 06/08/2017) 30 tablet 0  . [DISCONTINUED] zolpidem (AMBIEN) 10 MG tablet Take 0.5-1 tablets (5-10 mg total) by mouth at bedtime as needed for sleep. 30 tablet 3   No current  facility-administered medications on file prior to visit.     Allergies  Allergen Reactions  . Ciprofloxacin     REACTION: gittery  . Tramadol Other (See Comments)    Headaches worse    Family History  Problem Relation Age of Onset  . Hypertension Mother   . Cancer Mother 110       Esophageal  . Hypertension Maternal Grandmother    BP 134/90 (BP Location: Left Arm, Patient Position: Sitting)   Pulse 75   Wt 233 lb (105.7 kg)   SpO2 98%   BMI 39.99 kg/m   Review of Systems She denies hypoglycemia.      Objective:   Physical Exam VITAL SIGNS:  See vs page GENERAL: no distress Pulses: dorsalis pedis intact bilat.   MSK: no deformity of the feet CV: trace bilat leg edema Skin:  no ulcer on the feet.  normal color and temp on the feet. Neuro: sensation is intact to touch on the feet.   A1c=11.4%    Assessment & Plan:  Type 2 DM: severe exacerbation.  She agrees to take insulin.  I advised multiple daily injections, but she declines.  HTN: invokana may be helping, so she may need to continue this.     Patient Instructions  check your blood sugar once a day.  vary the time of day when you check, between before the 3 meals, and at bedtime.  also check if you have symptoms of your blood sugar being too high or too low.  please keep a record of the readings and bring it to your next appointment here.  You can write it on any piece of paper.  please call us sooner if your blood sugar goes below 70, or if you have a lot of readings over 200.   I have sent a prescription to your pharmacy, to start basaglar, 20 units each morning. Please continue the same other diabetes medications for now, but we'll reduce these with time. Please call us next week, to tell us how the blood sugar is doing.   Please come back for a follow-up appointment in 2 months.

## 2017-06-08 NOTE — Patient Instructions (Addendum)
check your blood sugar once a day.  vary the time of day when you check, between before the 3 meals, and at bedtime.  also check if you have symptoms of your blood sugar being too high or too low.  please keep a record of the readings and bring it to your next appointment here.  You can write it on any piece of paper.  please call us sooner if your blood sugar goes below 70, or if you have a lot of readings over 200.   I have sent a prescription to your pharmacy, to start basaglar, 20 units each morning. Please continue the same other diabetes medications for now, but we'll reduce these with time. Please call us next week, to tell us how the blood sugar is doing.   Please come back for a follow-up appointment in 2 months.

## 2017-06-09 ENCOUNTER — Other Ambulatory Visit: Payer: Self-pay

## 2017-06-09 MED ORDER — INSULIN PEN NEEDLE 32G X 4 MM MISC: 5 refills | Status: DC

## 2017-06-15 ENCOUNTER — Telehealth: Payer: Self-pay | Admitting: Endocrinology

## 2017-06-15 ENCOUNTER — Encounter: Payer: 59 | Attending: Endocrinology | Admitting: Nutrition

## 2017-06-15 ENCOUNTER — Telehealth: Payer: Self-pay | Admitting: Nutrition

## 2017-06-15 DIAGNOSIS — Z794 Long term (current) use of insulin: Secondary | ICD-10-CM

## 2017-06-15 DIAGNOSIS — Z713 Dietary counseling and surveillance: Secondary | ICD-10-CM | POA: Insufficient documentation

## 2017-06-15 DIAGNOSIS — E119 Type 2 diabetes mellitus without complications: Secondary | ICD-10-CM | POA: Insufficient documentation

## 2017-06-15 NOTE — Progress Notes (Signed)
Patient reports no problems with taking 20u of Basaglar at night.  She is rotating sites appropriately, and denies any low blood sugar symptoms. She is testing blood sugar 2X/day: Date           FBS  2hr. pcB       acS   2hr.pcS      HS 06/10/17     252                         154      141 06/11/17     163                                     331 (2 hotdogs                                                                     Sweet drink) 06/12/17      172  06/13/17        160    06/14/17         129                                   203 06/15/17         159        139  She was shown that when she drinks sweet drinks, blood sugars go high and stay high until next morning.  She agreed to stop theses.  She is doing a treadmil, and is up to 1 mile (15 min.), 3-4 times this past week.  Her sister is on a GLP1 and she was asking about this for her, to help her with weight.  We discussed how this works, and she is interested in doing the once a week one.  I told her that I would ask Dr. Everardo AllEllison if he thinks this would help her.    She had no final questions.

## 2017-06-15 NOTE — Patient Instructions (Signed)
Continue to test blood sugars fasting, and one other time during the day. Continue to exercise for 30 min. On treadmill

## 2017-06-15 NOTE — Telephone Encounter (Signed)
please call patient: How are cbg's doing?

## 2017-06-17 MED ORDER — DULAGLUTIDE 0.75 MG/0.5ML ~~LOC~~ SOAJ: 0.7500 mg | SUBCUTANEOUS | 11 refills | Status: DC

## 2017-06-17 NOTE — Telephone Encounter (Signed)
Ok, I have sent a prescription to your pharmacy, to start "trulicity." Please reduce the insulin to 10 units qam I'll see you next time.

## 2017-06-17 NOTE — Telephone Encounter (Signed)
I contacted the patient. She stated on 06/15/2017 she saw Cristy FolksLinda Spagnola and she downloaded her meter to review. I looked in the Linda's office and the scan pile and could not find this information.  Patient stated her blood sugar readings have been has followed. 06/17/2017: 121 Fasting   06/16/2017: 135 Fasting no pm reading.  06/15/2017: 159 Fasting and 139 before dinner.

## 2017-06-17 NOTE — Addendum Note (Signed)
Addended by: Romero BellingELLISON, Celisse Ciulla on: 06/17/2017 12:02 PM   Modules accepted: Orders

## 2017-06-17 NOTE — Telephone Encounter (Signed)
Patient notified of MD's instructions and voiced understanding. Patient had no further questions at this time.

## 2017-06-21 NOTE — Telephone Encounter (Signed)
Chart opened in error

## 2017-07-03 ENCOUNTER — Other Ambulatory Visit: Payer: Self-pay | Admitting: Family

## 2017-07-04 ENCOUNTER — Other Ambulatory Visit: Payer: Self-pay | Admitting: Endocrinology

## 2017-07-29 ENCOUNTER — Encounter: Payer: Self-pay | Admitting: Endocrinology

## 2017-07-29 ENCOUNTER — Ambulatory Visit (INDEPENDENT_AMBULATORY_CARE_PROVIDER_SITE_OTHER): Payer: 59 | Admitting: Endocrinology

## 2017-07-29 VITALS — BP 122/74 | HR 78 | Ht 64.0 in | Wt 231.0 lb

## 2017-07-29 DIAGNOSIS — E119 Type 2 diabetes mellitus without complications: Secondary | ICD-10-CM | POA: Diagnosis not present

## 2017-07-29 DIAGNOSIS — H47323 Drusen of optic disc, bilateral: Secondary | ICD-10-CM | POA: Diagnosis not present

## 2017-07-29 LAB — HM DIABETES EYE EXAM

## 2017-07-29 LAB — POCT GLYCOSYLATED HEMOGLOBIN (HGB A1C): HEMOGLOBIN A1C: 8.8

## 2017-07-29 MED ORDER — DULAGLUTIDE 1.5 MG/0.5ML ~~LOC~~ SOAJ: 1.5000 mg | SUBCUTANEOUS | 11 refills | Status: DC

## 2017-07-29 NOTE — Progress Notes (Signed)
Subjective:    Patient ID: [name] [name], female    DOB: 10/13/1971, 46 y.o.   MRN: [id number]  HPI Pt returns for f/u of diabetes mellitus:  DM type: 2 Dx'ed: 2011 Complications: nephropathy.   Therapy: insulin since June of 2018, trulicity.  GDM: never DKA: never Severe hypoglycemia: never Pancreatitis: never Other: She declines weight-loss surgery; she has never taken insulin, but she has learned how.   Interval history: no cbg record, but states cbg's vary from 119-168.  pt states she feels well in general.  She has reduced basaglar to 10 units qd.   Pt has small multinodular goiter (dx'ed 2015; US showed the nodules too small to merit bx; f/u US in 2016 showed no signif change; she has been euthyroid on no rx). she does notice the goiter. Past Medical History:  Diagnosis Date  . Anemia, iron deficiency   . Chronic headache    Dr Vela ProseLewitt, Neg MRI 2005  . Diabetes mellitus   . Elevated glucose 2011  . GERD (gastroesophageal reflux disease)   . HTN (hypertension)     Past Surgical History:  Procedure Laterality Date  . ABDOMINAL HYSTERECTOMY    . BREAST REDUCTION SURGERY    . Cyst removed from vagina    . INDUCED ABORTION  1990-1994   x 4  . NOSE SURGERY    . PARTIAL HYSTERECTOMY  2010  . TUBAL LIGATION      Social History   Social History  . Marital status: Single    Spouse name: N/A  . Number of children: 2  . Years of education: N/A   Occupational History  . Not on file.   Social History Main Topics  . Smoking status: Never Smoker  . Smokeless tobacco: Never Used  . Alcohol use 0.0 oz/week     Comment: 2 wine or mixed drinks on a weekend  . Drug use: No  . Sexual activity: Not on file   Other Topics Concern  . Not on file   Social History Narrative   Regular exercise - YES    Current Outpatient Prescriptions on File Prior to Visit  Medication Sig Dispense Refill  . bromocriptine (PARLODEL) 2.5 MG tablet Take 0.5 tablets (1.25 mg total) by  mouth daily. 15 tablet 11  . Canagliflozin (INVOKANA) 300 MG TABS Take 1 tablet (300 mg total) by mouth daily. 30 tablet 11  . cetirizine (ZYRTEC) 10 MG tablet Take 1 tablet (10 mg total) by mouth daily. 30 tablet 0  . chlorhexidine (PERIDEX) 0.12 % solution Use as directed 15 mLs in the mouth or throat 2 (two) times daily. 120 mL 0  . Cholecalciferol 1000 UNITS tablet Take 1,000 Units by mouth daily.      Marland Kitchen. dexlansoprazole (DEXILANT) 60 MG capsule Take 1 capsule (60 mg total) by mouth every morning. For indigestion (Patient taking differently: Take 60 mg by mouth daily as needed (acid reflux). For indigestion) 30 capsule 11  . fluticasone (FLONASE) 50 MCG/ACT nasal spray Place 2 sprays into both nostrils daily. 16 g 0  . Insulin Pen Needle (BD PEN NEEDLE NANO U/F) 32G X 4 MM MISC Use to inject insulin 1 time per day. 100 each 5  . Lancets (FREESTYLE) lancets Use two times daily as instructed. Dx: 250.00 100 each 6  . linagliptin (TRADJENTA) 5 MG TABS tablet Take 1 tablet (5 mg total) by mouth daily. 30 tablet 11  . meloxicam (MOBIC) 15 MG tablet Take 1 tablet (15 mg  total) by mouth daily as needed for pain. 30 tablet 2  . metFORMIN (GLUCOPHAGE) 500 MG tablet Take 2 tablets (1,000 mg total) by mouth 2 (two) times daily with a meal. 120 tablet 11  . Multiple Vitamins-Calcium (ONE-A-DAY WOMENS PO) Take 1 tablet by mouth daily.    . Olmesartan-Amlodipine-HCTZ (TRIBENZOR) 40-10-25 MG TABS Take 1 tablet by mouth 1 day or 1 dose. 30 tablet 11  . ONETOUCH VERIO test strip TEST as directed 100 each 3  . ONETOUCH VERIO test strip TEST as directed 100 each 3  . repaglinide (PRANDIN) 2 MG tablet Take 1 tablet (2 mg total) by mouth 3 (three) times daily before meals. 90 tablet 11  . Tetrahydrozoline-Zn Sulfate (EYE DROPS ALLERGY RELIEF OP) Place 1 drop into both eyes daily as needed (allergies).    . [DISCONTINUED] zolpidem (AMBIEN) 10 MG tablet Take 0.5-1 tablets (5-10 mg total) by mouth at bedtime as needed  for sleep. 30 tablet 3   No current facility-administered medications on file prior to visit.     Allergies  Allergen Reactions  . Ciprofloxacin     REACTION: gittery  . Tramadol Other (See Comments)    Headaches worse    Family History  Problem Relation Age of Onset  . Hypertension Mother   . Cancer Mother 57       Esophageal  . Hypertension Maternal Grandmother     BP 122/74   Pulse 78   Ht 5\' 4"  (1.626 m)   Wt 231 lb (104.8 kg)   SpO2 97%   BMI 39.65 kg/m   Review of Systems She has lost 2 lbs.      Objective:   Physical Exam VITAL SIGNS:  See vs page GENERAL: no distress Pulses: foot pulses are intact bilaterally.   MSK: no deformity of the feet or ankles.  CV: no edema of the legs or ankles Skin:  no ulcer on the feet or ankles, but the skin is dry.  normal color and temp on the feet and ankles Neuro: sensation is intact to touch on the feet and ankles.    Lab Results  Component Value Date   HGBA1C 8.8 07/29/2017      Assessment & Plan:  type 2 DM, with nephropathy: she can have a trial off insulin   Patient Instructions  For now, please: Double the trulicuty, and:  Stop taking the insulin, and:  Please continue the same other medications.  check your blood sugar once a day.  vary the time of day when you check, between before the 3 meals, and at bedtime.  also check if you have symptoms of your blood sugar being too high or too low.  please keep a record of the readings and bring it to your next appointment here (or you can bring the meter itself).  You can write it on any piece of paper.  please call us sooner if your blood sugar goes below 70, or if you have a lot of readings over 200. Please come back for a follow-up appointment in 2 months.

## 2017-07-29 NOTE — Patient Instructions (Addendum)
For now, please: Double the trulicuty, and:  Stop taking the insulin, and:  Please continue the same other medications.  check your blood sugar once a day.  vary the time of day when you check, between before the 3 meals, and at bedtime.  also check if you have symptoms of your blood sugar being too high or too low.  please keep a record of the readings and bring it to your next appointment here (or you can bring the meter itself).  You can write it on any piece of paper.  please call us sooner if your blood sugar goes below 70, or if you have a lot of readings over 200. Please come back for a follow-up appointment in 2 months.

## 2017-07-31 ENCOUNTER — Other Ambulatory Visit: Payer: Self-pay | Admitting: Internal Medicine

## 2017-08-01 ENCOUNTER — Ambulatory Visit (HOSPITAL_COMMUNITY)
Admission: EM | Admit: 2017-08-01 | Discharge: 2017-08-01 | Disposition: A | Discharge status: Home / Self Care | Payer: 59 | Attending: Family Medicine | Admitting: Family Medicine

## 2017-08-01 ENCOUNTER — Encounter (HOSPITAL_COMMUNITY): Payer: Self-pay | Admitting: *Deleted

## 2017-08-01 DIAGNOSIS — Z79899 Other long term (current) drug therapy: Secondary | ICD-10-CM | POA: Diagnosis not present

## 2017-08-01 DIAGNOSIS — M25511 Pain in right shoulder: Secondary | ICD-10-CM | POA: Insufficient documentation

## 2017-08-01 DIAGNOSIS — J029 Acute pharyngitis, unspecified: Secondary | ICD-10-CM | POA: Insufficient documentation

## 2017-08-01 DIAGNOSIS — M7591 Shoulder lesion, unspecified, right shoulder: Secondary | ICD-10-CM | POA: Insufficient documentation

## 2017-08-01 DIAGNOSIS — W57XXXA Bitten or stung by nonvenomous insect and other nonvenomous arthropods, initial encounter: Secondary | ICD-10-CM | POA: Diagnosis not present

## 2017-08-01 DIAGNOSIS — M7581 Other shoulder lesions, right shoulder: Secondary | ICD-10-CM

## 2017-08-01 LAB — POCT RAPID STREP A: STREPTOCOCCUS, GROUP A SCREEN (DIRECT): NEGATIVE

## 2017-08-01 MED ORDER — MAGIC MOUTHWASH W/LIDOCAINE: 5.0000 mL | Freq: Three times a day (TID) | ORAL | 0 refills | Status: DC | PRN

## 2017-08-01 MED ORDER — DICLOFENAC SODIUM 75 MG PO TBEC: 75.0000 mg | DELAYED_RELEASE_TABLET | Freq: Two times a day (BID) | ORAL | 0 refills | Status: DC

## 2017-08-01 MED ORDER — DOXYCYCLINE HYCLATE 100 MG PO CAPS: 100.0000 mg | ORAL_CAPSULE | Freq: Two times a day (BID) | ORAL | 0 refills | Status: DC

## 2017-08-01 MED ORDER — POLYETHYLENE GLYCOL 3350 17 G PO PACK: 17.0000 g | PACK | Freq: Every day | ORAL | 0 refills | Status: DC

## 2017-08-01 NOTE — Discharge Instructions (Signed)
Take the medicine as prescribed, follow up with primary care as needed

## 2017-08-01 NOTE — ED Provider Notes (Signed)
[facility]   [id number] 08/01/17 Arrival Time: 1849   SUBJECTIVE:  [name] [name] is a 46 y.o. female who presents to the urgent care  with three-day chief complaints. First is sore throat with cough, congestion, runny nose. Cough is intermittent, dry, hacking. For sore throat since she has difficulty and painful swallowing. Denies fever.  Next chief complaint is right shoulder pain. Denies any injury, does work in an office setting with repetitive motion.  Third chief complaint tick bite, tried to remove the tick, headache was left in, it is been in for several weeks. She denies any rashes, but has been fatigued   ROS: As per HPI, remainder of ROS negative.   OBJECTIVE:  Vitals:   08/01/17 1912  BP: (!) 161/114  Pulse: 100  Resp: (!) 24  Temp: 98.8 F (37.1 C)  TempSrc: Oral  SpO2: 100%     General appearance: alert; no distress HEENT: normocephalic; atraumatic; conjunctivae normal; Oropharynx erythema, tonsils +2, uvula midline without evidence of abscess. Tympanic membranes pearly gray bilaterally without erythema or bulging, sinuses are nontender, she does have tender submandibular lymphadenopathy, and tonsillar lymphadenopathy Neck: Trachea midline, no JVD, cervical lymphadenopathy is present Lungs: clear to auscultation bilaterally Heart: regular rate and rhythm Abdomen: soft, non-tender; bowel sounds normal; no masses or organomegaly; no guarding or rebound tenderness Musculoskeletal: Tenderness in the right shoulder at the insertion of the biceps tendon, limited flexion and extension and also pain with abduction of the shoulder Skin: warm and dry, small pustule the center of the back with surrounding erythema Neurologic: Grossly normal Psychological:  alert and cooperative; normal mood and affect     ASSESSMENT & PLAN:  1. Viral pharyngitis   2. Tick bite, initial encounter   3. Tendinitis of right rotator cuff     Meds ordered this encounter    Medications  . doxycycline (VIBRAMYCIN) 100 MG capsule    Sig: Take 1 capsule (100 mg total) by mouth 2 (two) times daily.    Dispense:  28 capsule    Refill:  0    Order Specific Question:   Supervising Provider    Answer:   Elvina Sidle [[id number]]  . magic mouthwash w/lidocaine SOLN    Sig: Take 5 mLs by mouth 3 (three) times daily as needed for mouth pain.    Dispense:  100 mL    Refill:  0    1 Part Lidocaine, 1 Part Nystatin, 1 Part Maalox, 1 Part Benadryl    Order Specific Question:   Supervising Provider    Answer:   Elvina Sidle [[id number]]  . diclofenac (VOLTAREN) 75 MG EC tablet    Sig: Take 1 tablet (75 mg total) by mouth 2 (two) times daily.    Dispense:  20 tablet    Refill:  0    Order Specific Question:   Supervising Provider    Answer:   Elvina Sidle [[id number]]    Reviewed expectations re: course of current medical issues. Questions answered. Outlined signs and symptoms indicating need for more acute intervention. Patient verbalized understanding. After Visit Summary given.    Procedures:  Consent was obtained, skin was prepped with alcohol and 2 mL of lidocaine with epinephrine was introduced in the area of the pustule, once anesthesia was cleaned, the skin was prepped with Betadine, #11 scalpel was used to remove the pustule. Patient tolerated the procedure well, no evidence of retained foreign body, no evidence of retained tick head.   Results for orders placed  or performed during the hospital encounter of 08/01/17  POCT rapid strep A [facility])  Result Value Ref Range   Streptococcus, Group A Screen (Direct) NEGATIVE NEGATIVE    Labs Reviewed  CULTURE, GROUP A STREP [facility])  POCT RAPID STREP A    No results found.  Allergies  Allergen Reactions  . Ciprofloxacin     REACTION: gittery  . Tramadol Other (See Comments)    Headaches worse    PMHx, SurgHx, SocialHx, Medications, and Allergies were reviewed in the Visit Navigator and updated as  appropriate.       Dorena Bodo, NP 08/01/17 2000

## 2017-08-01 NOTE — ED Triage Notes (Addendum)
Started with severe sore throat today with progressive worsening.  Also c/o cough with chest pain upon coughing. Unsure if fevers.

## 2017-08-03 ENCOUNTER — Encounter: Payer: Self-pay | Admitting: Internal Medicine

## 2017-08-03 NOTE — Progress Notes (Signed)
Abstracted and sent to scan  

## 2017-08-04 ENCOUNTER — Ambulatory Visit (INDEPENDENT_AMBULATORY_CARE_PROVIDER_SITE_OTHER): Payer: 59 | Admitting: Internal Medicine

## 2017-08-04 ENCOUNTER — Encounter: Payer: Self-pay | Admitting: Internal Medicine

## 2017-08-04 VITALS — BP 134/88 | HR 96 | Temp 98.1°F | Resp 16 | Ht 64.0 in | Wt 233.0 lb

## 2017-08-04 DIAGNOSIS — J988 Other specified respiratory disorders: Secondary | ICD-10-CM | POA: Insufficient documentation

## 2017-08-04 DIAGNOSIS — R05 Cough: Secondary | ICD-10-CM

## 2017-08-04 DIAGNOSIS — R059 Cough, unspecified: Secondary | ICD-10-CM

## 2017-08-04 DIAGNOSIS — J4521 Mild intermittent asthma with (acute) exacerbation: Secondary | ICD-10-CM

## 2017-08-04 LAB — CULTURE, GROUP A STREP (THRC)

## 2017-08-04 MED ORDER — METHYLPREDNISOLONE ACETATE 80 MG/ML IJ SUSP
120.0000 mg | Freq: Once | INTRAMUSCULAR | Status: AC
  Administered 2017-08-04: 120 mg via INTRAMUSCULAR

## 2017-08-04 MED ORDER — FLUTICASONE FUROATE-VILANTEROL 200-25 MCG/INH IN AEPB: 1.0000 | INHALATION_SPRAY | Freq: Every day | RESPIRATORY_TRACT | 3 refills | Status: DC

## 2017-08-04 MED ORDER — METHYLPREDNISOLONE ACETATE 80 MG/ML IJ SUSP: 80.0000 mg | Freq: Once | INTRAMUSCULAR | Status: DC

## 2017-08-04 MED ORDER — AZITHROMYCIN 500 MG PO TABS: 500.0000 mg | ORAL_TABLET | Freq: Every day | ORAL | 0 refills | Status: DC

## 2017-08-04 NOTE — Progress Notes (Signed)
Subjective:  Patient ID: [name] [name], female    DOB: 22-Oct-1971  Age: 46 y.o. MRN: [id number]  CC: Cough   HPI [name] [name] presents for a 4 day hx of cough productive of thick yellow/green phlegm with ST, chills, SOB, wheezing, and night sweats.  Outpatient Medications Prior to Visit  Medication Sig Dispense Refill  . bromocriptine (PARLODEL) 2.5 MG tablet Take 0.5 tablets (1.25 mg total) by mouth daily. 15 tablet 11  . Canagliflozin (INVOKANA) 300 MG TABS Take 1 tablet (300 mg total) by mouth daily. 30 tablet 11  . cetirizine (ZYRTEC) 10 MG tablet Take 1 tablet (10 mg total) by mouth daily. 30 tablet 0  . chlorhexidine (PERIDEX) 0.12 % solution Use as directed 15 mLs in the mouth or throat 2 (two) times daily. 120 mL 0  . Cholecalciferol 1000 UNITS tablet Take 1,000 Units by mouth daily.      Marland Kitchen dexlansoprazole (DEXILANT) 60 MG capsule Take 1 capsule (60 mg total) by mouth every morning. For indigestion (Patient taking differently: Take 60 mg by mouth daily as needed (acid reflux). For indigestion) 30 capsule 11  . diclofenac (VOLTAREN) 75 MG EC tablet Take 1 tablet (75 mg total) by mouth 2 (two) times daily. 20 tablet 0  . Dulaglutide (TRULICITY) 1.5 MG/0.5ML SOPN Inject 1.5 mg into the skin once a week. 4 pen 11  . fluticasone (FLONASE) 50 MCG/ACT nasal spray Place 2 sprays into both nostrils daily. 16 g 0  . Insulin Pen Needle (BD PEN NEEDLE NANO U/F) 32G X 4 MM MISC Use to inject insulin 1 time per day. 100 each 5  . Lancets (FREESTYLE) lancets Use two times daily as instructed. Dx: 250.00 100 each 6  . linagliptin (TRADJENTA) 5 MG TABS tablet Take 1 tablet (5 mg total) by mouth daily. 30 tablet 11  . magic mouthwash w/lidocaine SOLN Take 5 mLs by mouth 3 (three) times daily as needed for mouth pain. 100 mL 0  . meloxicam (MOBIC) 15 MG tablet Take 1 tablet (15 mg total) by mouth daily as needed for pain. 30 tablet 2  . metFORMIN (GLUCOPHAGE) 500 MG tablet Take 2 tablets (1,000  mg total) by mouth 2 (two) times daily with a meal. 120 tablet 11  . Multiple Vitamins-Calcium (ONE-A-DAY WOMENS PO) Take 1 tablet by mouth daily.    . Olmesartan-Amlodipine-HCTZ 40-10-25 MG TABS take 1 tablet by mouth once daily 30 tablet 5  . ONETOUCH VERIO test strip TEST as directed 100 each 3  . ONETOUCH VERIO test strip TEST as directed 100 each 3  . polyethylene glycol (MIRALAX) packet Take 17 g by mouth daily. 14 each 0  . repaglinide (PRANDIN) 2 MG tablet Take 1 tablet (2 mg total) by mouth 3 (three) times daily before meals. 90 tablet 11  . Tetrahydrozoline-Zn Sulfate (EYE DROPS ALLERGY RELIEF OP) Place 1 drop into both eyes daily as needed (allergies).    Marland Kitchen doxycycline (VIBRAMYCIN) 100 MG capsule Take 1 capsule (100 mg total) by mouth 2 (two) times daily. 28 capsule 0   No facility-administered medications prior to visit.     ROS Review of Systems  Constitutional: Positive for chills. Negative for fatigue, fever and unexpected weight change.  HENT: Positive for sore throat. Negative for congestion, facial swelling, sinus pressure and trouble swallowing.   Eyes: Negative.   Respiratory: Positive for cough and wheezing. Negative for chest tightness, shortness of breath and stridor.   Cardiovascular: Negative for chest pain, palpitations  and leg swelling.  Gastrointestinal: Negative for abdominal pain, blood in stool, constipation, diarrhea, nausea and vomiting.  Endocrine: Negative.   Genitourinary: Negative.  Negative for decreased urine volume, difficulty urinating, dysuria, flank pain and urgency.  Musculoskeletal: Negative.   Skin: Negative.  Negative for color change and rash.  Allergic/Immunologic: Negative.   Neurological: Negative.   Hematological: Negative for adenopathy. Does not bruise/bleed easily.  Psychiatric/Behavioral: Negative.     Objective:  BP 134/88 (BP Location: Left Arm, Patient Position: Sitting, Cuff Size: Large)   Pulse 96   Temp 98.1 F (36.7 C)  (Oral)   Resp 16   Ht 5\' 4"  (1.626 m)   Wt 233 lb (105.7 kg)   SpO2 98%   BMI 39.99 kg/m   BP Readings from Last 3 Encounters:  08/04/17 134/88  08/01/17 (!) 161/114  07/29/17 122/74    Wt Readings from Last 3 Encounters:  08/04/17 233 lb (105.7 kg)  07/29/17 231 lb (104.8 kg)  06/15/17 234 lb 4.8 oz (106.3 kg)    Physical Exam  Constitutional: She is oriented to person, place, and time.  Non-toxic appearance. She does not have a sickly appearance. She does not appear ill. No distress.  HENT:  Mouth/Throat: Oropharynx is clear and moist and mucous membranes are normal. Mucous membranes are not dry and not cyanotic. No oral lesions. No trismus in the jaw. No uvula swelling. No oropharyngeal exudate, posterior oropharyngeal edema, posterior oropharyngeal erythema or tonsillar abscesses.  Eyes: Conjunctivae are normal. Right eye exhibits no discharge. Left eye exhibits no discharge. No scleral icterus.  Neck: Normal range of motion. Neck supple. No JVD present. No thyromegaly present.  Cardiovascular: Normal rate, regular rhythm and intact distal pulses.  Exam reveals no gallop and no friction rub.   No murmur heard. Pulmonary/Chest: Effort normal and breath sounds normal. No respiratory distress. She has no wheezes. She has no rales. She exhibits no tenderness.  Abdominal: Soft. Bowel sounds are normal. She exhibits no distension and no mass. There is no tenderness. There is no rebound and no guarding.  Musculoskeletal: Normal range of motion. She exhibits no edema or tenderness.  Lymphadenopathy:    She has no cervical adenopathy.  Neurological: She is alert and oriented to person, place, and time.  Skin: Skin is warm and dry. No rash noted. She is not diaphoretic. No erythema. No pallor.  Psychiatric: She has a normal mood and affect. Her behavior is normal. Judgment and thought content normal.  Vitals reviewed.   Lab Results  Component Value Date   WBC 9.8 11/03/2016   HGB  13.8 11/03/2016   HCT 42.1 11/03/2016   PLT 324.0 11/03/2016   GLUCOSE 110 (H) 11/03/2016   CHOL 234 (H) 11/03/2016   TRIG 100.0 11/03/2016   HDL 50.20 11/03/2016   LDLDIRECT 137.4 11/04/2010   LDLCALC 164 (H) 11/03/2016   ALT 17 11/03/2016   AST 16 11/03/2016   NA 136 11/03/2016   K 3.6 11/03/2016   CL 97 11/03/2016   CREATININE 0.83 11/03/2016   BUN 16 11/03/2016   CO2 29 11/03/2016   TSH 0.72 11/03/2016   HGBA1C 8.8 07/29/2017   MICROALBUR 53.0 (H) 01/18/2015    No results found.  Assessment & Plan:   [name] [name] was seen today for cough.  Diagnoses and all orders for this visit:  Cough- she has a remarkably elevated FeNO score, will treat for asthma -     POCT EXHALED NITRIC OXIDE  Mild intermittent asthma with  acute exacerbation- She has new onset asthma with a mild flare. I think she would benefit from starting a LABA/ICS combination inhaler as well as a course of systemic steroids. I gave her a sample of BREO and showed her how to use it. She demonstrated proficiency with its use. -     fluticasone furoate-vilanterol (BREO ELLIPTA) 200-25 MCG/INH AEPB; Inhale 1 puff into the lungs daily.  RTI (respiratory tract infection)- will treat the infection with Zithromax -     azithromycin (ZITHROMAX) 500 MG tablet; Take 1 tablet (500 mg total) by mouth daily. -     Discontinue: methylPREDNISolone acetate (DEPO-MEDROL) injection 80 mg; Inject 1 mL (80 mg total) into the muscle once. -     methylPREDNISolone acetate (DEPO-MEDROL) injection 120 mg; Inject 1.5 mLs (120 mg total) into the muscle once.   I have discontinued Ms. Shepardson's doxycycline. I am also having her start on azithromycin and fluticasone furoate-vilanterol. Additionally, I am having her maintain her Cholecalciferol, dexlansoprazole, freestyle, canagliflozin, Multiple Vitamins-Calcium (ONE-A-DAY WOMENS PO), Tetrahydrozoline-Zn Sulfate (EYE DROPS ALLERGY RELIEF OP), linagliptin, bromocriptine, metFORMIN,  chlorhexidine, cetirizine, fluticasone, repaglinide, meloxicam, Insulin Pen Needle, ONETOUCH VERIO, ONETOUCH VERIO, Dulaglutide, Olmesartan-Amlodipine-HCTZ, magic mouthwash w/lidocaine, diclofenac, and polyethylene glycol. We administered methylPREDNISolone acetate.  Meds ordered this encounter  Medications  . azithromycin (ZITHROMAX) 500 MG tablet    Sig: Take 1 tablet (500 mg total) by mouth daily.    Dispense:  3 tablet    Refill:  0  . fluticasone furoate-vilanterol (BREO ELLIPTA) 200-25 MCG/INH AEPB    Sig: Inhale 1 puff into the lungs daily.    Dispense:  90 each    Refill:  3  . DISCONTD: methylPREDNISolone acetate (DEPO-MEDROL) injection 80 mg  . methylPREDNISolone acetate (DEPO-MEDROL) injection 120 mg     Follow-up: Return in about 4 weeks (around 09/01/2017).  Sanda Linger, MD

## 2017-08-04 NOTE — Patient Instructions (Signed)

## 2017-08-05 LAB — POCT EXHALED NITRIC OXIDE: FENO LEVEL (PPB): 48

## 2017-08-10 ENCOUNTER — Ambulatory Visit: Payer: 59 | Admitting: Endocrinology

## 2017-08-10 ENCOUNTER — Telehealth: Payer: Self-pay | Admitting: Internal Medicine

## 2017-08-10 MED ORDER — BUDESONIDE-FORMOTEROL FUMARATE 160-4.5 MCG/ACT IN AERO: 2.0000 | INHALATION_SPRAY | Freq: Two times a day (BID) | RESPIRATORY_TRACT | 11 refills | Status: DC

## 2017-08-10 NOTE — Telephone Encounter (Signed)
Patient saw Dr.Jones on 8/22..   fluticasone furoate-vilanterol (BREO ELLIPTA) 200-25 MCG/INH AEPB   Patient does not believe this working well for her. She is getting it all over her mouth and does not feel like it is going into her lungs. She would to try something other than the dry power inhaler. Please advise.   Patient has an appointment with Dr.Plotnikov on 9/4

## 2017-08-10 NOTE — Telephone Encounter (Signed)
Ok Symbicort - pick up sample/Rx Thx

## 2017-08-11 NOTE — Telephone Encounter (Signed)
Pt notified yesterday.

## 2017-08-17 ENCOUNTER — Encounter: Payer: Self-pay | Admitting: Internal Medicine

## 2017-08-17 ENCOUNTER — Other Ambulatory Visit (INDEPENDENT_AMBULATORY_CARE_PROVIDER_SITE_OTHER): Payer: 59

## 2017-08-17 ENCOUNTER — Ambulatory Visit (INDEPENDENT_AMBULATORY_CARE_PROVIDER_SITE_OTHER): Payer: 59 | Admitting: Internal Medicine

## 2017-08-17 VITALS — BP 126/78 | HR 71 | Temp 98.2°F | Ht 64.0 in | Wt 231.0 lb

## 2017-08-17 DIAGNOSIS — E119 Type 2 diabetes mellitus without complications: Secondary | ICD-10-CM

## 2017-08-17 DIAGNOSIS — I10 Essential (primary) hypertension: Secondary | ICD-10-CM | POA: Diagnosis not present

## 2017-08-17 DIAGNOSIS — E559 Vitamin D deficiency, unspecified: Secondary | ICD-10-CM | POA: Diagnosis not present

## 2017-08-17 DIAGNOSIS — M25519 Pain in unspecified shoulder: Secondary | ICD-10-CM | POA: Insufficient documentation

## 2017-08-17 DIAGNOSIS — M25511 Pain in right shoulder: Secondary | ICD-10-CM

## 2017-08-17 DIAGNOSIS — J4521 Mild intermittent asthma with (acute) exacerbation: Secondary | ICD-10-CM

## 2017-08-17 DIAGNOSIS — E785 Hyperlipidemia, unspecified: Secondary | ICD-10-CM

## 2017-08-17 DIAGNOSIS — G8929 Other chronic pain: Secondary | ICD-10-CM

## 2017-08-17 LAB — BASIC METABOLIC PANEL
BUN: 15 mg/dL (ref 6–23)
CALCIUM: 9.4 mg/dL (ref 8.4–10.5)
CO2: 31 mEq/L (ref 19–32)
CREATININE: 0.81 mg/dL (ref 0.40–1.20)
Chloride: 99 mEq/L (ref 96–112)
GFR: 97.61 mL/min (ref >60.00)
Glucose, Bld: 200 mg/dL — ABNORMAL HIGH (ref 70–99)
Potassium: 4 mEq/L (ref 3.5–5.1)
Sodium: 137 mEq/L (ref 135–145)

## 2017-08-17 LAB — LIPID PANEL
CHOLESTEROL: 230 mg/dL — AB (ref 0–200)
HDL: 56.1 mg/dL (ref >39.00)
LDL CALC: 159 mg/dL — AB (ref 0–99)
NonHDL: 174.33
Total CHOL/HDL Ratio: 4
Triglycerides: 78 mg/dL (ref 0.0–149.0)
VLDL: 15.6 mg/dL (ref 0.0–40.0)

## 2017-08-17 LAB — CBC WITH DIFFERENTIAL/PLATELET
BASOS ABS: 0.2 10*3/uL — AB (ref 0.0–0.1)
Basophils Relative: 1.1 % (ref 0.0–3.0)
EOS ABS: 0.1 10*3/uL (ref 0.0–0.7)
Eosinophils Relative: 0.9 % (ref 0.0–5.0)
HEMATOCRIT: 42.1 % (ref 36.0–46.0)
HEMOGLOBIN: 13.8 g/dL (ref 12.0–15.0)
LYMPHS PCT: 24.4 % (ref 12.0–46.0)
Lymphs Abs: 3.5 10*3/uL (ref 0.7–4.0)
MCHC: 32.8 g/dL (ref 30.0–36.0)
MCV: 87.1 fl (ref 78.0–100.0)
MONO ABS: 0.9 10*3/uL (ref 0.1–1.0)
Monocytes Relative: 6.7 % (ref 3.0–12.0)
NEUTROS ABS: 9.5 10*3/uL — AB (ref 1.4–7.7)
Neutrophils Relative %: 66.9 % (ref 43.0–77.0)
PLATELETS: 326 10*3/uL (ref 150.0–400.0)
RBC: 4.84 Mil/uL (ref 3.87–5.11)
RDW: 14 % (ref 11.5–15.5)
WBC: 14.2 10*3/uL — ABNORMAL HIGH (ref 4.0–10.5)

## 2017-08-17 LAB — URINALYSIS, ROUTINE W REFLEX MICROSCOPIC
Bilirubin Urine: NEGATIVE
HGB URINE DIPSTICK: NEGATIVE
KETONES UR: NEGATIVE
LEUKOCYTES UA: NEGATIVE
NITRITE: NEGATIVE
RBC / HPF: NONE SEEN (ref 0–?)
Specific Gravity, Urine: 1.02 (ref 1.000–1.030)
TOTAL PROTEIN, URINE-UPE24: 100 — AB
URINE GLUCOSE: NEGATIVE
UROBILINOGEN UA: 0.2 (ref 0.0–1.0)
pH: 6.5 (ref 5.0–8.0)

## 2017-08-17 LAB — HEPATIC FUNCTION PANEL
ALT: 19 U/L (ref 0–35)
AST: 13 U/L (ref 0–37)
Albumin: 4.2 g/dL (ref 3.5–5.2)
Alkaline Phosphatase: 75 U/L (ref 39–117)
BILIRUBIN DIRECT: 0.1 mg/dL (ref 0.0–0.3)
BILIRUBIN TOTAL: 0.5 mg/dL (ref 0.2–1.2)
Total Protein: 7.6 g/dL (ref 6.0–8.3)

## 2017-08-17 LAB — TSH: TSH: 2.16 u[IU]/mL (ref 0.35–4.50)

## 2017-08-17 LAB — MICROALBUMIN / CREATININE URINE RATIO
Creatinine,U: 163.5 mg/dL
MICROALB UR: 48.4 mg/dL — AB (ref 0.0–1.9)
Microalb Creat Ratio: 29.6 mg/g (ref 0.0–30.0)

## 2017-08-17 LAB — VITAMIN B12: VITAMIN B 12: 800 pg/mL (ref 211–911)

## 2017-08-17 NOTE — Assessment & Plan Note (Addendum)
Subacr bursitis/rot cuff pain ROM exercises NSAIDs prn Sports med ref - Dr Katrinka BlazingSmith

## 2017-08-17 NOTE — Assessment & Plan Note (Signed)
Labs

## 2017-08-17 NOTE — Assessment & Plan Note (Signed)
  Olmesart-Amlodipine  

## 2017-08-17 NOTE — Progress Notes (Signed)
Subjective:  Patient ID: [name] [name], female    DOB: Apr 22, 1971  Age: 46 y.o. MRN: [id number]  CC: No chief complaint on file.   HPI [name] [name] presents for bronchitis f/u - better. F/u DM C/o R shoulder pain - x months  Outpatient Medications Prior to Visit  Medication Sig Dispense Refill  . azithromycin (ZITHROMAX) 500 MG tablet Take 1 tablet (500 mg total) by mouth daily. 3 tablet 0  . bromocriptine (PARLODEL) 2.5 MG tablet Take 0.5 tablets (1.25 mg total) by mouth daily. 15 tablet 11  . budesonide-formoterol (SYMBICORT) 160-4.5 MCG/ACT inhaler Inhale 2 puffs into the lungs 2 (two) times daily. 1 Inhaler 11  . Canagliflozin (INVOKANA) 300 MG TABS Take 1 tablet (300 mg total) by mouth daily. 30 tablet 11  . cetirizine (ZYRTEC) 10 MG tablet Take 1 tablet (10 mg total) by mouth daily. 30 tablet 0  . chlorhexidine (PERIDEX) 0.12 % solution Use as directed 15 mLs in the mouth or throat 2 (two) times daily. 120 mL 0  . Cholecalciferol 1000 UNITS tablet Take 1,000 Units by mouth daily.      Marland Kitchen dexlansoprazole (DEXILANT) 60 MG capsule Take 1 capsule (60 mg total) by mouth every morning. For indigestion (Patient taking differently: Take 60 mg by mouth daily as needed (acid reflux). For indigestion) 30 capsule 11  . diclofenac (VOLTAREN) 75 MG EC tablet Take 1 tablet (75 mg total) by mouth 2 (two) times daily. 20 tablet 0  . Dulaglutide (TRULICITY) 1.5 MG/0.5ML SOPN Inject 1.5 mg into the skin once a week. 4 pen 11  . fluticasone (FLONASE) 50 MCG/ACT nasal spray Place 2 sprays into both nostrils daily. 16 g 0  . Insulin Pen Needle (BD PEN NEEDLE NANO U/F) 32G X 4 MM MISC Use to inject insulin 1 time per day. 100 each 5  . Lancets (FREESTYLE) lancets Use two times daily as instructed. Dx: 250.00 100 each 6  . linagliptin (TRADJENTA) 5 MG TABS tablet Take 1 tablet (5 mg total) by mouth daily. 30 tablet 11  . magic mouthwash w/lidocaine SOLN Take 5 mLs by mouth 3 (three) times daily as  needed for mouth pain. 100 mL 0  . meloxicam (MOBIC) 15 MG tablet Take 1 tablet (15 mg total) by mouth daily as needed for pain. 30 tablet 2  . metFORMIN (GLUCOPHAGE) 500 MG tablet Take 2 tablets (1,000 mg total) by mouth 2 (two) times daily with a meal. 120 tablet 11  . Multiple Vitamins-Calcium (ONE-A-DAY WOMENS PO) Take 1 tablet by mouth daily.    . Olmesartan-Amlodipine-HCTZ 40-10-25 MG TABS take 1 tablet by mouth once daily 30 tablet 5  . ONETOUCH VERIO test strip TEST as directed 100 each 3  . ONETOUCH VERIO test strip TEST as directed 100 each 3  . polyethylene glycol (MIRALAX) packet Take 17 g by mouth daily. 14 each 0  . repaglinide (PRANDIN) 2 MG tablet Take 1 tablet (2 mg total) by mouth 3 (three) times daily before meals. 90 tablet 11  . Tetrahydrozoline-Zn Sulfate (EYE DROPS ALLERGY RELIEF OP) Place 1 drop into both eyes daily as needed (allergies).     No facility-administered medications prior to visit.     ROS Review of Systems  Constitutional: Negative for activity change, appetite change, chills, fatigue and unexpected weight change.  HENT: Negative for congestion, mouth sores and sinus pressure.   Eyes: Negative for visual disturbance.  Respiratory: Negative for cough and chest tightness.   Gastrointestinal: Negative  for abdominal pain and nausea.  Genitourinary: Negative for difficulty urinating, frequency and vaginal pain.  Musculoskeletal: Positive for arthralgias. Negative for back pain and gait problem.  Skin: Negative for pallor and rash.  Neurological: Negative for dizziness, tremors, weakness, numbness and headaches.  Psychiatric/Behavioral: Negative for confusion and sleep disturbance.    Objective:  BP 126/78 (BP Location: Left Arm, Patient Position: Sitting, Cuff Size: Large)   Pulse 71   Temp 98.2 F (36.8 C) (Oral)   Ht 5\' 4"  (1.626 m)   Wt 231 lb (104.8 kg)   SpO2 100%   BMI 39.65 kg/m   BP Readings from Last 3 Encounters:  08/17/17 126/78    08/04/17 134/88  08/01/17 (!) 161/114    Wt Readings from Last 3 Encounters:  08/17/17 231 lb (104.8 kg)  08/04/17 233 lb (105.7 kg)  07/29/17 231 lb (104.8 kg)    Physical Exam  Constitutional: She appears well-developed. No distress.  HENT:  Head: Normocephalic.  Right Ear: External ear normal.  Left Ear: External ear normal.  Nose: Nose normal.  Mouth/Throat: Oropharynx is clear and moist.  Eyes: Pupils are equal, round, and reactive to light. Conjunctivae are normal. Right eye exhibits no discharge. Left eye exhibits no discharge.  Neck: Normal range of motion. Neck supple. No JVD present. No tracheal deviation present. No thyromegaly present.  Cardiovascular: Normal rate, regular rhythm and normal heart sounds.   Pulmonary/Chest: No stridor. No respiratory distress. She has no wheezes.  Abdominal: Soft. Bowel sounds are normal. She exhibits no distension and no mass. There is no tenderness. There is no rebound and no guarding.  Musculoskeletal: She exhibits tenderness. She exhibits no edema.  Lymphadenopathy:    She has no cervical adenopathy.  Neurological: She displays normal reflexes. No cranial nerve deficit. She exhibits normal muscle tone. Coordination normal.  Skin: No rash noted. No erythema.  Psychiatric: She has a normal mood and affect. Her behavior is normal. Judgment and thought content normal.  R shoulder tender in the subacr region and w/ROM  Lab Results  Component Value Date   WBC 9.8 11/03/2016   HGB 13.8 11/03/2016   HCT 42.1 11/03/2016   PLT 324.0 11/03/2016   GLUCOSE 110 (H) 11/03/2016   CHOL 234 (H) 11/03/2016   TRIG 100.0 11/03/2016   HDL 50.20 11/03/2016   LDLDIRECT 137.4 11/04/2010   LDLCALC 164 (H) 11/03/2016   ALT 17 11/03/2016   AST 16 11/03/2016   NA 136 11/03/2016   K 3.6 11/03/2016   CL 97 11/03/2016   CREATININE 0.83 11/03/2016   BUN 16 11/03/2016   CO2 29 11/03/2016   TSH 0.72 11/03/2016   HGBA1C 8.8 07/29/2017   MICROALBUR  53.0 (H) 01/18/2015    No results found.  Assessment & Plan:   There are no diagnoses linked to this encounter. I am having Ms. Cirilo maintain her Cholecalciferol, dexlansoprazole, freestyle, canagliflozin, Multiple Vitamins-Calcium (ONE-A-DAY WOMENS PO), Tetrahydrozoline-Zn Sulfate (EYE DROPS ALLERGY RELIEF OP), linagliptin, bromocriptine, metFORMIN, chlorhexidine, cetirizine, fluticasone, repaglinide, meloxicam, Insulin Pen Needle, ONETOUCH VERIO, ONETOUCH VERIO, Dulaglutide, Olmesartan-Amlodipine-HCTZ, magic mouthwash w/lidocaine, diclofenac, polyethylene glycol, azithromycin, and budesonide-formoterol.  No orders of the defined types were placed in this encounter.    Follow-up: No Follow-up on file.  Sonda PrimesAlex Adella Manolis, MD

## 2017-08-17 NOTE — Assessment & Plan Note (Signed)
On symbicort

## 2017-08-17 NOTE — Assessment & Plan Note (Addendum)
Labs Trulicity. Metformin

## 2017-08-23 DIAGNOSIS — M9901 Segmental and somatic dysfunction of cervical region: Secondary | ICD-10-CM | POA: Diagnosis not present

## 2017-08-23 DIAGNOSIS — M9902 Segmental and somatic dysfunction of thoracic region: Secondary | ICD-10-CM | POA: Diagnosis not present

## 2017-08-23 DIAGNOSIS — M5032 Other cervical disc degeneration, mid-cervical region, unspecified level: Secondary | ICD-10-CM | POA: Diagnosis not present

## 2017-08-31 NOTE — Progress Notes (Signed)
[name] [address] [place], [place] [address] Phone: [phone] Subjective:    I'm seeing this patient by the request  of:  Plotnikov, Georgina Quint, MD  CC: Right shoulder pain  [id number]  [name] [name] is a 46 y.o. female coming in with complaint of right shoulder pain. Chronic issue. Pain is intermittent depending on movements. She notes overhead motions and anything that requires her to hold onto weight. She also notes driving or raising her arm into forward flexion as a motion that causes pain.   Onset- chronic Location- right shoulder Duration-  intermittent Character- can be sharp, otherwise dull Aggravating factors-overhead motions Reliving factors- not moving arm overhead Therapies tried- none Severity-8 out of 10 still able to do daily activities.     Past Medical History:  Diagnosis Date  . Anemia, iron deficiency   . Chronic headache    Dr Vela Prose, Neg MRI 2005  . Diabetes mellitus   . Elevated glucose 2011  . GERD (gastroesophageal reflux disease)   . HTN (hypertension)    Past Surgical History:  Procedure Laterality Date  . ABDOMINAL HYSTERECTOMY    . BREAST REDUCTION SURGERY    . Cyst removed from vagina    . INDUCED ABORTION  1990-1994   x 4  . NOSE SURGERY    . PARTIAL HYSTERECTOMY  2010  . TUBAL LIGATION     Social History   Social History  . Marital status: Single    Spouse name: N/A  . Number of children: 2  . Years of education: N/A   Social History Main Topics  . Smoking status: Never Smoker  . Smokeless tobacco: Never Used  . Alcohol use 0.0 oz/week     Comment: occasionally  . Drug use: No  . Sexual activity: Not Asked   Other Topics Concern  . None   Social History Narrative   Regular exercise - YES   Allergies  Allergen Reactions  . Ciprofloxacin     REACTION: gittery  . Tramadol Other (See Comments)    Headaches worse   Family History  Problem Relation Age of Onset  .  Hypertension Mother   . Cancer Mother 67       Esophageal  . Hypertension Maternal Grandmother      Past medical history, social, surgical and family history all reviewed in electronic medical record.  No pertanent information unless stated regarding to the chief complaint.   Review of Systems:Review of systems updated and as accurate as of 09/01/17  No headache, visual changes, nausea, vomiting, diarrhea, constipation, dizziness, abdominal pain, skin rash, fevers, chills, night sweats, weight loss, swollen lymph nodes, body aches, joint swelling, chest pain, shortness of breath, mood changes. Positive muscle aches  Objective  Blood pressure 122/78, pulse 71, height  (1.6 m), weight 228 lb (103.4 kg), SpO2 92 %. Systems examined below as of 09/01/17   General: No apparent distress alert and oriented x3 mood and affect normal, dressed appropriately.  HEENT: Pupils equal, extraocular movements intact  Respiratory: Patient's speak in full sentences and does not appear short of breath  Cardiovascular: No lower extremity edema, non tender, no erythema  Skin: Warm dry intact with no signs of infection or rash on extremities or on axial skeleton.  Abdomen: Soft nontender  Neuro: Cranial nerves II through XII are intact, neurovascularly intact in all extremities with 2+ DTRs and 2+ pulses.  Lymph: No lymphadenopathy of posterior or anterior cervical chain or  axillae bilaterally.  Gait normal with good balance and coordination.  MSK:  Non tender with full range of motion and good stability and symmetric strength and tone of elbows, wrist, hip, knee and ankles bilaterally.  Shoulder: Right Inspection reveals no abnormalities, atrophy or asymmetry. Palpation is normal with no tenderness over AC joint or bicipital groove. ROM is full in all planes passively. Rotator cuff strength normal throughout. signs of impingement with positive Neer and Hawkin's tests, but negative empty can  sign. Speeds and Yergason's tests normal. No labral pathology noted with negative Obrien's, negative clunk and good stability. Normal scapular function observed. No painful arc and no drop arm sign. No apprehension sign  MSK US performed of: Right This study was ordered, performed, and interpreted by Terrilee Files D.O.  Shoulder:   Supraspinatus:  Appears normal on long and transverse views, Bursal bulge seen with shoulder abduction on impingement view. Infraspinatus:  Appears normal on long and transverse views. Significant increase in Doppler flow Subscapularis:  Appears normal on long and transverse views. Positive bursa Teres Minor:  Appears normal on long and transverse views. AC joint:  Capsule undistended, no geyser sign. Glenohumeral Joint:  Appears normal without effusion. Glenoid Labrum:  Intact without visualized tears. Biceps Tendon:  Appears normal on long and transverse views, no fraying of tendon, tendon located in intertubercular groove, no subluxation with shoulder internal or external rotation.  Impression: Subacromial bursitis, mild  Procedure 97110; 15 additional minutes spent for Therapeutic exercises as stated in above notes.  This included exercises focusing on stretching, strengthening, with significant focus on eccentric aspects.   Long term goals include an improvement in range of motion, strength, endurance as well as avoiding reinjury. Patient's frequency would include in 1-2 times a day, 3-5 times a week for a duration of 6-12 weeks. Shoulder Exercises that included:  Basic scapular stabilization to include adduction and depression of scapula Scaption, focusing on proper movement and good control Internal and External rotation utilizing a theraband, with elbow tucked at side entire time Rows with theraband which was given   Proper technique shown and discussed handout in great detail with ATC.  All questions were discussed and answered.      Impression and  Recommendations:     This case required medical decision making of moderate complexity.      Note: This dictation was prepared with Dragon dictation along with smaller phrase technology. Any transcriptional errors that result from this process are unintentional.

## 2017-09-01 ENCOUNTER — Encounter: Payer: Self-pay | Admitting: Family Medicine

## 2017-09-01 ENCOUNTER — Ambulatory Visit: Payer: Self-pay

## 2017-09-01 ENCOUNTER — Ambulatory Visit (INDEPENDENT_AMBULATORY_CARE_PROVIDER_SITE_OTHER): Payer: 59 | Admitting: Family Medicine

## 2017-09-01 VITALS — BP 122/78 | HR 71 | Ht 63.0 in | Wt 228.0 lb

## 2017-09-01 DIAGNOSIS — M7551 Bursitis of right shoulder: Secondary | ICD-10-CM | POA: Diagnosis not present

## 2017-09-01 DIAGNOSIS — M25511 Pain in right shoulder: Principal | ICD-10-CM

## 2017-09-01 DIAGNOSIS — G8929 Other chronic pain: Secondary | ICD-10-CM | POA: Diagnosis not present

## 2017-09-01 MED ORDER — DICLOFENAC SODIUM 2 % TD SOLN: 2.0000 "application " | Freq: Two times a day (BID) | TRANSDERMAL | 3 refills | Status: DC

## 2017-09-01 NOTE — Assessment & Plan Note (Signed)
Very mild shoulder bursitis noted today. Topical anti-inflammatories given, discussed icing regimen and home exercises. Patient attempted to give a family paperwork. We declined to fill this out. Did give some mild limitations of lifting at work for the next 3 weeks. Patient will do topical anti-inflammatories, icing regimen, and over-the-counter medications. Patient will follow-up with me again in 3-4 weeks and if worsening symptoms consider injection and formal physical therapy.

## 2017-09-01 NOTE — Patient Instructions (Signed)
Good to see you.  Ice 20 minutes 2 times daily. Usually after activity and before bed. Exercises 3 times a week.  pennsaid pinkie amount topically 2 times daily as needed.  Over the counter get  Vitamin D 2000 IU daily  Turmeric  daily  Tart cherry extract any dose at night See me again in 4 weeks.

## 2017-09-07 DIAGNOSIS — L218 Other seborrheic dermatitis: Secondary | ICD-10-CM | POA: Diagnosis not present

## 2017-09-07 DIAGNOSIS — L858 Other specified epidermal thickening: Secondary | ICD-10-CM | POA: Diagnosis not present

## 2017-09-07 DIAGNOSIS — L2089 Other atopic dermatitis: Secondary | ICD-10-CM | POA: Diagnosis not present

## 2017-09-28 ENCOUNTER — Ambulatory Visit: Payer: 59 | Admitting: Endocrinology

## 2017-09-29 ENCOUNTER — Ambulatory Visit: Payer: 59 | Admitting: Family Medicine

## 2017-10-12 ENCOUNTER — Encounter: Payer: Self-pay | Admitting: Family Medicine

## 2017-10-12 ENCOUNTER — Ambulatory Visit (INDEPENDENT_AMBULATORY_CARE_PROVIDER_SITE_OTHER): Payer: 59 | Admitting: Family Medicine

## 2017-10-12 ENCOUNTER — Ambulatory Visit: Payer: Self-pay

## 2017-10-12 VITALS — BP 148/118 | HR 81 | Ht 64.0 in | Wt 235.0 lb

## 2017-10-12 DIAGNOSIS — M25511 Pain in right shoulder: Secondary | ICD-10-CM | POA: Diagnosis not present

## 2017-10-12 DIAGNOSIS — M7551 Bursitis of right shoulder: Secondary | ICD-10-CM

## 2017-10-12 NOTE — Assessment & Plan Note (Signed)
Patient given injection today. Tolerated the procedure well. We discussed icing regimen and home exercises. Discussed objective is doing which ones to avoid. Increase activity as tolerated. Follow-up with me again in 4-6 weeks. Worsening symptoms consider advanced imaging.

## 2017-10-12 NOTE — Progress Notes (Signed)
[name] [address] [place], [place] [address] Phone: [phone] Subjective:    I'm seeing this patient by the request  of:    CC: Right shoulder pain follow-up  [id number]  [name] [name] is a 46 y.o. female coming in for follow up for right shoulder pain. Her shoulder is popping and painful with nonspecific motions. Denies a new injury and numbness and tingling. Patient was having worsening pain. Patient states that is having audible popping. Seems to be worsening. Patient is waking her up at night. Does not responding to the conservative therapy.       Past Medical History:  Diagnosis Date  . Anemia, iron deficiency   . Chronic headache    Dr Vela Prose, Neg MRI 2005  . Diabetes mellitus   . Elevated glucose 2011  . GERD (gastroesophageal reflux disease)   . HTN (hypertension)    Past Surgical History:  Procedure Laterality Date  . ABDOMINAL HYSTERECTOMY    . BREAST REDUCTION SURGERY    . Cyst removed from vagina    . INDUCED ABORTION  1990-1994   x 4  . NOSE SURGERY    . PARTIAL HYSTERECTOMY  2010  . TUBAL LIGATION     Social History   Social History  . Marital status: Single    Spouse name: N/A  . Number of children: 2  . Years of education: N/A   Social History Main Topics  . Smoking status: Never Smoker  . Smokeless tobacco: Never Used  . Alcohol use 0.0 oz/week     Comment: occasionally  . Drug use: No  . Sexual activity: Not on file   Other Topics Concern  . Not on file   Social History Narrative   Regular exercise - YES   Allergies  Allergen Reactions  . Ciprofloxacin     REACTION: gittery  . Tramadol Other (See Comments)    Headaches worse   Family History  Problem Relation Age of Onset  . Hypertension Mother   . Cancer Mother 71       Esophageal  . Hypertension Maternal Grandmother      Past medical history, social, surgical and family history all reviewed in electronic medical record.  No  pertanent information unless stated regarding to the chief complaint.   Review of Systems:Review of systems updated and as accurate as of 10/12/17  No headache, visual changes, nausea, vomiting, diarrhea, constipation, dizziness, abdominal pain, skin rash, fevers, chills, night sweats, weight loss, swollen lymph nodes, body aches, joint swelling, muscle aches, chest pain, shortness of breath, mood changes.   Objective  There were no vitals taken for this visit. Systems examined below as of 10/12/17   General: No apparent distress alert and oriented x3 mood and affect normal, dressed appropriately.  HEENT: Pupils equal, extraocular movements intact  Respiratory: Patient's speak in full sentences and does not appear short of breath  Cardiovascular: No lower extremity edema, non tender, no erythema  Skin: Warm dry intact with no signs of infection or rash on extremities or on axial skeleton.  Abdomen: Soft nontender  Neuro: Cranial nerves II through XII are intact, neurovascularly intact in all extremities with 2+ DTRs and 2+ pulses.  Lymph: No lymphadenopathy of posterior or anterior cervical chain or axillae bilaterally.  Gait normal with good balance and coordination.  MSK:  Non tender with full range of motion and good stability and symmetric strength and tone of elbows, wrist, hip, knee and ankles  bilaterally.  Shoulder: Right Inspection reveals no abnormalities, atrophy or asymmetry. Palpation is normal with no tenderness over AC joint or bicipital groove. ROM is full in all planes passively. Rotator cuff strength normal throughout. signs of impingement with positive Neer and Hawkin's tests, but negative empty can sign. Speeds and Yergason's tests normal. No labral pathology noted with negative Obrien's, negative clunk and good stability. Normal scapular function observed. No painful arc and no drop arm sign. No apprehension sign Contralateral shoulder unremarkable  After informed  written and verbal consent, patient was seated on exam table. Right shoulder was prepped with alcohol swab and utilizing posterior approach, patient's right glenohumeral space was injected with 4:1  marcaine 0.5%: Kenalog 40mg /dL. Patient tolerated the procedure well without immediate complications.   Impression and Recommendations:     This case required medical decision making of moderate complexity.      Note: This dictation was prepared with Dragon dictation along with smaller phrase technology. Any transcriptional errors that result from this process are unintentional.

## 2017-10-12 NOTE — Patient Instructions (Signed)
Good to see you  Ice 20 minutes 2 times daily. Usually after activity and before bed. Exercises 3 times a week. Start in 4 weeks.  pennsaid pinkie amount topically 2 times daily as needed.   Keep hands in peripheral vision.  See me again in 4 weeks.

## 2017-11-08 NOTE — Progress Notes (Signed)
[name] [name] D.O. [facility] [address] [place], [place] [address] Phone: [phone] Subjective:    I'm seeing this patient by the request  of:    CC: right shoulder pain   FAO:[id number]:Subjective  [name] [name] is a 46 y.o. female coming in with complaint of  Right shoulder pain.  Was found to have more of a shoulder bursitis.  Given injection on October 12, 2017 and was to do conservative therapy including home exercise, topical anti-inflammatories and icing regimen.  Patient states doing approximately 80% better.  Not have any significant pain.  Still just with certain range of motion having pain.      Past Medical History:  Diagnosis Date  . Anemia, iron deficiency   . Chronic headache    Dr Vela ProseLewitt, Neg MRI 2005  . Diabetes mellitus   . Elevated glucose 2011  . GERD (gastroesophageal reflux disease)   . HTN (hypertension)    Past Surgical History:  Procedure Laterality Date  . ABDOMINAL HYSTERECTOMY    . BREAST REDUCTION SURGERY    . Cyst removed from vagina    . INDUCED ABORTION  1990-1994   x 4  . NOSE SURGERY    . PARTIAL HYSTERECTOMY  2010  . TUBAL LIGATION     Social History   Socioeconomic History  . Marital status: Single    Spouse name: Not on file  . Number of children: 2  . Years of education: Not on file  . Highest education level: Not on file  Social Needs  . Financial resource strain: Not on file  . Food insecurity - worry: Not on file  . Food insecurity - inability: Not on file  . Transportation needs - medical: Not on file  . Transportation needs - non-medical: Not on file  Occupational History  . Not on file  Tobacco Use  . Smoking status: Never Smoker  . Smokeless tobacco: Never Used  Substance and Sexual Activity  . Alcohol use: Yes    Alcohol/week: 0.0 oz    Comment: occasionally  . Drug use: No  . Sexual activity: Not on file  Other Topics Concern  . Not on file  Social History Narrative   Regular exercise - YES    Allergies  Allergen Reactions  . Ciprofloxacin     REACTION: gittery  . Tramadol Other (See Comments)    Headaches worse   Family History  Problem Relation Age of Onset  . Hypertension Mother   . Cancer Mother [id number]       Esophageal  . Hypertension Maternal Grandmother      Past medical history, social, surgical and family history all reviewed in electronic medical record.  No pertanent information unless stated regarding to the chief complaint.   Review of Systems:Review of systems updated and as accurate as of 11/09/17  No headache, visual changes, nausea, vomiting, diarrhea, constipation, dizziness, abdominal pain, skin rash, fevers, chills, night sweats, weight loss, swollen lymph nodes, body aches, joint swelling, muscle aches, chest pain, shortness of breath, mood changes.   Objective  Blood pressure 120/90, height 5\' 4"  (1.626 m), weight 232 lb (105.2 kg). Systems examined below as of 11/09/17   General: No apparent distress alert and oriented x3 mood and affect normal, dressed appropriately.  HEENT: Pupils equal, extraocular movements intact  Respiratory: Patient's speak in full sentences and does not appear short of breath  Cardiovascular: No lower extremity edema, non tender, no erythema  Skin: Warm dry intact with  no signs of infection or rash on extremities or on axial skeleton.  Abdomen: Soft nontender  Neuro: Cranial nerves II through XII are intact, neurovascularly intact in all extremities with 2+ DTRs and 2+ pulses.  Lymph: No lymphadenopathy of posterior or anterior cervical chain or axillae bilaterally.  Gait normal with good balance and coordination.  MSK:  Non tender with full range of motion and good stability and symmetric strength and tone of elbows, wrist, hip, knee and ankles bilaterally.   Shoulder: Right Inspection reveals no abnormalities, atrophy or asymmetry. Palpation is normal with no tenderness over AC joint or bicipital groove. ROM is full in  all planes. Rotator cuff strength normal throughout. Mild impingement Speeds and Yergason's tests normal. No labral pathology noted with negative Obrien's, negative clunk and good stability. Normal scapular function observed. No painful arc and no drop arm sign. No apprehension sign Contralateral shoulder unremarkable      Impression and Recommendations:     This case required medical decision making of moderate complexity.      Note: This dictation was prepared with Dragon dictation along with smaller phrase technology. Any transcriptional errors that result from this process are unintentional.

## 2017-11-09 ENCOUNTER — Ambulatory Visit: Payer: 59 | Admitting: Family Medicine

## 2017-11-09 ENCOUNTER — Encounter: Payer: Self-pay | Admitting: Family Medicine

## 2017-11-09 DIAGNOSIS — M7551 Bursitis of right shoulder: Secondary | ICD-10-CM | POA: Diagnosis not present

## 2017-11-09 NOTE — Patient Instructions (Addendum)
Good to see you Ice is your friend Exercises 3 times a week.  As long as you do well call me when you need me [phone]

## 2017-11-09 NOTE — Assessment & Plan Note (Signed)
Patient is 80% better on follow-up at this time.  We discussed icing regimen and home exercise.  We discussed which activities to do which wants to avoid.  No need for further advanced imaging or possible formal physical therapy at this time.  Worsening symptoms we will reconsider.  Follow-up again as needed

## 2017-11-23 ENCOUNTER — Ambulatory Visit: Payer: Self-pay | Admitting: Endocrinology

## 2017-11-25 ENCOUNTER — Ambulatory Visit: Payer: 59 | Admitting: Family Medicine

## 2017-11-25 ENCOUNTER — Encounter: Payer: Self-pay | Admitting: Family Medicine

## 2017-11-25 VITALS — BP 132/74 | HR 98 | Temp 98.4°F | Ht 64.0 in | Wt 227.0 lb

## 2017-11-25 DIAGNOSIS — R059 Cough, unspecified: Secondary | ICD-10-CM

## 2017-11-25 DIAGNOSIS — R05 Cough: Secondary | ICD-10-CM

## 2017-11-25 MED ORDER — BENZONATATE 100 MG PO CAPS: 100.0000 mg | ORAL_CAPSULE | Freq: Two times a day (BID) | ORAL | 0 refills | Status: DC | PRN

## 2017-11-25 NOTE — Progress Notes (Signed)
[name] [name] - 46 y.o. female MRN [id number]  Date of birth: 04/19/1971  SUBJECTIVE:  Including CC & ROS.  Chief Complaint  Patient presents with  . Cough   [name] [name] is a 46 y.o. female that is here for an evaluation for a cough. It has been ongoing since 11/20/17.  She took  nyquil and dayquil which did not improve her cough. Denies fevers or chills. She feels like she is short of breath. She is having a yellow-green sputum production with the cough. Cough is worse at night. Reports she has a distant history of asthma. The symptoms seem to be waxing and waning. Has received the flu vaccine. Denies any body aches.   Review of Systems  Constitutional: Negative for fever.  HENT: Positive for rhinorrhea and sinus pressure. Negative for ear pain and voice change.   Respiratory: Positive for cough. Negative for shortness of breath.     HISTORY: Past Medical, Surgical, Social, and Family History Reviewed & Updated per EMR.   Pertinent Historical Findings include:  Past Medical History:  Diagnosis Date  . Anemia, iron deficiency   . Chronic headache    Dr Vela ProseLewitt, Neg MRI 2005  . Diabetes mellitus   . Elevated glucose 2011  . GERD (gastroesophageal reflux disease)   . HTN (hypertension)     Past Surgical History:  Procedure Laterality Date  . ABDOMINAL HYSTERECTOMY    . BREAST REDUCTION SURGERY    . Cyst removed from vagina    . INDUCED ABORTION  1990-1994   x 4  . NOSE SURGERY    . PARTIAL HYSTERECTOMY  2010  . TUBAL LIGATION      Allergies  Allergen Reactions  . Ciprofloxacin     REACTION: gittery  . Tramadol Other (See Comments)    Headaches worse    Family History  Problem Relation Age of Onset  . Hypertension Mother   . Cancer Mother 959       Esophageal  . Hypertension Maternal Grandmother      Social History   Socioeconomic History  . Marital status: Single    Spouse name: Not on file  . Number of children: 2  . Years of education: Not on file    . Highest education level: Not on file  Social Needs  . Financial resource strain: Not on file  . Food insecurity - worry: Not on file  . Food insecurity - inability: Not on file  . Transportation needs - medical: Not on file  . Transportation needs - non-medical: Not on file  Occupational History  . Not on file  Tobacco Use  . Smoking status: Never Smoker  . Smokeless tobacco: Never Used  Substance and Sexual Activity  . Alcohol use: Yes    Alcohol/week: 0.0 oz    Comment: occasionally  . Drug use: No  . Sexual activity: Not on file  Other Topics Concern  . Not on file  Social History Narrative   Regular exercise - YES     PHYSICAL EXAM:  VS: BP 132/74 (BP Location: Left Arm, Patient Position: Sitting, Cuff Size: Normal)   Pulse 98   Temp 98.4 F (36.9 C) (Oral)   Ht 5\' 4"  (1.626 m)   Wt 227 lb (103 kg)   SpO2 98%   BMI 38.96 kg/m  Physical Exam Gen: NAD, alert, cooperative with exam, well-appearing ENT: normal lips, normal nasal mucosa, tympanic membranes clear and intact bilaterally, normal oropharynx, no tenderness to palpation of the  frontal or maxillary sinus Eye: normal EOM, normal conjunctiva and lids CV:  no edema, +2 pedal pulses, regular rate and rhythm, S1-S2   Resp: no accessory muscle use, non-labored, clear to auscultation bilaterally, no crackles or wheezes Skin: no rashes, no areas of induration  Neuro: normal tone, normal sensation to touch Psych:  normal insight, alert and oriented MSK: Normal gait, normal strength      ASSESSMENT & PLAN:   Cough Related to a virus. - Counseled on supportive care - Given indications to return

## 2017-11-25 NOTE — Assessment & Plan Note (Signed)
Related to a virus. - Counseled on supportive care - Given indications to return

## 2017-11-25 NOTE — Patient Instructions (Signed)
Thank you for coming in,   Please try things such as zyrtec-D or allegra-D which is an antihistamine and decongestant.   Please try afrin which will help with nasal congestion but use for only three days.   Please also try using a netti pot on a regular occasion.  Honey can help with a sore throat.      Please feel free to call with any questions or concerns at any time, at [phone]. --Dr. Alyissa Whidbee  

## 2017-11-30 DIAGNOSIS — Z01419 Encounter for gynecological examination (general) (routine) without abnormal findings: Secondary | ICD-10-CM | POA: Diagnosis not present

## 2017-11-30 DIAGNOSIS — N959 Unspecified menopausal and perimenopausal disorder: Secondary | ICD-10-CM | POA: Diagnosis not present

## 2017-11-30 DIAGNOSIS — J069 Acute upper respiratory infection, unspecified: Secondary | ICD-10-CM | POA: Diagnosis not present

## 2017-11-30 DIAGNOSIS — Z1389 Encounter for screening for other disorder: Secondary | ICD-10-CM | POA: Diagnosis not present

## 2017-11-30 DIAGNOSIS — Z1231 Encounter for screening mammogram for malignant neoplasm of breast: Secondary | ICD-10-CM | POA: Diagnosis not present

## 2017-12-21 ENCOUNTER — Ambulatory Visit: Payer: 59 | Admitting: Internal Medicine

## 2017-12-28 ENCOUNTER — Other Ambulatory Visit: Payer: Self-pay

## 2017-12-28 ENCOUNTER — Ambulatory Visit (HOSPITAL_COMMUNITY)
Admission: EM | Admit: 2017-12-28 | Discharge: 2017-12-28 | Disposition: A | Discharge status: Home / Self Care | Payer: 59 | Attending: Family Medicine | Admitting: Family Medicine

## 2017-12-28 ENCOUNTER — Encounter (HOSPITAL_COMMUNITY): Payer: Self-pay | Admitting: Emergency Medicine

## 2017-12-28 DIAGNOSIS — J029 Acute pharyngitis, unspecified: Secondary | ICD-10-CM

## 2017-12-28 DIAGNOSIS — Z794 Long term (current) use of insulin: Secondary | ICD-10-CM | POA: Diagnosis not present

## 2017-12-28 DIAGNOSIS — I10 Essential (primary) hypertension: Secondary | ICD-10-CM | POA: Insufficient documentation

## 2017-12-28 DIAGNOSIS — Z79899 Other long term (current) drug therapy: Secondary | ICD-10-CM | POA: Diagnosis not present

## 2017-12-28 DIAGNOSIS — E119 Type 2 diabetes mellitus without complications: Secondary | ICD-10-CM | POA: Insufficient documentation

## 2017-12-28 LAB — POCT RAPID STREP A: STREPTOCOCCUS, GROUP A SCREEN (DIRECT): NEGATIVE

## 2017-12-28 NOTE — ED Triage Notes (Addendum)
Family member with similar symptoms.  Patient started feeling bad last night.  Complains of sore throat only at this time  Bump in private area, unsure if ingrown hair or not and wants evaluation

## 2017-12-28 NOTE — Discharge Instructions (Addendum)
Your strep test is negative today which is consistent with your symptoms and presentation today. We will culture your throat for definitive testing, will notify you if this comes back positive and if medications need to be changed. Continue lozenges, sprays, honey tea, cold beverages for throat pain. Ibuprofen may be helpful as well. Do not share beverages utensils etc with those in the home.  If symptoms worsen or do not improve in the next week to return to be seen or to follow up with your PCP.   Will notify you of any positive findings from your urine specimen. Please follow up with gynecology if lesion does not improve or if worsens in the next 1-2 weeks.

## 2017-12-28 NOTE — ED Provider Notes (Signed)
[facility]    CSN: [id number] Arrival date & time: 12/28/17  1023     History   Chief Complaint Chief Complaint  Patient presents with  . Sore Throat    HPI [name] [name] is a 47 y.o. female.   [name] [name] presents with complaints of sore throat which started last night and this morning. She states her grandson has had a fever, daughter with sore throat and fever with diagnosis today of strep throat. [name] [name] without fever. Without cough, runny nose, ear pain. Pain with swallowing. Denies gi/gu complaints. Without skin rash. She is eating and drinking. Tried chloroseptic spray to throat which minimally helped. History hypertension, gerd, dm2. Medications reviewed. Patient also complains of lesion to vulva, as well as vaginal discharge with odor. States she does have a new partner and does not always use condoms.     ROS per HPI.       Past Medical History:  Diagnosis Date  . Anemia, iron deficiency   . Chronic headache    Dr Vela Prose, Neg MRI 2005  . Diabetes mellitus   . Elevated glucose 2011  . GERD (gastroesophageal reflux disease)   . HTN (hypertension)     Patient Active Problem List   Diagnosis Date Noted  . Bursitis of right shoulder 09/01/2017  . Dyslipidemia 08/17/2017  . Shoulder pain, right 08/17/2017  . Mild intermittent asthma with acute exacerbation 08/04/2017  . RTI (respiratory tract infection) 08/04/2017  . Dysuria 03/16/2017  . Tick bite of back 03/16/2017  . Multinodular goiter 11/03/2016  . Wellness examination 11/03/2016  . Goiter 03/07/2014  . Low back pain 10/13/2012  . Herpes zoster 10/13/2012  . Cough 04/20/2011  . Vitamin D deficiency 03/08/2011  . Diabetes type 2, controlled (HCC) 03/06/2011  . INSOMNIA, CHRONIC 07/28/2010  . OBESITY 06/24/2009  . GERD 03/11/2009  . ANEMIA-IRON DEFICIENCY 10/19/2007  . Essential hypertension 10/19/2007    Past Surgical History:  Procedure Laterality Date  . ABDOMINAL HYSTERECTOMY     . BREAST REDUCTION SURGERY    . Cyst removed from vagina    . INDUCED ABORTION  1990-1994   x 4  . NOSE SURGERY    . PARTIAL HYSTERECTOMY  2010  . TUBAL LIGATION      OB History    No data available       Home Medications    Prior to Admission medications   Medication Sig Start Date End Date Taking? Authorizing Provider  budesonide-formoterol (SYMBICORT) 160-4.5 MCG/ACT inhaler Inhale 2 puffs into the lungs 2 (two) times daily. 08/10/17 08/10/18 Yes Plotnikov, Georgina Quint, MD  cetirizine (ZYRTEC) 10 MG tablet Take 1 tablet (10 mg total) by mouth daily. 10/22/16  Yes Tyrone Nine, MD  metFORMIN (GLUCOPHAGE) 500 MG tablet Take 2 tablets (1,000 mg total) by mouth 2 (two) times daily with a meal. 07/28/16  Yes Plotnikov, Georgina Quint, MD  Olmesartan-Amlodipine-HCTZ 40-10-25 MG TABS take 1 tablet by mouth once daily 08/02/17  Yes Plotnikov, Georgina Quint, MD  benzonatate (TESSALON) 100 MG capsule Take 1 capsule (100 mg total) by mouth 2 (two) times daily as needed for cough. 11/25/17   Myra Rude, MD  chlorhexidine (PERIDEX) 0.12 % solution Use as directed 15 mLs in the mouth or throat 2 (two) times daily. 08/03/16   Elvina Sidle, MD  Cholecalciferol 1000 UNITS tablet Take 1,000 Units by mouth daily.      [provider]  diclofenac (VOLTAREN) 75 MG EC tablet Take 1 tablet (  75 mg total) by mouth 2 (two) times daily. 08/01/17   Dorena Bodo, NP  Diclofenac Sodium (PENNSAID) 2 % SOLN Place 2 application onto the skin 2 (two) times daily. 09/01/17   Judi Saa, DO  Dulaglutide (TRULICITY) 1.5 MG/0.5ML SOPN Inject 1.5 mg into the skin once a week. 07/29/17   Romero Belling, MD  fluticasone (FLONASE) 50 MCG/ACT nasal spray Place 2 sprays into both nostrils daily. 10/22/16   Tyrone Nine, MD  Insulin Pen Needle (BD PEN NEEDLE NANO U/F) 32G X 4 MM MISC Use to inject insulin 1 time per day. 06/09/17   Romero Belling, MD  Lancets (FREESTYLE) lancets Use two times daily as instructed.  Dx: 250.00 03/30/13   Plotnikov, Georgina Quint, MD  magic mouthwash w/lidocaine SOLN Take 5 mLs by mouth 3 (three) times daily as needed for mouth pain. 08/01/17   Dorena Bodo, NP  meloxicam (MOBIC) 15 MG tablet Take 1 tablet (15 mg total) by mouth daily as needed for pain. 03/16/17   Corwin Levins, MD  Multiple Vitamins-Calcium (ONE-A-DAY WOMENS PO) Take 1 tablet by mouth daily.    [provider]  [facility] VERIO test strip TEST as directed 07/04/17   Romero Belling, MD  Tetrahydrozoline-Zn Sulfate (EYE DROPS ALLERGY RELIEF OP) Place 1 drop into both eyes daily as needed (allergies).    [provider]    Family History Family History  Problem Relation Age of Onset  . Hypertension Mother   . Cancer Mother 20       Esophageal  . Hypertension Maternal Grandmother     Social History Social History   Tobacco Use  . Smoking status: Never Smoker  . Smokeless tobacco: Never Used  Substance Use Topics  . Alcohol use: Yes    Alcohol/week: 0.0 oz    Comment: occasionally  . Drug use: No     Allergies   Ciprofloxacin and Tramadol   Review of Systems Review of Systems   Physical Exam Triage Vital Signs ED Triage Vitals  Enc Vitals Group     BP 12/28/17 1139 (!) 163/104     Pulse Rate 12/28/17 1139 89     Resp 12/28/17 1139 20     Temp 12/28/17 1139 98.2 F (36.8 C)     Temp Source 12/28/17 1139 Oral     SpO2 12/28/17 1139 97 %     Weight --      Height --      Head Circumference --      Peak Flow --      Pain Score 12/28/17 1134 4     Pain Loc --      Pain Edu? --      Excl. in GC? --    No data found.  Updated Vital Signs BP (!) 163/104 (BP Location: Left Arm) Comment: 2nd attempt Comment (BP Location): has not had medicine today ?compliance  Pulse 89   Temp 98.2 F (36.8 C) (Oral)   Resp 20   SpO2 97%   Visual Acuity Right Eye Distance:   Left Eye Distance:   Bilateral Distance:    Right Eye Near:   Left Eye Near:    Bilateral Near:       Physical Exam  Constitutional: She is oriented to person, place, and time. She appears well-developed and well-nourished. No distress.  HENT:  Head: Normocephalic and atraumatic.  Right Ear: Tympanic membrane, external ear and ear canal normal.  Left Ear: Tympanic membrane, external ear  and ear canal normal.  Nose: Nose normal.  Mouth/Throat: Uvula is midline, oropharynx is clear and moist and mucous membranes are normal. Tonsils are 1+ on the right. Tonsils are 1+ on the left. No tonsillar exudate.  Eyes: Conjunctivae and EOM are normal. Pupils are equal, round, and reactive to light.  Cardiovascular: Normal rate, regular rhythm and normal heart sounds.  Pulmonary/Chest: Effort normal and breath sounds normal.  Genitourinary:     Genitourinary Comments: Approximately 3mm raised skin tone lesion, soft, without head; not red, mild tend; without drainage; skin intact   Neurological: She is alert and oriented to person, place, and time.  Skin: Skin is warm and dry.     UC Treatments / Results  Labs (all labs ordered are listed, but only abnormal results are displayed) Labs Reviewed  CULTURE, GROUP A STREP [facility]  POCT RAPID STREP A  URINE CYTOLOGY ANCILLARY ONLY    EKG  EKG Interpretation None       Radiology No results found.  Procedures Procedures (including critical care time)  Medications Ordered in UC Medications - No data to display   Initial Impression / Assessment and Plan / UC Course  I have reviewed the triage vital signs and the nursing notes.  Pertinent labs & imaging results that were available during my care of the patient were reviewed by me and considered in my medical decision making (see chart for details).    Negative strep today in clinic, without fever, exam without indications of strep at this time. Exposure in home however. Culture sent. Discussed continuing to monitor symptoms as exposure does not always lead to infection. Supportive cares  recommended.  Patient verbalized understanding and agreeable to plan.    Patient with small vulvar lesion present, new partner and does not use condoms. Discussed herpes vs genital warts although lesion is not consistent with these at this time. Inconsistent with folliculitis as well. Recommend recheck with gyn if it persists or does not improve in the next week. Urine cytology pending. Will notify of any positive findings and if any changes to treatment are needed.     Final Clinical Impressions(s) / UC Diagnoses   Final diagnoses:  Acute pharyngitis, unspecified etiology    ED Discharge Orders    None       Controlled Substance Prescriptions [place] Controlled Substance Registry consulted? Not Applicable   Georgetta HaberBurky, Natalie B, NP 12/28/17 1243

## 2017-12-29 LAB — URINE CYTOLOGY ANCILLARY ONLY
Chlamydia: NEGATIVE
NEISSERIA GONORRHEA: NEGATIVE
Trichomonas: NEGATIVE

## 2017-12-30 DIAGNOSIS — N760 Acute vaginitis: Secondary | ICD-10-CM | POA: Diagnosis not present

## 2017-12-30 LAB — CULTURE, GROUP A STREP (THRC)

## 2018-01-03 LAB — URINE CYTOLOGY ANCILLARY ONLY: CANDIDA VAGINITIS: NEGATIVE

## 2018-01-18 ENCOUNTER — Telehealth: Payer: Self-pay | Admitting: Internal Medicine

## 2018-01-18 NOTE — Telephone Encounter (Signed)
Pt called regarding a no show fee she received for her visit on 1/8, the patient called in 1/3 to cancel this appointment can this be corrected for the patient.

## 2018-01-19 NOTE — Telephone Encounter (Signed)
We have waived fee as a one time courtesy to patient.  Tried to call patient.  Patient does not have a VM set up.

## 2018-02-08 ENCOUNTER — Ambulatory Visit: Payer: 59 | Admitting: Endocrinology

## 2018-02-08 ENCOUNTER — Encounter: Payer: Self-pay | Admitting: Endocrinology

## 2018-02-08 VITALS — BP 116/82 | HR 91 | Ht 64.0 in | Wt 227.0 lb

## 2018-02-08 DIAGNOSIS — E119 Type 2 diabetes mellitus without complications: Secondary | ICD-10-CM

## 2018-02-08 LAB — POCT GLYCOSYLATED HEMOGLOBIN (HGB A1C): HEMOGLOBIN A1C: 8.4

## 2018-02-08 MED ORDER — OLMESARTAN-AMLODIPINE-HCTZ 40-10-12.5 MG PO TABS: 1.0000 | ORAL_TABLET | Freq: Every day | ORAL | 11 refills | Status: DC

## 2018-02-08 MED ORDER — EMPAGLIFLOZIN 10 MG PO TABS: 10.0000 mg | ORAL_TABLET | Freq: Every day | ORAL | 11 refills | Status: DC

## 2018-02-08 NOTE — Progress Notes (Signed)
Subjective:    Patient ID: [name] [name], female    DOB: 12/02/1971, 47 y.o.   MRN: [id number]  HPI Pt returns for f/u of diabetes mellitus:  DM type: 2 Dx'ed: 2011 Complications: nephropathy.   Therapy: metformin and trulicity.  GDM: never DKA: never Severe hypoglycemia: never Pancreatitis: never Other: She declines weight-loss surgery; she has never taken insulin, but she has learned how.   Interval history: no cbg record, but states cbg's are in the mid-100's.  pt states she feels well in general.  She takes meds as rx'ed Pt also has small multinodular goiter (dx'ed 2015; US showed the nodules too small to merit bx; f/u US in 2016 showed no signif change; she has been euthyroid on no rx).  Past Medical History:  Diagnosis Date  . Anemia, iron deficiency   . Chronic headache    Dr Vela ProseLewitt, Neg MRI 2005  . Diabetes mellitus   . Elevated glucose 2011  . GERD (gastroesophageal reflux disease)   . HTN (hypertension)     Past Surgical History:  Procedure Laterality Date  . ABDOMINAL HYSTERECTOMY    . BREAST REDUCTION SURGERY    . Cyst removed from vagina    . INDUCED ABORTION  1990-1994   x 4  . NOSE SURGERY    . PARTIAL HYSTERECTOMY  2010  . TUBAL LIGATION      Social History   Socioeconomic History  . Marital status: Single    Spouse name: Not on file  . Number of children: 2  . Years of education: Not on file  . Highest education level: Not on file  Social Needs  . Financial resource strain: Not on file  . Food insecurity - worry: Not on file  . Food insecurity - inability: Not on file  . Transportation needs - medical: Not on file  . Transportation needs - non-medical: Not on file  Occupational History  . Not on file  Tobacco Use  . Smoking status: Never Smoker  . Smokeless tobacco: Never Used  Substance and Sexual Activity  . Alcohol use: Yes    Alcohol/week: 0.0 oz    Comment: occasionally  . Drug use: No  . Sexual activity: Not on file  Other  Topics Concern  . Not on file  Social History Narrative   Regular exercise - YES    Current Outpatient Medications on File Prior to Visit  Medication Sig Dispense Refill  . benzonatate (TESSALON) 100 MG capsule Take 1 capsule (100 mg total) by mouth 2 (two) times daily as needed for cough. 20 capsule 0  . budesonide-formoterol (SYMBICORT) 160-4.5 MCG/ACT inhaler Inhale 2 puffs into the lungs 2 (two) times daily. 1 Inhaler 11  . cetirizine (ZYRTEC) 10 MG tablet Take 1 tablet (10 mg total) by mouth daily. 30 tablet 0  . chlorhexidine (PERIDEX) 0.12 % solution Use as directed 15 mLs in the mouth or throat 2 (two) times daily. 120 mL 0  . Cholecalciferol 1000 UNITS tablet Take 1,000 Units by mouth daily.      . diclofenac (VOLTAREN) 75 MG EC tablet Take 1 tablet (75 mg total) by mouth 2 (two) times daily. 20 tablet 0  . Diclofenac Sodium (PENNSAID) 2 % SOLN Place 2 application onto the skin 2 (two) times daily. 112 g 3  . Dulaglutide (TRULICITY) 1.5 MG/0.5ML SOPN Inject 1.5 mg into the skin once a week. 4 pen 11  . fluticasone (FLONASE) 50 MCG/ACT nasal spray Place 2 sprays into both  nostrils daily. 16 g 0  . Lancets (FREESTYLE) lancets Use two times daily as instructed. Dx: 250.00 100 each 6  . magic mouthwash w/lidocaine SOLN Take 5 mLs by mouth 3 (three) times daily as needed for mouth pain. 100 mL 0  . meloxicam (MOBIC) 15 MG tablet Take 1 tablet (15 mg total) by mouth daily as needed for pain. 30 tablet 2  . metFORMIN (GLUCOPHAGE) 500 MG tablet Take 2 tablets (1,000 mg total) by mouth 2 (two) times daily with a meal. 120 tablet 11  . Multiple Vitamins-Calcium (ONE-A-DAY WOMENS PO) Take 1 tablet by mouth daily.    Letta Pate VERIO test strip TEST as directed 100 each 3  . Tetrahydrozoline-Zn Sulfate (EYE DROPS ALLERGY RELIEF OP) Place 1 drop into both eyes daily as needed (allergies).    . [DISCONTINUED] zolpidem (AMBIEN) 10 MG tablet Take 0.5-1 tablets (5-10 mg total) by mouth at bedtime as  needed for sleep. 30 tablet 3   No current facility-administered medications on file prior to visit.     Allergies  Allergen Reactions  . Ciprofloxacin     REACTION: gittery  . Tramadol Other (See Comments)    Headaches worse    Family History  Problem Relation Age of Onset  . Hypertension Mother   . Cancer Mother 35       Esophageal  . Hypertension Maternal Grandmother     BP 116/82   Pulse 91   Ht 5\' 4"  (1.626 m)   Wt 227 lb (103 kg)   SpO2 98%   BMI 38.96 kg/m    Review of Systems She denies hypoglycemia    Objective:   Physical Exam VITAL SIGNS:  See vs page GENERAL: no distress Pulses: dorsalis pedis intact bilat.   MSK: no deformity of the feet CV: no leg edema Skin:  no ulcer on the feet.  normal color and temp on the feet. Neuro: sensation is intact to touch on the feet   Lab Results  Component Value Date   CREATININE 0.81 08/17/2017   BUN 15 08/17/2017   NA 137 08/17/2017   K 4.0 08/17/2017   CL 99 08/17/2017   CO2 31 08/17/2017   Lab Results  Component Value Date   HGBA1C 8.4 02/08/2018      Assessment & Plan:  Type 2 DM, with nephropathy: worse.  HTN: if we add jardiance, we'll need to adjust rx for this.   Patient Instructions  I have sent 2 prescriptions to your pharmacy: to add "jardiance," and to decrease the olmesartan/amlodipine/HCTZ pill. check your blood sugar once a day.  vary the time of day when you check, between before the 3 meals, and at bedtime.  also check if you have symptoms of your blood sugar being too high or too low.  please keep a record of the readings and bring it to your next appointment here (or you can bring the meter itself).  You can write it on any piece of paper.  please call us sooner if your blood sugar goes below 70, or if you have a lot of readings over 200. Please come back for a follow-up appointment in 2 months.

## 2018-02-08 NOTE — Patient Instructions (Addendum)
I have sent 2 prescriptions to your pharmacy: to add "jardiance," and to decrease the olmesartan/amlodipine/HCTZ pill. check your blood sugar once a day.  vary the time of day when you check, between before the 3 meals, and at bedtime.  also check if you have symptoms of your blood sugar being too high or too low.  please keep a record of the readings and bring it to your next appointment here (or you can bring the meter itself).  You can write it on any piece of paper.  please call us sooner if your blood sugar goes below 70, or if you have a lot of readings over 200. Please come back for a follow-up appointment in 2 months.

## 2018-04-12 ENCOUNTER — Ambulatory Visit: Payer: 59 | Admitting: Endocrinology

## 2018-04-12 ENCOUNTER — Encounter: Payer: Self-pay | Admitting: Endocrinology

## 2018-04-12 VITALS — BP 124/86 | HR 89 | Wt 230.2 lb

## 2018-04-12 DIAGNOSIS — Z794 Long term (current) use of insulin: Secondary | ICD-10-CM

## 2018-04-12 DIAGNOSIS — E1169 Type 2 diabetes mellitus with other specified complication: Secondary | ICD-10-CM

## 2018-04-12 LAB — POCT GLYCOSYLATED HEMOGLOBIN (HGB A1C): HEMOGLOBIN A1C: 8.4

## 2018-04-12 MED ORDER — REPAGLINIDE 1 MG PO TABS: 1.0000 mg | ORAL_TABLET | Freq: Three times a day (TID) | ORAL | 11 refills | Status: DC

## 2018-04-12 NOTE — Patient Instructions (Addendum)
I have sent a prescription to your pharmacy, to add "repaglinide."  check your blood sugar once a day.  vary the time of day when you check, between before the 3 meals, and at bedtime.  also check if you have symptoms of your blood sugar being too high or too low.  please keep a record of the readings and bring it to your next appointment here (or you can bring the meter itself).  You can write it on any piece of paper.  please call us sooner if your blood sugar goes below 70, or if you have a lot of readings over 200.  Please come back for a follow-up appointment in 2 months.

## 2018-04-12 NOTE — Progress Notes (Signed)
Subjective:    Patient ID: [name] [name], female    DOB: December 10, 1971, 47 y.o.   MRN: [id number]  HPI Pt returns for f/u of diabetes mellitus:  DM type: 2 Dx'ed: 2011 Complications: nephropathy.   Therapy: metformin, jardiance, and and trulicity.  GDM: never DKA: never Severe hypoglycemia: never Pancreatitis: never Other: She declines weight-loss surgery; she has never taken insulin, but she has learned how.   Interval history: no cbg record, but states cbg's are in the mid-100's.  pt states she feels well in general.  She takes meds as rx'ed.   Pt also has small multinodular goiter (dx'ed 2015; US showed the nodules too small to merit bx; f/u US in 2016 showed no signif change; she has been euthyroid on no rx).  Past Medical History:  Diagnosis Date  . Anemia, iron deficiency   . Chronic headache    Dr Vela Prose, Neg MRI 2005  . Diabetes mellitus   . Elevated glucose 2011  . GERD (gastroesophageal reflux disease)   . HTN (hypertension)     Past Surgical History:  Procedure Laterality Date  . ABDOMINAL HYSTERECTOMY    . BREAST REDUCTION SURGERY    . Cyst removed from vagina    . INDUCED ABORTION  1990-1994   x 4  . NOSE SURGERY    . PARTIAL HYSTERECTOMY  2010  . TUBAL LIGATION      Social History   Socioeconomic History  . Marital status: Single    Spouse name: Not on file  . Number of children: 2  . Years of education: Not on file  . Highest education level: Not on file  Occupational History  . Not on file  Social Needs  . Financial resource strain: Not on file  . Food insecurity:    Worry: Not on file    Inability: Not on file  . Transportation needs:    Medical: Not on file    Non-medical: Not on file  Tobacco Use  . Smoking status: Never Smoker  . Smokeless tobacco: Never Used  Substance and Sexual Activity  . Alcohol use: Yes    Alcohol/week: 0.0 oz    Comment: occasionally  . Drug use: No  . Sexual activity: Not on file  Lifestyle  . Physical  activity:    Days per week: Not on file    Minutes per session: Not on file  . Stress: Not on file  Relationships  . Social connections:    Talks on phone: Not on file    Gets together: Not on file    Attends religious service: Not on file    Active member of club or organization: Not on file    Attends meetings of clubs or organizations: Not on file    Relationship status: Not on file  . Intimate partner violence:    Fear of current or ex partner: Not on file    Emotionally abused: Not on file    Physically abused: Not on file    Forced sexual activity: Not on file  Other Topics Concern  . Not on file  Social History Narrative   Regular exercise - YES    Current Outpatient Medications on File Prior to Visit  Medication Sig Dispense Refill  . benzonatate (TESSALON) 100 MG capsule Take 1 capsule (100 mg total) by mouth 2 (two) times daily as needed for cough. 20 capsule 0  . budesonide-formoterol (SYMBICORT) 160-4.5 MCG/ACT inhaler Inhale 2 puffs into the lungs 2 (two)  times daily. 1 Inhaler 11  . cetirizine (ZYRTEC) 10 MG tablet Take 1 tablet (10 mg total) by mouth daily. 30 tablet 0  . chlorhexidine (PERIDEX) 0.12 % solution Use as directed 15 mLs in the mouth or throat 2 (two) times daily. 120 mL 0  . Cholecalciferol 1000 UNITS tablet Take 1,000 Units by mouth daily.      . diclofenac (VOLTAREN) 75 MG EC tablet Take 1 tablet (75 mg total) by mouth 2 (two) times daily. 20 tablet 0  . Diclofenac Sodium (PENNSAID) 2 % SOLN Place 2 application onto the skin 2 (two) times daily. 112 g 3  . Dulaglutide (TRULICITY) 1.5 MG/0.5ML SOPN Inject 1.5 mg into the skin once a week. 4 pen 11  . empagliflozin (JARDIANCE) 10 MG TABS tablet Take 10 mg by mouth daily. 30 tablet 11  . fluticasone (FLONASE) 50 MCG/ACT nasal spray Place 2 sprays into both nostrils daily. 16 g 0  . Lancets (FREESTYLE) lancets Use two times daily as instructed. Dx: 250.00 100 each 6  . magic mouthwash w/lidocaine SOLN  Take 5 mLs by mouth 3 (three) times daily as needed for mouth pain. 100 mL 0  . meloxicam (MOBIC) 15 MG tablet Take 1 tablet (15 mg total) by mouth daily as needed for pain. 30 tablet 2  . metFORMIN (GLUCOPHAGE) 500 MG tablet Take 2 tablets (1,000 mg total) by mouth 2 (two) times daily with a meal. 120 tablet 11  . Multiple Vitamins-Calcium (ONE-A-DAY WOMENS PO) Take 1 tablet by mouth daily.    . Olmesartan-amLODIPine-HCTZ 40-10-12.5 MG TABS Take 1 tablet by mouth daily. 30 tablet 11  . ONETOUCH VERIO test strip TEST as directed 100 each 3  . Tetrahydrozoline-Zn Sulfate (EYE DROPS ALLERGY RELIEF OP) Place 1 drop into both eyes daily as needed (allergies).    . [DISCONTINUED] zolpidem (AMBIEN) 10 MG tablet Take 0.5-1 tablets (5-10 mg total) by mouth at bedtime as needed for sleep. 30 tablet 3   No current facility-administered medications on file prior to visit.     Allergies  Allergen Reactions  . Ciprofloxacin     REACTION: gittery  . Tramadol Other (See Comments)    Headaches worse    Family History  Problem Relation Age of Onset  . Hypertension Mother   . Cancer Mother 60       Esophageal  . Hypertension Maternal Grandmother     BP 124/86   Pulse 89   Wt 230 lb 3.2 oz (104.4 kg)   SpO2 98%   BMI 39.51 kg/m    Review of Systems She denies hypoglycemia    Objective:   Physical Exam VITAL SIGNS:  See vs page GENERAL: no distress Pulses: foot pulses are intact bilaterally.   MSK: no deformity of the feet or ankles.  CV: no edema of the legs or ankles Skin:  no ulcer on the feet or ankles.  normal color and temp on the feet and ankles Neuro: sensation is intact to touch on the feet and ankles.     Lab Results  Component Value Date   HGBA1C 8.4 04/12/2018       Assessment & Plan:  Type 2 DM, with nephropathy: she needs increased rx.    Patient Instructions  I have sent a prescription to your pharmacy, to add "repaglinide."  check your blood sugar once a day.   vary the time of day when you check, between before the 3 meals, and at bedtime.  also check if  you have symptoms of your blood sugar being too high or too low.  please keep a record of the readings and bring it to your next appointment here (or you can bring the meter itself).  You can write it on any piece of paper.  please call us sooner if your blood sugar goes below 70, or if you have a lot of readings over 200.  Please come back for a follow-up appointment in 2 months.

## 2018-05-11 ENCOUNTER — Other Ambulatory Visit (INDEPENDENT_AMBULATORY_CARE_PROVIDER_SITE_OTHER): Payer: 59

## 2018-05-11 ENCOUNTER — Encounter: Payer: Self-pay | Admitting: Family

## 2018-05-11 ENCOUNTER — Ambulatory Visit: Payer: 59 | Admitting: Family

## 2018-05-11 VITALS — BP 128/82 | HR 86 | Temp 97.5°F | Ht 64.0 in | Wt 224.0 lb

## 2018-05-11 DIAGNOSIS — K625 Hemorrhage of anus and rectum: Secondary | ICD-10-CM

## 2018-05-11 LAB — CBC WITH DIFFERENTIAL/PLATELET
BASOS PCT: 1 % (ref 0.0–3.0)
Basophils Absolute: 0.1 10*3/uL (ref 0.0–0.1)
EOS ABS: 0.1 10*3/uL (ref 0.0–0.7)
EOS PCT: 1.5 % (ref 0.0–5.0)
HEMATOCRIT: 43.8 % (ref 36.0–46.0)
HEMOGLOBIN: 14.5 g/dL (ref 12.0–15.0)
LYMPHS PCT: 28 % (ref 12.0–46.0)
Lymphs Abs: 2.9 10*3/uL (ref 0.7–4.0)
MCHC: 33.2 g/dL (ref 30.0–36.0)
MCV: 87.3 fl (ref 78.0–100.0)
Monocytes Absolute: 0.9 10*3/uL (ref 0.1–1.0)
Monocytes Relative: 8.4 % (ref 3.0–12.0)
NEUTROS ABS: 6.2 10*3/uL (ref 1.4–7.7)
Neutrophils Relative %: 61.1 % (ref 43.0–77.0)
PLATELETS: 335 10*3/uL (ref 150.0–400.0)
RBC: 5.02 Mil/uL (ref 3.87–5.11)
RDW: 14.1 % (ref 11.5–15.5)
WBC: 10.2 10*3/uL (ref 4.0–10.5)

## 2018-05-11 NOTE — Progress Notes (Signed)
[name] [name] is a 47 y.o. female with the following history as recorded in [employer]:  Patient Active Problem List   Diagnosis Date Noted  . Bursitis of right shoulder 09/01/2017  . Dyslipidemia 08/17/2017  . Shoulder pain, right 08/17/2017  . Mild intermittent asthma with acute exacerbation 08/04/2017  . RTI (respiratory tract infection) 08/04/2017  . Dysuria 03/16/2017  . Tick bite of back 03/16/2017  . Multinodular goiter 11/03/2016  . Wellness examination 11/03/2016  . Goiter 03/07/2014  . Low back pain 10/13/2012  . Herpes zoster 10/13/2012  . Cough 04/20/2011  . Vitamin D deficiency 03/08/2011  . Diabetes type 2, controlled (HCC) 03/06/2011  . INSOMNIA, CHRONIC 07/28/2010  . OBESITY 06/24/2009  . GERD 03/11/2009  . ANEMIA-IRON DEFICIENCY 10/19/2007  . Essential hypertension 10/19/2007    Current Outpatient Medications  Medication Sig Dispense Refill  . benzonatate (TESSALON) 100 MG capsule Take 1 capsule (100 mg total) by mouth 2 (two) times daily as needed for cough. 20 capsule 0  . budesonide-formoterol (SYMBICORT) 160-4.5 MCG/ACT inhaler Inhale 2 puffs into the lungs 2 (two) times daily. 1 Inhaler 11  . cetirizine (ZYRTEC) 10 MG tablet Take 1 tablet (10 mg total) by mouth daily. 30 tablet 0  . chlorhexidine (PERIDEX) 0.12 % solution Use as directed 15 mLs in the mouth or throat 2 (two) times daily. 120 mL 0  . Cholecalciferol 1000 UNITS tablet Take 1,000 Units by mouth daily.      . diclofenac (VOLTAREN) 75 MG EC tablet Take 1 tablet (75 mg total) by mouth 2 (two) times daily. 20 tablet 0  . Diclofenac Sodium (PENNSAID) 2 % SOLN Place 2 application onto the skin 2 (two) times daily. 112 g 3  . Dulaglutide (TRULICITY) 1.5 MG/0.5ML SOPN Inject 1.5 mg into the skin once a week. 4 pen 11  . empagliflozin (JARDIANCE) 10 MG TABS tablet Take 10 mg by mouth daily. 30 tablet 11  . fluticasone (FLONASE) 50 MCG/ACT nasal spray Place 2 sprays into both nostrils daily. 16 g 0  .  Lancets (FREESTYLE) lancets Use two times daily as instructed. Dx: 250.00 100 each 6  . magic mouthwash w/lidocaine SOLN Take 5 mLs by mouth 3 (three) times daily as needed for mouth pain. 100 mL 0  . meloxicam (MOBIC) 15 MG tablet Take 1 tablet (15 mg total) by mouth daily as needed for pain. 30 tablet 2  . metFORMIN (GLUCOPHAGE) 500 MG tablet Take 2 tablets (1,000 mg total) by mouth 2 (two) times daily with a meal. 120 tablet 11  . Multiple Vitamins-Calcium (ONE-A-DAY WOMENS PO) Take 1 tablet by mouth daily.    . Olmesartan-amLODIPine-HCTZ 40-10-12.5 MG TABS Take 1 tablet by mouth daily. 30 tablet 11  . ONETOUCH VERIO test strip TEST as directed 100 each 3  . repaglinide (PRANDIN) 1 MG tablet Take 1 tablet (1 mg total) by mouth 3 (three) times daily before meals. 90 tablet 11  . Tetrahydrozoline-Zn Sulfate (EYE DROPS ALLERGY RELIEF OP) Place 1 drop into both eyes daily as needed (allergies).     No current facility-administered medications for this visit.     Allergies: Ciprofloxacin and Tramadol  Past Medical History:  Diagnosis Date  . Anemia, iron deficiency   . Chronic headache    Dr Vela Prose, Neg MRI 2005  . Diabetes mellitus   . Elevated glucose 2011  . GERD (gastroesophageal reflux disease)   . HTN (hypertension)     Past Surgical History:  Procedure Laterality Date  .  ABDOMINAL HYSTERECTOMY    . BREAST REDUCTION SURGERY    . Cyst removed from vagina    . INDUCED ABORTION  1990-1994   x 4  . NOSE SURGERY    . PARTIAL HYSTERECTOMY  2010  . TUBAL LIGATION      Family History  Problem Relation Age of Onset  . Hypertension Mother   . Cancer Mother 57       Esophageal  . Hypertension Maternal Grandmother     Social History   Tobacco Use  . Smoking status: Never Smoker  . Smokeless tobacco: Never Used  Substance Use Topics  . Alcohol use: Yes    Alcohol/week: 0.0 oz    Comment: occasionally    Subjective:  Patient notes she was constipated last week- was straining  to have bowel movement and noticed 1 episode of bright red blood. Notes that blood was noted in the toilet bowl; Has not seen blood since that one episode; Notes that bowel movements have been fine for the past week with no further blood seen;  Does have history of hemorrhoids- notes that they are typically external; Has used preparation H but not with this particular episode;  Denies any abdominal pain, nausea or vomiting. No FH of colon cancer; mother had esophageal cancer and patient is up to date on colonoscopy.   Objective:  Vitals:   05/11/18 0825  BP: 128/82  Pulse: 86  Temp: (!) 97.5 F (36.4 C)  TempSrc: Oral  SpO2: 99%  Weight: 224 lb (101.6 kg)  Height:  (1.626 m)    General: Well developed, well nourished, in no acute distress  Skin : Warm and dry.  Head: Normocephalic and atraumatic  Lungs: Respirations unlabored; Extremities: No edema, cyanosis, clubbing  Vessels: Symmetric bilaterally  Neurologic: Alert and oriented; speech intact; face symmetrical; moves all extremities well; CNII-XII intact without focal deficit  Rectal exam- no external abnormality noted; Assessment:  1. Rectal bleeding     Plan:  Suspect small tear was source of symptoms as patient was constipated/ straining and she felt a tearing sensation when episode occurred; no further episodes of blood seen; check CBC today; encouraged fiber, water; follow-up if symptoms return or she sees dark blood and will refer to GI;   No follow-ups on file.  Orders Placed This Encounter  Procedures  . CBC w/Diff    Standing Status:   Future    Number of Occurrences:   1    Standing Expiration Date:   05/11/2019    Requested Prescriptions    No prescriptions requested or ordered in this encounter

## 2018-05-23 DIAGNOSIS — Z124 Encounter for screening for malignant neoplasm of cervix: Secondary | ICD-10-CM | POA: Diagnosis not present

## 2018-06-03 ENCOUNTER — Encounter: Payer: Self-pay | Admitting: Internal Medicine

## 2018-06-06 ENCOUNTER — Other Ambulatory Visit: Payer: Self-pay | Admitting: Family

## 2018-06-06 ENCOUNTER — Encounter: Payer: Self-pay | Admitting: Physician Assistant

## 2018-06-06 DIAGNOSIS — K625 Hemorrhage of anus and rectum: Secondary | ICD-10-CM

## 2018-06-14 ENCOUNTER — Ambulatory Visit: Payer: 59 | Admitting: Endocrinology

## 2018-06-23 ENCOUNTER — Ambulatory Visit (INDEPENDENT_AMBULATORY_CARE_PROVIDER_SITE_OTHER): Payer: 59 | Admitting: Physician Assistant

## 2018-06-23 ENCOUNTER — Encounter: Payer: Self-pay | Admitting: Physician Assistant

## 2018-06-23 VITALS — BP 176/130 | HR 84 | Ht 63.25 in | Wt 226.0 lb

## 2018-06-23 DIAGNOSIS — K625 Hemorrhage of anus and rectum: Secondary | ICD-10-CM

## 2018-06-23 MED ORDER — PEG-KCL-NACL-NASULF-NA ASC-C 100 G PO SOLR: ORAL | 0 refills | Status: DC

## 2018-06-23 NOTE — Patient Instructions (Addendum)
You have been scheduled for a colonoscopy. Please follow written instructions given to you at your visit today.  Please pick up your prep supplies at the pharmacy within the next 1-3 days[facility] [address] [place].  If you use inhalers (even only as needed), please bring them with you on the day of your procedure. Your physician has requested that you go to www.startemmi.com and enter the access code given to you at your visit today. This web site gives a general overview about your procedure. However, you should still follow specific instructions given to you by our office regarding your preparation for the procedure.  Be sure to take your blood pressure medication the morning of the colonoscopy.    If you are age 47 or younger, your body mass index should be between 19-25. Your Body mass index is 39.72 kg/m. If this is out of the aformentioned range listed, please consider follow up with your Primary Care Provider.

## 2018-06-23 NOTE — Progress Notes (Signed)
Reviewed and agree with management plan.  Tallie Hevia T. Ky Rumple, MD FACG 

## 2018-06-23 NOTE — Progress Notes (Signed)
Subjective:    Patient ID: [name] [name], female    DOB: 06/10/1971, 47 y.o.   MRN: [id number]  HPI [name] [name] is a pleasant 47 year old African-American female, known remotely to Dr. Russella DarStark who is referred today by primary care/Dr. Plotnikov for evaluation of recent episodes of rectal bleeding. Patient has history of hypertension, GERD, adult onset diabetes mellitus and obesity.  She is status post hysterectomy and bilateral tubal ligation. She had been seen in GI remotely in 2012 and had EGD which was pertinent for mild gastritis.  She was H. pylori positive and treated. He has not had prior colonoscopy. Patient says she has recently had 4 random episodes of bright red blood per rectum over the past month.  She says the first 2 these episodes she may have been a little bit constipated, she noticed bright red blood on the tissue and in the commode and is not sure whether was mixed in with the stool.  The second 2 episodes occurred with normal bowel movements and again saw a bright red blood on the tissue in the commode and perhaps with the stool.  She has no complaints of rectal discomfort, no prior history of hemorrhoid issues.  No current complaints of abdominal pain, no recent changes in bowel habits. Patient showed me pictures from her phone showing a fair amount of bright red blood on toilet tissue and floating in the commode. Labs were done on 05/06/2018 WBC of 10.2 hemoglobin 14.5. Family history is negative for colon cancer.    Review of Systems Pertinent positive and negative review of systems were noted in the above HPI section.  All other review of systems was otherwise negative.  Outpatient Encounter Medications as of 06/23/2018  Medication Sig  . benzonatate (TESSALON) 100 MG capsule Take 1 capsule (100 mg total) by mouth 2 (two) times daily as needed for cough.  . budesonide-formoterol (SYMBICORT) 160-4.5 MCG/ACT inhaler Inhale 2 puffs into the lungs 2 (two) times daily.  .  cetirizine (ZYRTEC) 10 MG tablet Take 1 tablet (10 mg total) by mouth daily.  . chlorhexidine (PERIDEX) 0.12 % solution Use as directed 15 mLs in the mouth or throat 2 (two) times daily.  . Cholecalciferol 1000 UNITS tablet Take 1,000 Units by mouth daily.    . cyclobenzaprine (FLEXERIL) 5 MG tablet Take 1 tablet by mouth as needed.  . diclofenac (VOLTAREN) 75 MG EC tablet Take 1 tablet (75 mg total) by mouth 2 (two) times daily.  . Diclofenac Sodium (PENNSAID) 2 % SOLN Place 2 application onto the skin 2 (two) times daily.  . Dulaglutide (TRULICITY) 1.5 MG/0.5ML SOPN Inject 1.5 mg into the skin once a week.  . empagliflozin (JARDIANCE) 10 MG TABS tablet Take 10 mg by mouth daily.  . fluticasone (FLONASE) 50 MCG/ACT nasal spray Place 2 sprays into both nostrils daily.  . Lancets (FREESTYLE) lancets Use two times daily as instructed. Dx: 250.00  . magic mouthwash w/lidocaine SOLN Take 5 mLs by mouth 3 (three) times daily as needed for mouth pain.  . metFORMIN (GLUCOPHAGE) 500 MG tablet Take 2 tablets (1,000 mg total) by mouth 2 (two) times daily with a meal.  . Multiple Vitamins-Calcium (ONE-A-DAY WOMENS PO) Take 1 tablet by mouth daily.  . Olmesartan-amLODIPine-HCTZ 40-10-12.5 MG TABS Take 1 tablet by mouth daily.  Letta Pate. ONETOUCH VERIO test strip TEST as directed  . repaglinide (PRANDIN) 1 MG tablet Take 1 tablet (1 mg total) by mouth 3 (three) times daily before meals.  Jeananne Rama. Tetrahydrozoline-Zn  Sulfate (EYE DROPS ALLERGY RELIEF OP) Place 1 drop into both eyes daily as needed (allergies).  . peg 3350 powder (MOVIPREP) 100 g SOLR Take as directed for colonoscopy prep.  . [DISCONTINUED] meloxicam (MOBIC) 15 MG tablet Take 1 tablet (15 mg total) by mouth daily as needed for pain.  . [DISCONTINUED] zolpidem (AMBIEN) 10 MG tablet Take 0.5-1 tablets (5-10 mg total) by mouth at bedtime as needed for sleep.   No facility-administered encounter medications on file as of 06/23/2018.    Allergies  Allergen  Reactions  . Ciprofloxacin     REACTION: gittery  . Tramadol Other (See Comments)    Headaches worse   Patient Active Problem List   Diagnosis Date Noted  . Bursitis of right shoulder 09/01/2017  . Dyslipidemia 08/17/2017  . Shoulder pain, right 08/17/2017  . Mild intermittent asthma with acute exacerbation 08/04/2017  . RTI (respiratory tract infection) 08/04/2017  . Dysuria 03/16/2017  . Tick bite of back 03/16/2017  . Multinodular goiter 11/03/2016  . Wellness examination 11/03/2016  . Goiter 03/07/2014  . Low back pain 10/13/2012  . Herpes zoster 10/13/2012  . Cough 04/20/2011  . Vitamin D deficiency 03/08/2011  . Diabetes type 2, controlled (HCC) 03/06/2011  . INSOMNIA, CHRONIC 07/28/2010  . OBESITY 06/24/2009  . GERD 03/11/2009  . ANEMIA-IRON DEFICIENCY 10/19/2007  . Essential hypertension 10/19/2007   Social History   Socioeconomic History  . Marital status: Single    Spouse name: Not on file  . Number of children: 2  . Years of education: Not on file  . Highest education level: Not on file  Occupational History  . Occupation: office specialist  Social Needs  . Financial resource strain: Not on file  . Food insecurity:    Worry: Not on file    Inability: Not on file  . Transportation needs:    Medical: Not on file    Non-medical: Not on file  Tobacco Use  . Smoking status: Never Smoker  . Smokeless tobacco: Never Used  Substance and Sexual Activity  . Alcohol use: Yes    Alcohol/week: 0.0 oz    Comment: occasionally 2 per week  . Drug use: No  . Sexual activity: Not on file  Lifestyle  . Physical activity:    Days per week: Not on file    Minutes per session: Not on file  . Stress: Not on file  Relationships  . Social connections:    Talks on phone: Not on file    Gets together: Not on file    Attends religious service: Not on file    Active member of club or organization: Not on file    Attends meetings of clubs or organizations: Not on file      Relationship status: Not on file  . Intimate partner violence:    Fear of current or ex partner: Not on file    Emotionally abused: Not on file    Physically abused: Not on file    Forced sexual activity: Not on file  Other Topics Concern  . Not on file  Social History Narrative   Regular exercise - YES    Ms. Hourihan family history includes Esophageal cancer (age of onset: 51) in her mother; Hypertension in her maternal grandmother and mother; Ovarian cancer in her sister; Prostate cancer in her maternal grandfather; Stroke in her maternal grandmother.      Objective:    Vitals:   06/23/18 0908  BP: (!) 176/130  Pulse:  64    Physical Exam; well-developed African-American female in no acute distress, pleasant blood pressure 176/100 pulse 84, height 5 foot 3, weight 226, BMI 39.7.  HEENT; nontraumatic normocephalic EOMI PERRLA sclera anicteric, oropharynx benign, Cardiovascular ;regular rate and rhythm with S1-S2 no murmur rub or gallop, Pulmonary; clear bilaterally, Abdomen ;soft, nontender nondistended bowel sounds are active there is no palpable mass or hepatosplenomegaly, Rectal ;exam not done this was done by primary care prior to this visit and no external abnormality noted.  Extremities ;no clubbing cyanosis or edema skin warm and dry, Neuro psych; alert and oriented, grossly nonfocal mood and affect appropriate       Assessment & Plan:   #77 47 year old African-American female with 4 recent episodes of hematochezia.  No hemorrhoids noted on external exam, no complaints of rectal discomfort or abdominal discomfort no recent changes in bowel habits Etiology of bleeding is not clear, rule out secondary to internal hemorrhoids versus occult lesion  #2 adult onset diabetes mellitus 3.  Obesity-BMI 39 4.  Status post hysterectomy and bilateral tubal ligation  Plan; Patient will be scheduled for colonoscopy with Dr. Chales Abrahams.  Procedure was discussed in detail with the patient  including indications risks and benefits and she is agreeable to proceed. Patient is known to Dr. Russella Dar, however no procedure availability and therefore scheduled with Dr. Chales Abrahams. Further plans pending findings at colonoscopy.    Amy S Esterwood PA-C 06/23/2018   Cc: Olive Bass,*

## 2018-06-27 ENCOUNTER — Encounter: Payer: Self-pay | Admitting: Gastroenterology

## 2018-06-27 ENCOUNTER — Ambulatory Visit (AMBULATORY_SURGERY_CENTER): Payer: 59 | Admitting: Gastroenterology

## 2018-06-27 VITALS — BP 162/81 | HR 80 | Temp 97.3°F | Resp 17 | Ht 63.0 in | Wt 226.0 lb

## 2018-06-27 DIAGNOSIS — K625 Hemorrhage of anus and rectum: Secondary | ICD-10-CM | POA: Diagnosis not present

## 2018-06-27 MED ORDER — SODIUM CHLORIDE 0.9 % IV SOLN: 500.0000 mL | Freq: Once | INTRAVENOUS | Status: DC

## 2018-06-27 NOTE — Patient Instructions (Signed)
Information on hemorrhoids given to you today  Preparation H ointment : apply external rectal area twice daily for 7 days   If any further anal/rectal problems in the future ,get in touch with Dr Chales AbrahamsGupta  Follow up with Dr Russella DarStark in 8-12 weeks for further evaluation of anemia    YOU HAD AN ENDOSCOPIC PROCEDURE TODAY AT THE [facility]:   Refer to the procedure report that was given to you for any specific questions about what was found during the examination.  If the procedure report does not answer your questions, please call your gastroenterologist to clarify.  If you requested that your care partner not be given the details of your procedure findings, then the procedure report has been included in a sealed envelope for you to review at your convenience later.  YOU SHOULD EXPECT: Some feelings of bloating in the abdomen. Passage of more gas than usual.  Walking can help get rid of the air that was put into your GI tract during the procedure and reduce the bloating. If you had a lower endoscopy (such as a colonoscopy or flexible sigmoidoscopy) you may notice spotting of blood in your stool or on the toilet paper. If you underwent a bowel prep for your procedure, you may not have a normal bowel movement for a few days.  Please Note:  You might notice some irritation and congestion in your nose or some drainage.  This is from the oxygen used during your procedure.  There is no need for concern and it should clear up in a day or so.  SYMPTOMS TO REPORT IMMEDIATELY:   Following lower endoscopy (colonoscopy or flexible sigmoidoscopy):  Excessive amounts of blood in the stool  Significant tenderness or worsening of abdominal pains  Swelling of the abdomen that is new, acute  Fever of 100F or higher    For urgent or emergent issues, a gastroenterologist can be reached at any hour by calling [phone].   DIET:  We do recommend a small meal at first, but then you may proceed  to your regular diet.  Drink plenty of fluids but you should avoid alcoholic beverages for 24 hours.  ACTIVITY:  You should plan to take it easy for the rest of today and you should NOT DRIVE or use heavy machinery until tomorrow (because of the sedation medicines used during the test).    FOLLOW UP: Our staff will call the number listed on your records the next business day following your procedure to check on you and address any questions or concerns that you may have regarding the information given to you following your procedure. If we do not reach you, we will leave a message.  However, if you are feeling well and you are not experiencing any problems, there is no need to return our call.  We will assume that you have returned to your regular daily activities without incident.  If any biopsies were taken you will be contacted by phone or by letter within the next 1-3 weeks.  Please call us at [phone] if you have not heard about the biopsies in 3 weeks.    SIGNATURES/CONFIDENTIALITY: You and/or your care partner have signed paperwork which will be entered into your electronic medical record.  These signatures attest to the fact that that the information above on your After Visit Summary has been reviewed and is understood.  Full responsibility of the confidentiality of this discharge information lies with you and/or your care-partner.

## 2018-06-27 NOTE — Progress Notes (Signed)
Pt's states no medical or surgical changes since previsit or office visit. 

## 2018-06-27 NOTE — Op Note (Addendum)
[place] Patient Name: [name] [name] Procedure Date: 06/27/2018 10:57 AM MRN: [id number] Endoscopist: Jackquline Denmark , MD Age: 47 Referring MD:  Date of Birth: May 21, 1971 Gender: Female Account #: [id number] Procedure:                Colonoscopy Indications:              Rectal bleeding, Iron deficiency anemia Medicines:                Monitored Anesthesia Care Procedure:                Pre-Anesthesia Assessment:                           - Prior to the procedure, a History and Physical                            was performed, and patient medications and                            allergies were reviewed. The patient is competent.                            The risks and benefits of the procedure and the                            sedation options and risks were discussed with the                            patient. All questions were answered and informed                            consent was obtained. Patient identification and                            proposed procedure were verified by the physician                            in the procedure room. Mental Status Examination:                            alert and oriented. Prophylactic Antibiotics: The                            patient does not require prophylactic antibiotics.                            Prior Anticoagulants: The patient has taken no                            previous anticoagulant or antiplatelet agents. ASA                            Grade Assessment: II - A patient with mild systemic  disease. After reviewing the risks and benefits,                            the patient was deemed in satisfactory condition to                            undergo the procedure. The anesthesia plan was to                            use monitored anesthesia care (MAC). Immediately                            prior to administration of medications, the patient                            was  re-assessed for adequacy to receive sedatives.                            The heart rate, respiratory rate, oxygen                            saturations, blood pressure, adequacy of pulmonary                            ventilation, and response to care were monitored                            throughout the procedure. The physical status of                            the patient was re-assessed after the procedure.                           After obtaining informed consent, the colonoscope                            was passed under direct vision. Throughout the                            procedure, the patient's blood pressure, pulse, and                            oxygen saturations were monitored continuously. The                            Colonoscope was introduced through the anus and                            advanced to the 4 cm into the ileum. The                            colonoscopy was performed without difficulty. The  patient tolerated the procedure well. The quality                            of the bowel preparation was excellent. Scope In: 11:06:03 AM Scope Out: 11:25:20 AM Scope Withdrawal Time: 0 hours 10 minutes 22 seconds  Total Procedure Duration: 0 hours 19 minutes 17 seconds  Findings:                 The colon (entire examined portion) was redundant                            but normal.                           Non-bleeding internal hemorrhoids were found. The                            hemorrhoids were small.                           The exam was otherwise without abnormality on                            direct and retroflexion views. Complications:            No immediate complications. Estimated Blood Loss:     Estimated blood loss: none. Impression:               - Small non-bleeding internal hemorrhoids.                           - The examination was otherwise normal on direct                            and retroflexion  views. Recommendation:           - Patient has a contact number available for                            emergencies. The signs and symptoms of potential                            delayed complications were discussed with the                            patient. Return to normal activities tomorrow.                            Written discharge instructions were provided to the                            patient.                           - Resume previous diet.                           - Continue present medications.                           -  Preparation H ointment: Apply externally BID for                            7 days, if there is any further rectal bleeding. If                            she still has any anorectal problems in the future,                            she will get in touch with [place].                           - Follow up with Dr. Fuller Plan in 8-12 weeks for                            further evaluation of anemia. Jackquline Denmark, MD 06/27/2018 11:34:51 AM This report has been signed electronically.

## 2018-06-28 ENCOUNTER — Telehealth: Payer: Self-pay | Admitting: *Deleted

## 2018-06-28 NOTE — Telephone Encounter (Signed)
  Follow up Call-  Call back number 06/27/2018  Post procedure Call Back phone  # [phone]  Permission to leave phone message Yes  Some recent data might be hidden     Patient questions:  Do you have a fever, pain , or abdominal swelling? No. Pain Score  0 *  Have you tolerated food without any problems? Yes.    Have you been able to return to your normal activities? Yes.    Do you have any questions about your discharge instructions: Diet   No. Medications  No. Follow up visit  No.  Do you have questions or concerns about your Care? Yes.    Actions: * If pain score is 4 or above: No action needed, pain <4.

## 2018-06-30 ENCOUNTER — Encounter: Payer: 59 | Admitting: Gastroenterology

## 2018-07-31 ENCOUNTER — Encounter (HOSPITAL_COMMUNITY): Payer: Self-pay

## 2018-07-31 ENCOUNTER — Ambulatory Visit (HOSPITAL_COMMUNITY)
Admission: EM | Admit: 2018-07-31 | Discharge: 2018-07-31 | Disposition: A | Discharge status: Home / Self Care | Payer: 59 | Attending: Family Medicine | Admitting: Family Medicine

## 2018-07-31 DIAGNOSIS — L739 Follicular disorder, unspecified: Secondary | ICD-10-CM

## 2018-07-31 DIAGNOSIS — R358 Other polyuria: Secondary | ICD-10-CM | POA: Diagnosis present

## 2018-07-31 DIAGNOSIS — I10 Essential (primary) hypertension: Secondary | ICD-10-CM | POA: Insufficient documentation

## 2018-07-31 DIAGNOSIS — N3000 Acute cystitis without hematuria: Secondary | ICD-10-CM | POA: Diagnosis not present

## 2018-07-31 DIAGNOSIS — D509 Iron deficiency anemia, unspecified: Secondary | ICD-10-CM | POA: Diagnosis not present

## 2018-07-31 DIAGNOSIS — N898 Other specified noninflammatory disorders of vagina: Secondary | ICD-10-CM | POA: Diagnosis present

## 2018-07-31 DIAGNOSIS — Z7984 Long term (current) use of oral hypoglycemic drugs: Secondary | ICD-10-CM | POA: Diagnosis not present

## 2018-07-31 DIAGNOSIS — Z8249 Family history of ischemic heart disease and other diseases of the circulatory system: Secondary | ICD-10-CM | POA: Insufficient documentation

## 2018-07-31 DIAGNOSIS — E1165 Type 2 diabetes mellitus with hyperglycemia: Secondary | ICD-10-CM | POA: Insufficient documentation

## 2018-07-31 DIAGNOSIS — Z888 Allergy status to other drugs, medicaments and biological substances status: Secondary | ICD-10-CM | POA: Diagnosis not present

## 2018-07-31 DIAGNOSIS — K219 Gastro-esophageal reflux disease without esophagitis: Secondary | ICD-10-CM | POA: Insufficient documentation

## 2018-07-31 DIAGNOSIS — R739 Hyperglycemia, unspecified: Secondary | ICD-10-CM

## 2018-07-31 DIAGNOSIS — Z79899 Other long term (current) drug therapy: Secondary | ICD-10-CM | POA: Diagnosis not present

## 2018-07-31 DIAGNOSIS — R3589 Other polyuria: Secondary | ICD-10-CM

## 2018-07-31 LAB — GLUCOSE, CAPILLARY: Glucose-Capillary: 254 mg/dL — ABNORMAL HIGH (ref 70–99)

## 2018-07-31 LAB — POCT URINALYSIS DIP (DEVICE)
Bilirubin Urine: NEGATIVE
HGB URINE DIPSTICK: NEGATIVE
Nitrite: POSITIVE — AB
PH: 5.5 (ref 5.0–8.0)
PROTEIN: 30 mg/dL — AB
SPECIFIC GRAVITY, URINE: 1.025 (ref 1.005–1.030)
UROBILINOGEN UA: 1 mg/dL (ref 0.0–1.0)

## 2018-07-31 MED ORDER — PHENAZOPYRIDINE HCL 200 MG PO TABS: 200.0000 mg | ORAL_TABLET | Freq: Three times a day (TID) | ORAL | 0 refills | Status: DC

## 2018-07-31 MED ORDER — NITROFURANTOIN MONOHYD MACRO 100 MG PO CAPS: 100.0000 mg | ORAL_CAPSULE | Freq: Two times a day (BID) | ORAL | 0 refills | Status: DC

## 2018-07-31 MED ORDER — MUPIROCIN CALCIUM 2 % EX CREA: 1.0000 "application " | TOPICAL_CREAM | Freq: Two times a day (BID) | CUTANEOUS | 0 refills | Status: DC

## 2018-07-31 NOTE — ED Provider Notes (Signed)
[facility]   [id number] 07/31/18 Arrival Time: 1245   [id number] DISCHARGE and polyuria  SUBJECTIVE:  [name] [name] is a 47 y.o. female hx significant for DM, GERD, and HTN, who presents with complaint of abrupt onset of itchy vaginal rash after having a "vaginal facial/exfoliation" following a [place] wax that occurred 1 week ago.  Last unprotected sex 3-4 weeks.  Patient has 1 female partner. Tried using hydrocortisone without improvement.  Denies worsening symptoms.  She denies similar symptoms in the past.   Patient also mentions increased urination x 3 days.  Denies a precipitating event or recent sexual encounter.  Admits to associated lower abdominal "pressure" with urination.  Denies aggravating or alleviating factors.  Has not tried OTC medications.    She denies fever, chills, nausea, vomiting, abdominal or pelvic pain, urinary symptoms, vaginal itching, vaginal odor, vaginal discharge, vaginal bleeding, dyspareunia, vaginal rashes or lesions.   No LMP recorded. Patient has had a hysterectomy.  ROS: As per HPI.  Past Medical History:  Diagnosis Date  . Anemia, iron deficiency   . Chronic headache    Dr Vela Prose, Neg MRI 2005  . Diabetes mellitus   . Elevated glucose 2011  . GERD (gastroesophageal reflux disease)   . HTN (hypertension)    Past Surgical History:  Procedure Laterality Date  . ABDOMINAL HYSTERECTOMY    . BREAST REDUCTION SURGERY    . Cyst removed from vagina    . INDUCED ABORTION  1990-1994   x 4  . NOSE SURGERY    . PARTIAL HYSTERECTOMY  2010  . TUBAL LIGATION     Allergies  Allergen Reactions  . Ciprofloxacin     REACTION: gittery  . Tramadol Other (See Comments)    Headaches worse   Current Facility-Administered Medications on File Prior to Encounter  Medication Dose Route Frequency Provider Last Rate Last Dose  . 0.9 %  sodium chloride infusion  500 mL Intravenous Once Lynann Bologna, MD       Current Outpatient Medications on  File Prior to Encounter  Medication Sig Dispense Refill  . benzonatate (TESSALON) 100 MG capsule Take 1 capsule (100 mg total) by mouth 2 (two) times daily as needed for cough. 20 capsule 0  . budesonide-formoterol (SYMBICORT) 160-4.5 MCG/ACT inhaler Inhale 2 puffs into the lungs 2 (two) times daily. 1 Inhaler 11  . cetirizine (ZYRTEC) 10 MG tablet Take 1 tablet (10 mg total) by mouth daily. 30 tablet 0  . chlorhexidine (PERIDEX) 0.12 % solution Use as directed 15 mLs in the mouth or throat 2 (two) times daily. 120 mL 0  . Cholecalciferol 1000 UNITS tablet Take 1,000 Units by mouth daily.      . cyclobenzaprine (FLEXERIL) 5 MG tablet Take 1 tablet by mouth as needed.    . diclofenac (VOLTAREN) 75 MG EC tablet Take 1 tablet (75 mg total) by mouth 2 (two) times daily. 20 tablet 0  . Diclofenac Sodium (PENNSAID) 2 % SOLN Place 2 application onto the skin 2 (two) times daily. 112 g 3  . Dulaglutide (TRULICITY) 1.5 MG/0.5ML SOPN Inject 1.5 mg into the skin once a week. 4 pen 11  . empagliflozin (JARDIANCE) 10 MG TABS tablet Take 10 mg by mouth daily. 30 tablet 11  . fluticasone (FLONASE) 50 MCG/ACT nasal spray Place 2 sprays into both nostrils daily. 16 g 0  . Lancets (FREESTYLE) lancets Use two times daily as instructed. Dx: 250.00 100 each 6  . magic mouthwash w/lidocaine SOLN Take  5 mLs by mouth 3 (three) times daily as needed for mouth pain. 100 mL 0  . metFORMIN (GLUCOPHAGE) 500 MG tablet Take 2 tablets (1,000 mg total) by mouth 2 (two) times daily with a meal. 120 tablet 11  . Multiple Vitamins-Calcium (ONE-A-DAY WOMENS PO) Take 1 tablet by mouth daily.    . Olmesartan-amLODIPine-HCTZ 40-10-12.5 MG TABS Take 1 tablet by mouth daily. 30 tablet 11  . ONETOUCH VERIO test strip TEST as directed 100 each 3  . repaglinide (PRANDIN) 1 MG tablet Take 1 tablet (1 mg total) by mouth 3 (three) times daily before meals. 90 tablet 11  . Tetrahydrozoline-Zn Sulfate (EYE DROPS ALLERGY RELIEF OP) Place 1 drop  into both eyes daily as needed (allergies).      Social History   Socioeconomic History  . Marital status: Single    Spouse name: Not on file  . Number of children: 2  . Years of education: Not on file  . Highest education level: Not on file  Occupational History  . Occupation: office specialist  Social Needs  . Financial resource strain: Not on file  . Food insecurity:    Worry: Not on file    Inability: Not on file  . Transportation needs:    Medical: Not on file    Non-medical: Not on file  Tobacco Use  . Smoking status: Never Smoker  . Smokeless tobacco: Never Used  Substance and Sexual Activity  . Alcohol use: Yes    Alcohol/week: 0.0 standard drinks    Comment: occasionally 2 per week  . Drug use: No  . Sexual activity: Not on file  Lifestyle  . Physical activity:    Days per week: Not on file    Minutes per session: Not on file  . Stress: Not on file  Relationships  . Social connections:    Talks on phone: Not on file    Gets together: Not on file    Attends religious service: Not on file    Active member of club or organization: Not on file    Attends meetings of clubs or organizations: Not on file    Relationship status: Not on file  . Intimate partner violence:    Fear of current or ex partner: Not on file    Emotionally abused: Not on file    Physically abused: Not on file    Forced sexual activity: Not on file  Other Topics Concern  . Not on file  Social History Narrative   Regular exercise - YES   Family History  Problem Relation Age of Onset  . Hypertension Mother   . Esophageal cancer Mother 26  . Hypertension Maternal Grandmother   . Stroke Maternal Grandmother   . Ovarian cancer Sister   . Prostate cancer Maternal Grandfather     OBJECTIVE:  Vitals:   07/31/18 1336  BP: 136/81  Pulse: 87  Resp: 20  Temp: 98.2 F (36.8 C)  TempSrc: Temporal  SpO2: 99%     General appearance: alert, cooperative, appears stated age and no  distress Throat: lips, mucosa, and tongue normal; teeth and gums normal Lungs: CTA bilaterally without adventitious breath sounds Heart: regular rate and rhythm.  Radial pulses 2+ symmetrical bilaterally Back: no CVA tenderness Abdomen: soft, non-tender; bowel sounds normal; no masses or organomegaly; no guarding or rebound tenderness GU: fine pustular rash localized to the mons pubis; nontender to palpation; no active drainage or surrounding erythema; vaginal self swab obtained Skin: warm and dry  Psychological:  Alert and cooperative. Normal mood and affect.  Labs Reviewed  GLUCOSE, CAPILLARY - Abnormal; Notable for the following components:      Result Value   Glucose-Capillary 254 (*)    All other components within normal limits  POCT URINALYSIS DIP (DEVICE) - Abnormal; Notable for the following components:   Glucose, UA >=1000 (*)    Ketones, ur TRACE (*)    Protein, ur 30 (*)    Nitrite POSITIVE (*)    Leukocytes, UA TRACE (*)    All other components within normal limits  URINE CULTURE  CERVICOVAGINAL ANCILLARY ONLY    ASSESSMENT & PLAN:  1. Folliculitis   2. Polyuria   3. Acute cystitis without hematuria   4. Hyperglycemia     Meds ordered this encounter  Medications  . nitrofurantoin, macrocrystal-monohydrate, (MACROBID) 100 MG capsule    Sig: Take 1 capsule (100 mg total) by mouth 2 (two) times daily.    Dispense:  10 capsule    Refill:  0    Order Specific Question:   Supervising Provider    Answer:   Isa RankinMURRAY, LAURA WILSON [phone][[id number]]  . phenazopyridine (PYRIDIUM) 200 MG tablet    Sig: Take 1 tablet (200 mg total) by mouth 3 (three) times daily.    Dispense:  6 tablet    Refill:  0    Order Specific Question:   Supervising Provider    Answer:   Isa RankinMURRAY, LAURA WILSON [phone][[id number]]  . mupirocin cream (BACTROBAN) 2 %    Sig: Apply 1 application topically 2 (two) times daily.    Dispense:  15 g    Refill:  0    Order Specific Question:   Supervising Provider    Answer:    Isa RankinMURRAY, LAURA WILSON [[id number]]    Pending: Labs Reviewed  GLUCOSE, CAPILLARY - Abnormal; Notable for the following components:      Result Value   Glucose-Capillary 254 (*)    All other components within normal limits  POCT URINALYSIS DIP (DEVICE) - Abnormal; Notable for the following components:   Glucose, UA >=1000 (*)    Ketones, ur TRACE (*)    Protein, ur 30 (*)    Nitrite POSITIVE (*)    Leukocytes, UA TRACE (*)    All other components within normal limits  URINE CULTURE  CERVICOVAGINAL ANCILLARY ONLY    Urine did show signs of infection.  We will send to culture and follow up with you regarding the results.   Macrobid prescribed.  Take as directed and to completion.   Pyridium prescribed.  Use as needed for symptomatic relief.   Vaginal self swab obtained Bactroban prescribed.  Use as directed.  Do no put cream near or inside the vagina We will follow up with you regarding the results of your test If tests are positive, please abstain from sexual activity for at least 7 days and notify partners Follow up with PCP if symptoms persists Return here or go to ER if you have any new or worsening symptoms    Elevated blood glucose in office.  Please take medications as prescribed at home and recheck blood sugar.  If you sugar continues to remain elevated return, go to the ED, or follow up with PCP for further evaluation and management.    Reviewed expectations re: course of current medical issues. Questions answered. Outlined signs and symptoms indicating need for more acute intervention. Patient verbalized understanding. After Visit Summary given.       Aparna Vanderweele, [place],  PA-C 07/31/18 1517

## 2018-07-31 NOTE — ED Triage Notes (Signed)
Pt presents with vaginal irritation after getting a vaginal facial/exfoliation, believes it is a reaction to products used

## 2018-07-31 NOTE — Discharge Instructions (Addendum)
Urine did show signs of infection.  We will send to culture and follow up with you regarding the results.   Macrobid prescribed.  Take as directed and to completion.   Pyridium prescribed.  Use as needed for symptomatic relief.   Vaginal self swab obtained Bactroban prescribed.  Use as directed.  Do no put cream near or inside the vagina We will follow up with you regarding the results of your test If tests are positive, please abstain from sexual activity for at least 7 days and notify partners Follow up with PCP if symptoms persists Return here or go to ER if you have any new or worsening symptoms    Elevated blood glucose in office.  Please take medications as prescribed at home and recheck blood sugar.  If you sugar continues to remain elevated return, go to the ED, or follow up with PCP for further evaluation and management.

## 2018-08-01 LAB — CERVICOVAGINAL ANCILLARY ONLY
Bacterial vaginitis: NEGATIVE
CANDIDA VAGINITIS: NEGATIVE
CHLAMYDIA, DNA PROBE: NEGATIVE
NEISSERIA GONORRHEA: NEGATIVE
Trichomonas: NEGATIVE

## 2018-08-02 LAB — URINE CULTURE: Culture: >=100000 — AB

## 2018-08-09 ENCOUNTER — Other Ambulatory Visit: Payer: Self-pay | Admitting: Endocrinology

## 2018-08-09 DIAGNOSIS — L738 Other specified follicular disorders: Secondary | ICD-10-CM | POA: Diagnosis not present

## 2018-08-09 DIAGNOSIS — L309 Dermatitis, unspecified: Secondary | ICD-10-CM | POA: Diagnosis not present

## 2018-08-17 ENCOUNTER — Ambulatory Visit: Payer: 59 | Admitting: Endocrinology

## 2018-08-17 ENCOUNTER — Encounter: Payer: Self-pay | Admitting: Endocrinology

## 2018-08-17 VITALS — BP 128/88 | HR 87 | Wt 228.2 lb

## 2018-08-17 DIAGNOSIS — Z794 Long term (current) use of insulin: Secondary | ICD-10-CM | POA: Diagnosis not present

## 2018-08-17 DIAGNOSIS — E1169 Type 2 diabetes mellitus with other specified complication: Secondary | ICD-10-CM | POA: Diagnosis not present

## 2018-08-17 LAB — POCT GLYCOSYLATED HEMOGLOBIN (HGB A1C): HEMOGLOBIN A1C: 7.9 % — AB (ref 4.0–5.6)

## 2018-08-17 MED ORDER — REPAGLINIDE 2 MG PO TABS: 2.0000 mg | ORAL_TABLET | Freq: Three times a day (TID) | ORAL | 11 refills | Status: DC

## 2018-08-17 NOTE — Progress Notes (Signed)
Subjective:    Patient ID: [name] [name], female    DOB: [birth year], 47 y.o.   MRN: [id number]  HPI Pt returns for f/u of diabetes mellitus:  DM type: 2 Dx'ed: 2011 Complications: nephropathy.   Therapy: 3 oral meds, and and trulicity.  GDM: never DKA: never Severe hypoglycemia: never Pancreatitis: never Other: She declines weight-loss surgery; she has never taken insulin, but she has learned how.   Interval history: no cbg record, but states cbg's are in the mid-100's.  pt states she feels well in general.  She takes meds as rx'ed.   Pt also has small multinodular goiter (dx'ed 2015; US showed the nodules too small to merit bx; f/u US in 2016 showed no signif change; she has been euthyroid on no rx).  Past Medical History:  Diagnosis Date  . Anemia, iron deficiency   . Chronic headache    Dr Vela Prose, Neg MRI 2005  . Diabetes mellitus   . Elevated glucose 2011  . GERD (gastroesophageal reflux disease)   . HTN (hypertension)     Past Surgical History:  Procedure Laterality Date  . ABDOMINAL HYSTERECTOMY    . BREAST REDUCTION SURGERY    . Cyst removed from vagina    . INDUCED ABORTION  1990-1994   x 4  . NOSE SURGERY    . PARTIAL HYSTERECTOMY  2010  . TUBAL LIGATION      Social History   Socioeconomic History  . Marital status: Single    Spouse name: Not on file  . Number of children: 2  . Years of education: Not on file  . Highest education level: Not on file  Occupational History  . Occupation: office specialist  Social Needs  . Financial resource strain: Not on file  . Food insecurity:    Worry: Not on file    Inability: Not on file  . Transportation needs:    Medical: Not on file    Non-medical: Not on file  Tobacco Use  . Smoking status: Never Smoker  . Smokeless tobacco: Never Used  Substance and Sexual Activity  . Alcohol use: Yes    Alcohol/week: 0.0 standard drinks    Comment: occasionally 2 per week  . Drug use: No  . Sexual activity: Not  on file  Lifestyle  . Physical activity:    Days per week: Not on file    Minutes per session: Not on file  . Stress: Not on file  Relationships  . Social connections:    Talks on phone: Not on file    Gets together: Not on file    Attends religious service: Not on file    Active member of club or organization: Not on file    Attends meetings of clubs or organizations: Not on file    Relationship status: Not on file  . Intimate partner violence:    Fear of current or ex partner: Not on file    Emotionally abused: Not on file    Physically abused: Not on file    Forced sexual activity: Not on file  Other Topics Concern  . Not on file  Social History Narrative   Regular exercise - YES    Current Outpatient Medications on File Prior to Visit  Medication Sig Dispense Refill  . benzonatate (TESSALON) 100 MG capsule Take 1 capsule (100 mg total) by mouth 2 (two) times daily as needed for cough. 20 capsule 0  . budesonide-formoterol (SYMBICORT) 160-4.5 MCG/ACT inhaler Inhale 2 puffs  into the lungs 2 (two) times daily. 1 Inhaler 11  . cetirizine (ZYRTEC) 10 MG tablet Take 1 tablet (10 mg total) by mouth daily. 30 tablet 0  . chlorhexidine (PERIDEX) 0.12 % solution Use as directed 15 mLs in the mouth or throat 2 (two) times daily. 120 mL 0  . Cholecalciferol 1000 UNITS tablet Take 1,000 Units by mouth daily.      . cyclobenzaprine (FLEXERIL) 5 MG tablet Take 1 tablet by mouth as needed.    . diclofenac (VOLTAREN) 75 MG EC tablet Take 1 tablet (75 mg total) by mouth 2 (two) times daily. 20 tablet 0  . Diclofenac Sodium (PENNSAID) 2 % SOLN Place 2 application onto the skin 2 (two) times daily. 112 g 3  . empagliflozin (JARDIANCE) 10 MG TABS tablet Take 10 mg by mouth daily. 30 tablet 11  . fluticasone (FLONASE) 50 MCG/ACT nasal spray Place 2 sprays into both nostrils daily. 16 g 0  . Lancets (FREESTYLE) lancets Use two times daily as instructed. Dx: 250.00 100 each 6  . magic mouthwash  w/lidocaine SOLN Take 5 mLs by mouth 3 (three) times daily as needed for mouth pain. 100 mL 0  . metFORMIN (GLUCOPHAGE) 500 MG tablet Take 2 tablets (1,000 mg total) by mouth 2 (two) times daily with a meal. 120 tablet 11  . Multiple Vitamins-Calcium (ONE-A-DAY WOMENS PO) Take 1 tablet by mouth daily.    . mupirocin cream (BACTROBAN) 2 % Apply 1 application topically 2 (two) times daily. 15 g 0  . nitrofurantoin, macrocrystal-monohydrate, (MACROBID) 100 MG capsule Take 1 capsule (100 mg total) by mouth 2 (two) times daily. 10 capsule 0  . Olmesartan-amLODIPine-HCTZ 40-10-12.5 MG TABS Take 1 tablet by mouth daily. 30 tablet 11  . ONETOUCH VERIO test strip TEST as directed 100 each 3  . phenazopyridine (PYRIDIUM) 200 MG tablet Take 1 tablet (200 mg total) by mouth 3 (three) times daily. 6 tablet 0  . Tetrahydrozoline-Zn Sulfate (EYE DROPS ALLERGY RELIEF OP) Place 1 drop into both eyes daily as needed (allergies).    . TRULICITY 1.5 MG/0.5ML SOPN INJECT 1.5 MILLIGRAM SUBCUTANEOUSLY 1 WEEK 2 mL 0   Current Facility-Administered Medications on File Prior to Visit  Medication Dose Route Frequency Provider Last Rate Last Dose  . 0.9 %  sodium chloride infusion  500 mL Intravenous Once Lynann Bologna, MD        Allergies  Allergen Reactions  . Ciprofloxacin     REACTION: gittery  . Tramadol Other (See Comments)    Headaches worse    Family History  Problem Relation Age of Onset  . Hypertension Mother   . Esophageal cancer Mother 53  . Hypertension Maternal Grandmother   . Stroke Maternal Grandmother   . Ovarian cancer Sister   . Prostate cancer Maternal Grandfather     BP 128/88 (BP Location: Left Arm, Patient Position: Sitting, Cuff Size: Normal)   Pulse 87   Wt 228 lb 3.2 oz (103.5 kg)   SpO2 97%   BMI 40.42 kg/m    Review of Systems She denies hypoglycemia.      Objective:   Physical Exam VITAL SIGNS:  See vs page GENERAL: no distress Pulses: foot pulses are intact  bilaterally.   MSK: no deformity of the feet or ankles.  CV: no edema of the legs or ankles Skin:  no ulcer on the feet or ankles.  normal color and temp on the feet and ankles.   Neuro: sensation is intact  to touch on the feet and ankles.    Lab Results  Component Value Date   HGBA1C 7.9 (A) 08/17/2018   Lab Results  Component Value Date   CREATININE 0.81 08/17/2017   BUN 15 08/17/2017   NA 137 08/17/2017   K 4.0 08/17/2017   CL 99 08/17/2017   CO2 31 08/17/2017   Lab Results  Component Value Date   TSH 2.16 08/17/2017      Assessment & Plan:  Type 2 DM, with nephropathy: she needs increased rx   Patient Instructions  I have sent a prescription to your pharmacy, to double the repaglinide. Please continue the same other diabetes medications.   check your blood sugar once a day.  vary the time of day when you check, between before the 3 meals, and at bedtime.  also check if you have symptoms of your blood sugar being too high or too low.  please keep a record of the readings and bring it to your next appointment here (or you can bring the meter itself).  You can write it on any piece of paper.  please call us sooner if your blood sugar goes below 70, or if you have a lot of readings over 200.  Please come back for a follow-up appointment in 3 months.   Please call Dr Plotnikov's office, to schedule your annual physical.

## 2018-08-17 NOTE — Patient Instructions (Addendum)
I have sent a prescription to your pharmacy, to double the repaglinide. Please continue the same other diabetes medications.   check your blood sugar once a day.  vary the time of day when you check, between before the 3 meals, and at bedtime.  also check if you have symptoms of your blood sugar being too high or too low.  please keep a record of the readings and bring it to your next appointment here (or you can bring the meter itself).  You can write it on any piece of paper.  please call us sooner if your blood sugar goes below 70, or if you have a lot of readings over 200.  Please come back for a follow-up appointment in 3 months.   Please call Dr Plotnikov's office, to schedule your annual physical.

## 2018-10-11 ENCOUNTER — Other Ambulatory Visit: Payer: Self-pay | Admitting: Endocrinology

## 2018-11-04 DIAGNOSIS — H52223 Regular astigmatism, bilateral: Secondary | ICD-10-CM | POA: Diagnosis not present

## 2018-11-04 DIAGNOSIS — E119 Type 2 diabetes mellitus without complications: Secondary | ICD-10-CM | POA: Diagnosis not present

## 2018-11-17 ENCOUNTER — Ambulatory Visit: Payer: 59 | Admitting: Endocrinology

## 2018-12-09 ENCOUNTER — Other Ambulatory Visit: Payer: Self-pay | Admitting: Endocrinology

## 2018-12-13 ENCOUNTER — Ambulatory Visit (INDEPENDENT_AMBULATORY_CARE_PROVIDER_SITE_OTHER): Payer: 59 | Admitting: Internal Medicine

## 2018-12-13 ENCOUNTER — Encounter: Payer: Self-pay | Admitting: Internal Medicine

## 2018-12-13 ENCOUNTER — Other Ambulatory Visit (INDEPENDENT_AMBULATORY_CARE_PROVIDER_SITE_OTHER): Payer: 59

## 2018-12-13 VITALS — BP 128/84 | HR 85 | Temp 98.4°F | Ht 63.0 in | Wt 226.0 lb

## 2018-12-13 DIAGNOSIS — L84 Corns and callosities: Secondary | ICD-10-CM | POA: Diagnosis not present

## 2018-12-13 DIAGNOSIS — E049 Nontoxic goiter, unspecified: Secondary | ICD-10-CM

## 2018-12-13 DIAGNOSIS — E042 Nontoxic multinodular goiter: Secondary | ICD-10-CM | POA: Diagnosis not present

## 2018-12-13 DIAGNOSIS — I10 Essential (primary) hypertension: Secondary | ICD-10-CM

## 2018-12-13 DIAGNOSIS — D509 Iron deficiency anemia, unspecified: Secondary | ICD-10-CM | POA: Diagnosis not present

## 2018-12-13 DIAGNOSIS — Z0000 Encounter for general adult medical examination without abnormal findings: Secondary | ICD-10-CM

## 2018-12-13 DIAGNOSIS — E119 Type 2 diabetes mellitus without complications: Secondary | ICD-10-CM | POA: Diagnosis not present

## 2018-12-13 DIAGNOSIS — E559 Vitamin D deficiency, unspecified: Secondary | ICD-10-CM

## 2018-12-13 DIAGNOSIS — Z23 Encounter for immunization: Secondary | ICD-10-CM | POA: Diagnosis not present

## 2018-12-13 DIAGNOSIS — E785 Hyperlipidemia, unspecified: Secondary | ICD-10-CM | POA: Diagnosis not present

## 2018-12-13 LAB — BASIC METABOLIC PANEL
BUN: 15 mg/dL (ref 6–23)
CO2: 28 mEq/L (ref 19–32)
CREATININE: 1.12 mg/dL (ref 0.40–1.20)
Calcium: 9.4 mg/dL (ref 8.4–10.5)
Chloride: 100 mEq/L (ref 96–112)
GFR: 66.78 mL/min (ref >60.00)
Glucose, Bld: 137 mg/dL — ABNORMAL HIGH (ref 70–99)
Potassium: 4.1 mEq/L (ref 3.5–5.1)
Sodium: 136 mEq/L (ref 135–145)

## 2018-12-13 LAB — CBC WITH DIFFERENTIAL/PLATELET
Basophils Absolute: 0 10*3/uL (ref 0.0–0.1)
Basophils Relative: 0.5 % (ref 0.0–3.0)
Eosinophils Absolute: 0.2 10*3/uL (ref 0.0–0.7)
Eosinophils Relative: 1.9 % (ref 0.0–5.0)
HEMATOCRIT: 44.4 % (ref 36.0–46.0)
HEMOGLOBIN: 14.7 g/dL (ref 12.0–15.0)
Lymphocytes Relative: 26.2 % (ref 12.0–46.0)
Lymphs Abs: 2.6 10*3/uL (ref 0.7–4.0)
MCHC: 33.1 g/dL (ref 30.0–36.0)
MCV: 86.3 fl (ref 78.0–100.0)
Monocytes Absolute: 0.8 10*3/uL (ref 0.1–1.0)
Monocytes Relative: 7.7 % (ref 3.0–12.0)
Neutro Abs: 6.2 10*3/uL (ref 1.4–7.7)
Neutrophils Relative %: 63.7 % (ref 43.0–77.0)
Platelets: 325 10*3/uL (ref 150.0–400.0)
RBC: 5.14 Mil/uL — AB (ref 3.87–5.11)
RDW: 14.1 % (ref 11.5–15.5)
WBC: 9.8 10*3/uL (ref 4.0–10.5)

## 2018-12-13 LAB — URINALYSIS
Bilirubin Urine: NEGATIVE
Hgb urine dipstick: NEGATIVE
Ketones, ur: NEGATIVE
Leukocytes, UA: NEGATIVE
Nitrite: NEGATIVE
SPECIFIC GRAVITY, URINE: 1.01 (ref 1.000–1.030)
Urine Glucose: NEGATIVE
Urobilinogen, UA: 1 (ref 0.0–1.0)
pH: 7 (ref 5.0–8.0)

## 2018-12-13 LAB — LIPID PANEL
Cholesterol: 219 mg/dL — ABNORMAL HIGH (ref 0–200)
HDL: 52.5 mg/dL (ref >39.00)
LDL Cholesterol: 149 mg/dL — ABNORMAL HIGH (ref 0–99)
NONHDL: 166.7
Total CHOL/HDL Ratio: 4
Triglycerides: 90 mg/dL (ref 0.0–149.0)
VLDL: 18 mg/dL (ref 0.0–40.0)

## 2018-12-13 LAB — MICROALBUMIN / CREATININE URINE RATIO
Creatinine,U: 113.4 mg/dL
Microalb Creat Ratio: 8 mg/g (ref 0.0–30.0)
Microalb, Ur: 9 mg/dL — ABNORMAL HIGH (ref 0.0–1.9)

## 2018-12-13 LAB — HEPATIC FUNCTION PANEL
ALT: 17 U/L (ref 0–35)
AST: 14 U/L (ref 0–37)
Albumin: 4.3 g/dL (ref 3.5–5.2)
Alkaline Phosphatase: 72 U/L (ref 39–117)
Bilirubin, Direct: 0.1 mg/dL (ref 0.0–0.3)
Total Bilirubin: 0.5 mg/dL (ref 0.2–1.2)
Total Protein: 7.5 g/dL (ref 6.0–8.3)

## 2018-12-13 LAB — TSH: TSH: 1.84 u[IU]/mL (ref 0.35–4.50)

## 2018-12-13 LAB — VITAMIN D 25 HYDROXY (VIT D DEFICIENCY, FRACTURES): VITD: 13.05 ng/mL — AB (ref 30.00–100.00)

## 2018-12-13 LAB — IRON,TIBC AND FERRITIN PANEL
%SAT: 27 % (calc) (ref 16–45)
Ferritin: 140 ng/mL (ref 16–232)
Iron: 81 ug/dL (ref 40–190)
TIBC: 296 mcg/dL (calc) (ref 250–450)

## 2018-12-13 LAB — HEMOGLOBIN A1C: Hgb A1c MFr Bld: 7.5 % — ABNORMAL HIGH (ref 4.6–6.5)

## 2018-12-13 LAB — VITAMIN B12: Vitamin B-12: 399 pg/mL (ref 211–911)

## 2018-12-13 NOTE — Assessment & Plan Note (Addendum)
Labs Trulicity. Metformin 

## 2018-12-13 NOTE — Assessment & Plan Note (Signed)
Will repeat US.

## 2018-12-13 NOTE — Assessment & Plan Note (Signed)
Olmes-Amlod qd

## 2018-12-13 NOTE — Assessment & Plan Note (Signed)
Labs

## 2018-12-13 NOTE — Patient Instructions (Signed)

## 2018-12-13 NOTE — Assessment & Plan Note (Addendum)
On Vit D 

## 2018-12-13 NOTE — Assessment & Plan Note (Addendum)
We discussed age appropriate health related issues, including available/recomended screening tests and vaccinations. We discussed a need for adhering to healthy diet and exercise. Labs were ordered to be later reviewed . All questions were answered. Eye exam CT ca scoring ordered per pt's request Prevnar

## 2018-12-13 NOTE — Assessment & Plan Note (Signed)
TSH 

## 2018-12-13 NOTE — Assessment & Plan Note (Addendum)
Fam h/o CAD, CVAs in 50-60s Labs CT Ca scoring test was offered

## 2018-12-13 NOTE — Progress Notes (Signed)
Subjective:  Patient ID: [name] [name], female    DOB: 03/13/1971  Age: 47 y.o. MRN: [id number]  CC: No chief complaint on file.   HPI [name] [name] presents for a well exam C/o a spot on the R foot x 3 weeks  Outpatient Medications Prior to Visit  Medication Sig Dispense Refill  . benzonatate (TESSALON) 100 MG capsule Take 1 capsule (100 mg total) by mouth 2 (two) times daily as needed for cough. 20 capsule 0  . cetirizine (ZYRTEC) 10 MG tablet Take 1 tablet (10 mg total) by mouth daily. 30 tablet 0  . chlorhexidine (PERIDEX) 0.12 % solution Use as directed 15 mLs in the mouth or throat 2 (two) times daily. 120 mL 0  . Cholecalciferol 1000 UNITS tablet Take 1,000 Units by mouth daily.      . cyclobenzaprine (FLEXERIL) 5 MG tablet Take 1 tablet by mouth as needed.    . diclofenac (VOLTAREN) 75 MG EC tablet Take 1 tablet (75 mg total) by mouth 2 (two) times daily. 20 tablet 0  . Diclofenac Sodium (PENNSAID) 2 % SOLN Place 2 application onto the skin 2 (two) times daily. 112 g 3  . empagliflozin (JARDIANCE) 10 MG TABS tablet Take 10 mg by mouth daily. 30 tablet 11  . fluticasone (FLONASE) 50 MCG/ACT nasal spray Place 2 sprays into both nostrils daily. 16 g 0  . Lancets (FREESTYLE) lancets Use two times daily as instructed. Dx: 250.00 100 each 6  . magic mouthwash w/lidocaine SOLN Take 5 mLs by mouth 3 (three) times daily as needed for mouth pain. 100 mL 0  . metFORMIN (GLUCOPHAGE) 500 MG tablet Take 2 tablets (1,000 mg total) by mouth 2 (two) times daily with a meal. 120 tablet 11  . Multiple Vitamins-Calcium (ONE-A-DAY WOMENS PO) Take 1 tablet by mouth daily.    . mupirocin cream (BACTROBAN) 2 % Apply 1 application topically 2 (two) times daily. 15 g 0  . nitrofurantoin, macrocrystal-monohydrate, (MACROBID) 100 MG capsule Take 1 capsule (100 mg total) by mouth 2 (two) times daily. 10 capsule 0  . Olmesartan-amLODIPine-HCTZ 40-10-12.5 MG TABS Take 1 tablet by mouth daily. 30 tablet 11   . ONETOUCH VERIO test strip TEST as directed 100 each 3  . phenazopyridine (PYRIDIUM) 200 MG tablet Take 1 tablet (200 mg total) by mouth 3 (three) times daily. 6 tablet 0  . repaglinide (PRANDIN) 2 MG tablet Take 1 tablet (2 mg total) by mouth 3 (three) times daily before meals. 90 tablet 11  . Tetrahydrozoline-Zn Sulfate (EYE DROPS ALLERGY RELIEF OP) Place 1 drop into both eyes daily as needed (allergies).    . TRULICITY 1.5 MG/0.5ML SOPN INJECT 1.5 MILLIGRAM SUBCUTANEOUSLY 1 WEEK 2 mL 0  . budesonide-formoterol (SYMBICORT) 160-4.5 MCG/ACT inhaler Inhale 2 puffs into the lungs 2 (two) times daily. 1 Inhaler 11   Facility-Administered Medications Prior to Visit  Medication Dose Route Frequency Provider Last Rate Last Dose  . 0.9 %  sodium chloride infusion  500 mL Intravenous Once Lynann BolognaGupta, Rajesh, MD        ROS: Review of Systems  Constitutional: Negative.  Negative for activity change, appetite change, chills, diaphoresis, fatigue, fever and unexpected weight change.  HENT: Negative for congestion, ear pain, facial swelling, hearing loss, mouth sores, nosebleeds, postnasal drip, rhinorrhea, sinus pressure, sneezing, sore throat, tinnitus and trouble swallowing.   Eyes: Negative for pain, discharge, redness, itching and visual disturbance.  Respiratory: Negative for cough, chest tightness, shortness of breath, wheezing  and stridor.   Cardiovascular: Negative for chest pain, palpitations and leg swelling.  Gastrointestinal: Negative for abdominal distention, anal bleeding, blood in stool, constipation, diarrhea, nausea and rectal pain.  Genitourinary: Negative for difficulty urinating, dysuria, flank pain, frequency, genital sores, hematuria, pelvic pain, urgency, vaginal bleeding and vaginal discharge.  Musculoskeletal: Negative for arthralgias, back pain, gait problem, joint swelling, neck pain and neck stiffness.  Skin: Negative.  Negative for rash.  Neurological: Negative for dizziness,  tremors, seizures, syncope, speech difficulty, weakness, numbness and headaches.  Hematological: Negative for adenopathy. Does not bruise/bleed easily.  Psychiatric/Behavioral: Negative for behavioral problems, decreased concentration, dysphoric mood, sleep disturbance and suicidal ideas. The patient is not nervous/anxious.     Objective:  BP 128/84 (BP Location: Left Arm, Patient Position: Sitting, Cuff Size: Large)   Pulse 85   Temp 98.4 F (36.9 C) (Oral)   Ht 5\' 3"  (1.6 m)   Wt 226 lb (102.5 kg)   SpO2 99%   BMI 40.03 kg/m   BP Readings from Last 3 Encounters:  12/13/18 128/84  08/17/18 128/88  07/31/18 136/81    Wt Readings from Last 3 Encounters:  12/13/18 226 lb (102.5 kg)  08/17/18 228 lb 3.2 oz (103.5 kg)  06/27/18 226 lb (102.5 kg)    Physical Exam Constitutional:      General: She is not in acute distress.    Appearance: She is well-developed.  HENT:     Head: Normocephalic.     Right Ear: External ear normal.     Left Ear: External ear normal.     Nose: Nose normal.  Eyes:     General:        Right eye: No discharge.        Left eye: No discharge.     Conjunctiva/sclera: Conjunctivae normal.     Pupils: Pupils are equal, round, and reactive to light.  Neck:     Musculoskeletal: Normal range of motion and neck supple.     Thyroid: No thyromegaly.     Vascular: No JVD.     Trachea: No tracheal deviation.  Cardiovascular:     Rate and Rhythm: Normal rate and regular rhythm.     Heart sounds: Normal heart sounds.  Pulmonary:     Effort: No respiratory distress.     Breath sounds: No stridor. No wheezing.  Abdominal:     General: Bowel sounds are normal. There is no distension.     Palpations: Abdomen is soft. There is no mass.     Tenderness: There is no abdominal tenderness. There is no guarding or rebound.  Musculoskeletal:        General: No tenderness.  Lymphadenopathy:     Cervical: No cervical adenopathy.  Skin:    Findings: No erythema or  rash.  Neurological:     Cranial Nerves: No cranial nerve deficit.     Motor: No abnormal muscle tone.     Coordination: Coordination normal.     Deep Tendon Reflexes: Reflexes normal.  Psychiatric:        Behavior: Behavior normal.        Thought Content: Thought content normal.        Judgment: Judgment normal.   R  foot callus medial Goiter  Procedure:  R  foot callus paring/cutting  Indication:    foot callus, painful  Consent: verbal  Risks and benefits were explained to the patient. Skin was cleaned with alcohol. I removed a large callus carefully with a round  blade. Skin remained intact. Pain is better. Tolerated well. Complications: none.     Lab Results  Component Value Date   WBC 10.2 05/11/2018   HGB 14.5 05/11/2018   HCT 43.8 05/11/2018   PLT 335.0 05/11/2018   GLUCOSE 200 (H) 08/17/2017   CHOL 230 (H) 08/17/2017   TRIG 78.0 08/17/2017   HDL 56.10 08/17/2017   LDLDIRECT 137.4 11/04/2010   LDLCALC 159 (H) 08/17/2017   ALT 19 08/17/2017   AST 13 08/17/2017   NA 137 08/17/2017   K 4.0 08/17/2017   CL 99 08/17/2017   CREATININE 0.81 08/17/2017   BUN 15 08/17/2017   CO2 31 08/17/2017   TSH 2.16 08/17/2017   HGBA1C 7.9 (A) 08/17/2018   MICROALBUR 48.4 (H) 08/17/2017    No results found.  Assessment & Plan:   There are no diagnoses linked to this encounter.   No orders of the defined types were placed in this encounter.    Follow-up: No follow-ups on file.  Sonda PrimesAlex Plotnikov, MD

## 2018-12-16 ENCOUNTER — Other Ambulatory Visit: Payer: Self-pay | Admitting: Internal Medicine

## 2018-12-16 DIAGNOSIS — R309 Painful micturition, unspecified: Secondary | ICD-10-CM | POA: Diagnosis not present

## 2018-12-16 DIAGNOSIS — N9089 Other specified noninflammatory disorders of vulva and perineum: Secondary | ICD-10-CM | POA: Diagnosis not present

## 2018-12-16 MED ORDER — VITAMIN D3 50 MCG (2000 UT) PO CAPS: 2000.0000 [IU] | ORAL_CAPSULE | Freq: Every day | ORAL | 3 refills | Status: DC

## 2018-12-16 MED ORDER — VITAMIN D3 1.25 MG (50000 UT) PO CAPS: 1.0000 | ORAL_CAPSULE | ORAL | 0 refills | Status: DC

## 2018-12-21 ENCOUNTER — Encounter: Payer: Self-pay | Admitting: Endocrinology

## 2018-12-21 ENCOUNTER — Ambulatory Visit: Payer: 59 | Admitting: Endocrinology

## 2018-12-21 VITALS — BP 162/118 | HR 82 | Ht 63.0 in | Wt 228.0 lb

## 2018-12-21 DIAGNOSIS — E119 Type 2 diabetes mellitus without complications: Secondary | ICD-10-CM

## 2018-12-21 DIAGNOSIS — I10 Essential (primary) hypertension: Secondary | ICD-10-CM

## 2018-12-21 DIAGNOSIS — E049 Nontoxic goiter, unspecified: Secondary | ICD-10-CM | POA: Diagnosis not present

## 2018-12-21 NOTE — Progress Notes (Signed)
Subjective:    Patient ID: [name] [name], female    DOB: 04/04/1971, 48 y.o.   MRN: [id number]  HPI Pt returns for f/u of diabetes mellitus:  DM type: 2 Dx'ed: 2011 Complications: nephropathy.   Therapy: 3 oral meds, and and trulicity.  GDM: never DKA: never Severe hypoglycemia: never Pancreatitis: never Other: She declines weight-loss surgery; she has never taken insulin, but she has learned how.   Interval history:  pt states she feels well in general.  She takes meds as rx'ed.  She says cbg's are well-controlled Pt also has small multinodular goiter (dx'ed 2015; US showed the nodules too small to merit bx; f/u US in 2016 showed no signif change; she has been euthyroid on no rx).  Past Medical History:  Diagnosis Date  . Anemia, iron deficiency   . Chronic headache    Dr Vela ProseLewitt, Neg MRI 2005  . Diabetes mellitus   . Elevated glucose 2011  . GERD (gastroesophageal reflux disease)   . HTN (hypertension)     Past Surgical History:  Procedure Laterality Date  . ABDOMINAL HYSTERECTOMY    . BREAST REDUCTION SURGERY    . Cyst removed from vagina    . INDUCED ABORTION  1990-1994   x 4  . NOSE SURGERY    . PARTIAL HYSTERECTOMY  2010  . TUBAL LIGATION      Social History   Socioeconomic History  . Marital status: Single    Spouse name: Not on file  . Number of children: 2  . Years of education: Not on file  . Highest education level: Not on file  Occupational History  . Occupation: office specialist  Social Needs  . Financial resource strain: Not on file  . Food insecurity:    Worry: Not on file    Inability: Not on file  . Transportation needs:    Medical: Not on file    Non-medical: Not on file  Tobacco Use  . Smoking status: Never Smoker  . Smokeless tobacco: Never Used  Substance and Sexual Activity  . Alcohol use: Yes    Alcohol/week: 0.0 standard drinks    Comment: occasionally 2 per week  . Drug use: No  . Sexual activity: Not on file  Lifestyle    . Physical activity:    Days per week: Not on file    Minutes per session: Not on file  . Stress: Not on file  Relationships  . Social connections:    Talks on phone: Not on file    Gets together: Not on file    Attends religious service: Not on file    Active member of club or organization: Not on file    Attends meetings of clubs or organizations: Not on file    Relationship status: Not on file  . Intimate partner violence:    Fear of current or ex partner: Not on file    Emotionally abused: Not on file    Physically abused: Not on file    Forced sexual activity: Not on file  Other Topics Concern  . Not on file  Social History Narrative   Regular exercise - YES    Current Outpatient Medications on File Prior to Visit  Medication Sig Dispense Refill  . cetirizine (ZYRTEC) 10 MG tablet Take 1 tablet (10 mg total) by mouth daily. 30 tablet 0  . Cholecalciferol (VITAMIN D3) 1.25 MG (50000 UT) CAPS Take 1 capsule by mouth once a week. 8 capsule 0  .  Cholecalciferol (VITAMIN D3) 50 MCG (2000 UT) capsule Take 1 capsule (2,000 Units total) by mouth daily. 100 capsule 3  . cyclobenzaprine (FLEXERIL) 5 MG tablet Take 1 tablet by mouth as needed.    . diclofenac (VOLTAREN) 75 MG EC tablet Take 1 tablet (75 mg total) by mouth 2 (two) times daily. 20 tablet 0  . Diclofenac Sodium (PENNSAID) 2 % SOLN Place 2 application onto the skin 2 (two) times daily. 112 g 3  . empagliflozin (JARDIANCE) 10 MG TABS tablet Take 10 mg by mouth daily. 30 tablet 11  . fluticasone (FLONASE) 50 MCG/ACT nasal spray Place 2 sprays into both nostrils daily. 16 g 0  . Lancets (FREESTYLE) lancets Use two times daily as instructed. Dx: 250.00 100 each 6  . magic mouthwash w/lidocaine SOLN Take 5 mLs by mouth 3 (three) times daily as needed for mouth pain. 100 mL 0  . metFORMIN (GLUCOPHAGE) 500 MG tablet Take 2 tablets (1,000 mg total) by mouth 2 (two) times daily with a meal. 120 tablet 11  . Multiple  Vitamins-Calcium (ONE-A-DAY WOMENS PO) Take 1 tablet by mouth daily.    . mupirocin cream (BACTROBAN) 2 % Apply 1 application topically 2 (two) times daily. 15 g 0  . nitrofurantoin, macrocrystal-monohydrate, (MACROBID) 100 MG capsule Take 1 capsule (100 mg total) by mouth 2 (two) times daily. 10 capsule 0  . Olmesartan-amLODIPine-HCTZ 40-10-12.5 MG TABS Take 1 tablet by mouth daily. 30 tablet 11  . ONETOUCH VERIO test strip TEST as directed 100 each 3  . phenazopyridine (PYRIDIUM) 200 MG tablet Take 1 tablet (200 mg total) by mouth 3 (three) times daily. 6 tablet 0  . repaglinide (PRANDIN) 2 MG tablet Take 1 tablet (2 mg total) by mouth 3 (three) times daily before meals. 90 tablet 11  . Tetrahydrozoline-Zn Sulfate (EYE DROPS ALLERGY RELIEF OP) Place 1 drop into both eyes daily as needed (allergies).    . TRULICITY 1.5 MG/0.5ML SOPN INJECT 1.5 MILLIGRAM SUBCUTANEOUSLY 1 WEEK 2 mL 0  . budesonide-formoterol (SYMBICORT) 160-4.5 MCG/ACT inhaler Inhale 2 puffs into the lungs 2 (two) times daily. 1 Inhaler 11  . chlorhexidine (PERIDEX) 0.12 % solution Use as directed 15 mLs in the mouth or throat 2 (two) times daily. 120 mL 0   Current Facility-Administered Medications on File Prior to Visit  Medication Dose Route Frequency Provider Last Rate Last Dose  . 0.9 %  sodium chloride infusion  500 mL Intravenous Once Lynann Bologna, MD        Allergies  Allergen Reactions  . Ciprofloxacin     REACTION: gittery  . Tramadol Other (See Comments)    Headaches worse    Family History  Problem Relation Age of Onset  . Hypertension Mother   . Esophageal cancer Mother 74  . Hypertension Maternal Grandmother   . Stroke Maternal Grandmother   . Ovarian cancer Sister   . Prostate cancer Maternal Grandfather     BP (!) 162/118 (BP Location: Left Arm, Patient Position: Sitting, Cuff Size: Normal)   Pulse 82   Ht 5\' 3"  (1.6 m)   Wt 228 lb (103.4 kg)   SpO2 98%   BMI 40.39 kg/m    Review of  Systems She denies hypoglycemia    Objective:   Physical Exam VITAL SIGNS:  See vs page GENERAL: no distress Pulses: dorsalis pedis intact bilat.   MSK: no deformity of the feet CV: no leg edema Skin:  no ulcer on the feet.  normal color  and temp on the feet. Neuro: sensation is intact to touch on the feet.     Lab Results  Component Value Date   HGBA1C 7.5 (H) 12/13/2018   Lab Results  Component Value Date   TSH 1.84 12/13/2018      Assessment & Plan:  Type 2 DM, with DN: she declines to add another medication, or to take insulin.   HTN: is noted today Goiter: due for recheck.   Patient Instructions  Your blood pressure is high today.  Please see your primary care provider soon, to have it rechecked.    Please do the follow-up thyroid ultrasound on Friday, as scheduled.   Please continue the same diabetes medications.   check your blood sugar once a day.  vary the time of day when you check, between before the 3 meals, and at bedtime.  also check if you have symptoms of your blood sugar being too high or too low.  please keep a record of the readings and bring it to your next appointment here (or you can bring the meter itself).  You can write it on any piece of paper.  please call us sooner if your blood sugar goes below 70, or if you have a lot of readings over 200.  Please come back for a follow-up appointment in 2-3 months.

## 2018-12-21 NOTE — Patient Instructions (Addendum)
Your blood pressure is high today.  Please see your primary care provider soon, to have it rechecked.    Please do the follow-up thyroid ultrasound on Friday, as scheduled.   Please continue the same diabetes medications.   check your blood sugar once a day.  vary the time of day when you check, between before the 3 meals, and at bedtime.  also check if you have symptoms of your blood sugar being too high or too low.  please keep a record of the readings and bring it to your next appointment here (or you can bring the meter itself).  You can write it on any piece of paper.  please call us sooner if your blood sugar goes below 70, or if you have a lot of readings over 200.  Please come back for a follow-up appointment in 2-3 months.

## 2018-12-23 ENCOUNTER — Ambulatory Visit
Admission: RE | Admit: 2018-12-23 | Discharge: 2018-12-23 | Disposition: A | Discharge status: Home / Self Care | Payer: No Typology Code available for payment source | Source: Ambulatory Visit | Attending: Internal Medicine | Admitting: Internal Medicine

## 2018-12-23 ENCOUNTER — Ambulatory Visit
Admission: RE | Admit: 2018-12-23 | Discharge: 2018-12-23 | Disposition: A | Discharge status: Home / Self Care | Payer: 59 | Source: Ambulatory Visit | Attending: Internal Medicine | Admitting: Internal Medicine

## 2018-12-23 DIAGNOSIS — I10 Essential (primary) hypertension: Secondary | ICD-10-CM

## 2018-12-23 DIAGNOSIS — E119 Type 2 diabetes mellitus without complications: Secondary | ICD-10-CM

## 2018-12-23 DIAGNOSIS — E785 Hyperlipidemia, unspecified: Secondary | ICD-10-CM

## 2018-12-23 DIAGNOSIS — E011 Iodine-deficiency related multinodular (endemic) goiter: Secondary | ICD-10-CM | POA: Diagnosis not present

## 2019-01-05 ENCOUNTER — Other Ambulatory Visit: Payer: Self-pay | Admitting: Endocrinology

## 2019-01-06 DIAGNOSIS — Z1231 Encounter for screening mammogram for malignant neoplasm of breast: Secondary | ICD-10-CM | POA: Diagnosis not present

## 2019-01-06 DIAGNOSIS — Z6839 Body mass index (BMI) 39.0-39.9, adult: Secondary | ICD-10-CM | POA: Diagnosis not present

## 2019-01-06 DIAGNOSIS — Z01419 Encounter for gynecological examination (general) (routine) without abnormal findings: Secondary | ICD-10-CM | POA: Diagnosis not present

## 2019-01-06 DIAGNOSIS — Z124 Encounter for screening for malignant neoplasm of cervix: Secondary | ICD-10-CM | POA: Diagnosis not present

## 2019-01-06 LAB — HM PAP SMEAR: HM Pap smear: NEGATIVE

## 2019-01-25 ENCOUNTER — Encounter: Payer: Self-pay | Admitting: Internal Medicine

## 2019-01-31 DIAGNOSIS — N879 Dysplasia of cervix uteri, unspecified: Secondary | ICD-10-CM | POA: Diagnosis not present

## 2019-02-02 ENCOUNTER — Other Ambulatory Visit: Payer: Self-pay | Admitting: Endocrinology

## 2019-02-09 ENCOUNTER — Encounter: Payer: Self-pay | Admitting: Internal Medicine

## 2019-02-09 NOTE — Progress Notes (Signed)
Outside notes received. Information abstracted. Notes sent to scan.  

## 2019-03-10 ENCOUNTER — Other Ambulatory Visit: Payer: Self-pay | Admitting: Endocrinology

## 2019-03-15 ENCOUNTER — Other Ambulatory Visit: Payer: Self-pay | Admitting: Internal Medicine

## 2019-03-15 MED ORDER — OLMESARTAN-AMLODIPINE-HCTZ 40-10-12.5 MG PO TABS: 1.0000 | ORAL_TABLET | Freq: Every day | ORAL | 3 refills | Status: DC

## 2019-03-15 NOTE — Telephone Encounter (Signed)
Previously filled by pt's old pcp, forwarding to new pcp office

## 2019-03-15 NOTE — Telephone Encounter (Signed)
Reviewed chart pt is up-to-date sent refills to pof.../lmb  

## 2019-03-31 ENCOUNTER — Ambulatory Visit: Payer: 59 | Admitting: Endocrinology

## 2019-04-06 ENCOUNTER — Telehealth: Payer: Self-pay | Admitting: Internal Medicine

## 2019-04-06 MED ORDER — BUDESONIDE-FORMOTEROL FUMARATE 160-4.5 MCG/ACT IN AERO: 2.0000 | INHALATION_SPRAY | Freq: Two times a day (BID) | RESPIRATORY_TRACT | 11 refills | Status: DC

## 2019-04-06 MED ORDER — DOXYCYCLINE HYCLATE 100 MG PO TABS: 100.0000 mg | ORAL_TABLET | Freq: Two times a day (BID) | ORAL | 0 refills | Status: DC

## 2019-04-06 NOTE — Telephone Encounter (Signed)
I am not sure what to make out of it.  I looked at the pictures.  There is mild discoloration.  I would probably just watch it at the moment.  Office visit if worse.  Thank you

## 2019-04-06 NOTE — Telephone Encounter (Signed)
Pt notified, Doxy sent per provider request

## 2019-04-06 NOTE — Addendum Note (Signed)
Addended by: Scarlett Presto on: 04/06/2019 03:57 PM   Modules accepted: Orders

## 2019-04-06 NOTE — Telephone Encounter (Signed)
Spoke to patient and she is at work and cannot do a virtual at this time she will send a picture of her foot through mychart,  Please advise on refill as well

## 2019-04-06 NOTE — Telephone Encounter (Signed)
Patient is calling because she has possible cellulitis on her foot. She would like a call back to discuss and also a refill on her symbicort

## 2019-04-06 NOTE — Telephone Encounter (Signed)
Please advise. Symbicort last filled in 2018

## 2019-04-12 ENCOUNTER — Ambulatory Visit: Payer: 59 | Admitting: Podiatry

## 2019-04-12 ENCOUNTER — Ambulatory Visit: Payer: 59

## 2019-04-12 ENCOUNTER — Other Ambulatory Visit: Payer: Self-pay

## 2019-04-12 ENCOUNTER — Encounter: Payer: Self-pay | Admitting: Podiatry

## 2019-04-12 ENCOUNTER — Ambulatory Visit: Payer: 59 | Admitting: Internal Medicine

## 2019-04-12 VITALS — BP 176/104 | HR 78 | Temp 98.1°F | Resp 16

## 2019-04-12 DIAGNOSIS — L292 Pruritus vulvae: Secondary | ICD-10-CM | POA: Insufficient documentation

## 2019-04-12 DIAGNOSIS — M7751 Other enthesopathy of right foot: Secondary | ICD-10-CM | POA: Diagnosis not present

## 2019-04-12 DIAGNOSIS — N760 Acute vaginitis: Secondary | ICD-10-CM

## 2019-04-12 DIAGNOSIS — L309 Dermatitis, unspecified: Secondary | ICD-10-CM

## 2019-04-12 DIAGNOSIS — M79671 Pain in right foot: Secondary | ICD-10-CM

## 2019-04-12 DIAGNOSIS — B351 Tinea unguium: Secondary | ICD-10-CM

## 2019-04-12 DIAGNOSIS — B9689 Other specified bacterial agents as the cause of diseases classified elsewhere: Secondary | ICD-10-CM | POA: Insufficient documentation

## 2019-04-12 DIAGNOSIS — M779 Enthesopathy, unspecified: Secondary | ICD-10-CM

## 2019-04-12 DIAGNOSIS — L84 Corns and callosities: Secondary | ICD-10-CM

## 2019-04-12 MED ORDER — TRIAMCINOLONE ACETONIDE 10 MG/ML IJ SUSP
10.0000 mg | Freq: Once | INTRAMUSCULAR | Status: AC
  Administered 2019-04-12: 10 mg

## 2019-04-12 NOTE — Progress Notes (Signed)
   Subjective:    Patient ID: [name] [name], female    DOB: 1971/09/23, 48 y.o.   MRN: [id number]  HPI    Review of Systems  All other systems reviewed and are negative.      Objective:   Physical Exam        Assessment & Plan:

## 2019-04-12 NOTE — Progress Notes (Signed)
Subjective:   Patient ID: [name] [name], female   DOB: 48 y.o.   MRN: [id number]   HPI Patient states she has a lot of pain on the inside of the right foot and does not remember an injury.  States it started all of a sudden and her family doctor did put her on an antibiotic but it does not seem to be helping and it is quite sore and feels like she is walking on it not.  Patient's A1c 7.5 and patient does not smoke and likes to be active   Review of Systems  All other systems reviewed and are negative.       Objective:  Physical Exam Vitals signs and nursing note reviewed.  Constitutional:      Appearance: She is well-developed.  Pulmonary:     Effort: Pulmonary effort is normal.  Musculoskeletal: Normal range of motion.  Skin:    General: Skin is warm.  Neurological:     Mental Status: She is alert.     Neurovascular status intact muscle strength is adequate range of motion is within normal limits with patient found to have mild nail discoloration dry skin formation and on the right arch medial side there is a keratotic lesion with inflammation fluid around the anterior tibial insertion.  It is sore when pressed and does have a nucleated core.  Patient has good digital perfusion well oriented x3     Assessment:  Inflammatory tendinitis with keratotic lesion formation right with patient having mycotic nail infection and mild localized dermatitis reactions of the heel bilateral     Plan:  H&P x-ray not taken and condition reviewed with patient.  Today went ahead and I injected the tendon complex 3 mg Dexasone Kenalog 5 mg Xylocaine after explaining risk of injection and then I did debridement of lesion and I applied medication to soften it and applied sterile dressing.  Gave instructions if symptoms were to occur we will see her back and may require other treatment or excision or other treatments for her other problems which I discussed today

## 2019-04-13 ENCOUNTER — Telehealth: Payer: Self-pay | Admitting: *Deleted

## 2019-04-13 ENCOUNTER — Encounter: Payer: Self-pay | Admitting: *Deleted

## 2019-04-13 NOTE — Telephone Encounter (Signed)
Pt states she had a small procedure with injection yesterday with Dr. Charlsie Merles and is having some throbbing and would like a note to be out of work today.

## 2019-04-13 NOTE — Telephone Encounter (Signed)
Left message informing pt I would email to [email or web address] to be out of work today.

## 2019-04-17 ENCOUNTER — Other Ambulatory Visit: Payer: Self-pay | Admitting: Endocrinology

## 2019-04-19 ENCOUNTER — Other Ambulatory Visit: Payer: Self-pay

## 2019-04-19 ENCOUNTER — Ambulatory Visit (INDEPENDENT_AMBULATORY_CARE_PROVIDER_SITE_OTHER): Payer: 59 | Admitting: Endocrinology

## 2019-04-19 DIAGNOSIS — E119 Type 2 diabetes mellitus without complications: Secondary | ICD-10-CM

## 2019-04-19 DIAGNOSIS — E042 Nontoxic multinodular goiter: Secondary | ICD-10-CM | POA: Diagnosis not present

## 2019-04-19 NOTE — Progress Notes (Signed)
Subjective:    Patient ID: [name] [name], female    DOB: 1971-07-20, 48 y.o.   MRN: [id number]  HPI  telehealth visit today via doxy video visit.  Alternatives to telehealth are presented to this patient, and the patient agrees to the telehealth visit. Pt is advised of the cost of the visit, and agrees to this, also.   Patient is at home, and I am at the office.   Persons attending the telehealth visit: the patient and I Pt returns for f/u of diabetes mellitus:  DM type: 2 Dx'ed: 2011 Complications: nephropathy.   Therapy: 3 oral meds, and and trulicity.   GDM: never DKA: never Severe hypoglycemia: never Pancreatitis: never Other: She declines weight-loss surgery; she has never taken insulin, but she has learned how.   Interval history:  pt states she feels well in general.  She takes meds as rx'ed.  She says cbg's are in the low-100's Pt also has small multinodular goiter (dx'ed 2015; US showed the nodules too small to merit bx; f/u US in 2020 showed no signif change, and no need for f/u; she has been euthyroid on no rx).  She denies neck swelling.   Past Medical History:  Diagnosis Date  . Anemia, iron deficiency   . Chronic headache    Dr Vela Prose, Neg MRI 2005  . Diabetes mellitus   . Elevated glucose 2011  . GERD (gastroesophageal reflux disease)   . HTN (hypertension)     Past Surgical History:  Procedure Laterality Date  . ABDOMINAL HYSTERECTOMY    . BREAST REDUCTION SURGERY    . Cyst removed from vagina    . INDUCED ABORTION  1990-1994   x 4  . NOSE SURGERY    . PARTIAL HYSTERECTOMY  2010  . TUBAL LIGATION      Social History   Socioeconomic History  . Marital status: Single    Spouse name: Not on file  . Number of children: 2  . Years of education: Not on file  . Highest education level: Not on file  Occupational History  . Occupation: office specialist  Social Needs  . Financial resource strain: Not on file  . Food insecurity:    Worry: Not on  file    Inability: Not on file  . Transportation needs:    Medical: Not on file    Non-medical: Not on file  Tobacco Use  . Smoking status: Never Smoker  . Smokeless tobacco: Never Used  Substance and Sexual Activity  . Alcohol use: Yes    Alcohol/week: 0.0 standard drinks    Comment: occasionally 2 per week  . Drug use: No  . Sexual activity: Not on file  Lifestyle  . Physical activity:    Days per week: Not on file    Minutes per session: Not on file  . Stress: Not on file  Relationships  . Social connections:    Talks on phone: Not on file    Gets together: Not on file    Attends religious service: Not on file    Active member of club or organization: Not on file    Attends meetings of clubs or organizations: Not on file    Relationship status: Not on file  . Intimate partner violence:    Fear of current or ex partner: Not on file    Emotionally abused: Not on file    Physically abused: Not on file    Forced sexual activity: Not on file  Other Topics Concern  . Not on file  Social History Narrative   Regular exercise - YES    Current Outpatient Medications on File Prior to Visit  Medication Sig Dispense Refill  . budesonide-formoterol (SYMBICORT) 160-4.5 MCG/ACT inhaler Inhale 2 puffs into the lungs 2 (two) times daily. 1 Inhaler 11  . cetirizine (ZYRTEC) 10 MG tablet Take 1 tablet (10 mg total) by mouth daily. 30 tablet 0  . chlorhexidine (PERIDEX) 0.12 % solution Use as directed 15 mLs in the mouth or throat 2 (two) times daily. 120 mL 0  . Cholecalciferol (VITAMIN D3) 1.25 MG (50000 UT) CAPS Take 1 capsule by mouth once a week. 8 capsule 0  . Cholecalciferol (VITAMIN D3) 50 MCG (2000 UT) capsule Take 1 capsule (2,000 Units total) by mouth daily. 100 capsule 3  . cyclobenzaprine (FLEXERIL) 5 MG tablet Take 1 tablet by mouth as needed.    . diclofenac (VOLTAREN) 75 MG EC tablet Take 1 tablet (75 mg total) by mouth 2 (two) times daily. 20 tablet 0  . Diclofenac  Sodium (PENNSAID) 2 % SOLN Place 2 application onto the skin 2 (two) times daily. 112 g 3  . doxycycline (VIBRA-TABS) 100 MG tablet Take 1 tablet (100 mg total) by mouth 2 (two) times daily. 14 tablet 0  . empagliflozin (JARDIANCE) 10 MG TABS tablet Take 10 mg by mouth daily. 30 tablet 11  . fluticasone (FLONASE) 50 MCG/ACT nasal spray Place 2 sprays into both nostrils daily. 16 g 0  . Lancets (FREESTYLE) lancets Use two times daily as instructed. Dx: 250.00 100 each 6  . magic mouthwash w/lidocaine SOLN Take 5 mLs by mouth 3 (three) times daily as needed for mouth pain. 100 mL 0  . metFORMIN (GLUCOPHAGE) 500 MG tablet Take 2 tablets (1,000 mg total) by mouth 2 (two) times daily with a meal. 120 tablet 11  . Multiple Vitamins-Calcium (ONE-A-DAY WOMENS PO) Take 1 tablet by mouth daily.    . mupirocin cream (BACTROBAN) 2 % Apply 1 application topically 2 (two) times daily. 15 g 0  . nitrofurantoin, macrocrystal-monohydrate, (MACROBID) 100 MG capsule Take 1 capsule (100 mg total) by mouth 2 (two) times daily. 10 capsule 0  . Olmesartan-amLODIPine-HCTZ 40-10-12.5 MG TABS Take 1 tablet by mouth daily. Follow-up appt due in June must see provider for refills 30 tablet 3  . ONETOUCH VERIO test strip TEST as directed 100 each 3  . phenazopyridine (PYRIDIUM) 200 MG tablet Take 1 tablet (200 mg total) by mouth 3 (three) times daily. 6 tablet 0  . repaglinide (PRANDIN) 2 MG tablet Take 1 tablet (2 mg total) by mouth 3 (three) times daily before meals. 90 tablet 11  . Tetrahydrozoline-Zn Sulfate (EYE DROPS ALLERGY RELIEF OP) Place 1 drop into both eyes daily as needed (allergies).    . TRULICITY 1.5 MG/0.5ML SOPN ADMINISTER 0.5 ML UNDER THE SKIN 1 TIME A WEEK 2 mL 0   Current Facility-Administered Medications on File Prior to Visit  Medication Dose Route Frequency Provider Last Rate Last Dose  . 0.9 %  sodium chloride infusion  500 mL Intravenous Once Lynann Bologna, MD        Allergies  Allergen Reactions   . Ciprofloxacin     REACTION: gittery  . Tramadol Other (See Comments)    Headaches worse    Family History  Problem Relation Age of Onset  . Hypertension Mother   . Esophageal cancer Mother 63  . Hypertension Maternal Grandmother   . Stroke  Maternal Grandmother   . Ovarian cancer Sister   . Prostate cancer Maternal Grandfather      Review of Systems She denies hypoglycemia.      Objective:   Physical Exam       Assessment & Plan:  Type 2 DM: apparently well-controlled.  She declines a1c now.   MNG: stable per pt  Patient Instructions  Please continue the same diabetes medications.   check your blood sugar once a day.  vary the time of day when you check, between before the 3 meals, and at bedtime.  also check if you have symptoms of your blood sugar being too high or too low.  please keep a record of the readings and bring it to your next appointment here (or you can bring the meter itself).  You can write it on any piece of paper.  please call us sooner if your blood sugar goes below 70, or if you have a lot of readings over 200.  Please come back for a follow-up appointment in 2-3 months.

## 2019-04-19 NOTE — Patient Instructions (Addendum)
Please continue the same diabetes medications.  check your blood sugar once a day.  vary the time of day when you check, between before the 3 meals, and at bedtime.  also check if you have symptoms of your blood sugar being too high or too low.  please keep a record of the readings and bring it to your next appointment here (or you can bring the meter itself).  You can write it on any piece of paper.  please call us sooner if your blood sugar goes below 70, or if you have a lot of readings over 200.   Please come back for a follow-up appointment in 2-3 months.  

## 2019-04-25 ENCOUNTER — Other Ambulatory Visit: Payer: Self-pay

## 2019-04-25 ENCOUNTER — Telehealth: Payer: Self-pay | Admitting: Endocrinology

## 2019-04-25 MED ORDER — DULAGLUTIDE 1.5 MG/0.5ML ~~LOC~~ SOAJ: SUBCUTANEOUS | 0 refills | Status: DC

## 2019-04-25 NOTE — Telephone Encounter (Signed)
MEDICATION: TRULICITY 1.5 MG/0.5ML SOPN  PHARMACY:  [facility]  IS THIS A 90 DAY SUPPLY :   IS PATIENT OUT OF MEDICATION:   IF NOT; HOW MUCH IS LEFT:   LAST APPOINTMENT DATE: @5 /05/2019  NEXT APPOINTMENT DATE:@7 /09/2019  DO WE HAVE YOUR PERMISSION TO LEAVE A DETAILED MESSAGE:  OTHER COMMENTS:    **Let patient know to contact pharmacy at the end of the day to make sure medication is ready. **  ** Please notify patient to allow 48-72 hours to process**  **Encourage patient to contact the pharmacy for refills or they can request refills through [facility]**

## 2019-04-25 NOTE — Telephone Encounter (Signed)
Dulaglutide (TRULICITY) 1.5 MG/0.5ML SOPN 2 mL 0 04/25/2019    Sig: ADMINISTER 0.5 ML UNDER THE SKIN 1 TIME A WEEK   Sent to pharmacy as: Dulaglutide (TRULICITY) 1.5 MG/0.5ML Solution Pen-injector   E-Prescribing Status: Receipt confirmed by pharmacy (04/25/2019 10:37 AM EDT)

## 2019-05-17 ENCOUNTER — Other Ambulatory Visit: Payer: Self-pay | Admitting: Endocrinology

## 2019-05-20 ENCOUNTER — Other Ambulatory Visit: Payer: Self-pay | Admitting: Endocrinology

## 2019-06-12 ENCOUNTER — Ambulatory Visit: Payer: 59 | Admitting: Podiatry

## 2019-06-12 ENCOUNTER — Encounter: Payer: Self-pay | Admitting: Podiatry

## 2019-06-12 ENCOUNTER — Other Ambulatory Visit: Payer: Self-pay

## 2019-06-12 VITALS — Temp 98.5°F

## 2019-06-12 DIAGNOSIS — B070 Plantar wart: Secondary | ICD-10-CM

## 2019-06-12 NOTE — Patient Instructions (Signed)

## 2019-06-13 ENCOUNTER — Encounter: Payer: Self-pay | Admitting: Internal Medicine

## 2019-06-13 ENCOUNTER — Ambulatory Visit: Payer: 59 | Admitting: Internal Medicine

## 2019-06-13 ENCOUNTER — Other Ambulatory Visit: Payer: Self-pay

## 2019-06-13 ENCOUNTER — Other Ambulatory Visit (INDEPENDENT_AMBULATORY_CARE_PROVIDER_SITE_OTHER): Payer: 59

## 2019-06-13 DIAGNOSIS — E785 Hyperlipidemia, unspecified: Secondary | ICD-10-CM | POA: Diagnosis not present

## 2019-06-13 DIAGNOSIS — I10 Essential (primary) hypertension: Secondary | ICD-10-CM | POA: Diagnosis not present

## 2019-06-13 DIAGNOSIS — E559 Vitamin D deficiency, unspecified: Secondary | ICD-10-CM | POA: Diagnosis not present

## 2019-06-13 DIAGNOSIS — E119 Type 2 diabetes mellitus without complications: Secondary | ICD-10-CM

## 2019-06-13 LAB — BASIC METABOLIC PANEL
BUN: 13 mg/dL (ref 6–23)
CO2: 29 mEq/L (ref 19–32)
Calcium: 9 mg/dL (ref 8.4–10.5)
Chloride: 102 mEq/L (ref 96–112)
Creatinine, Ser: 1.07 mg/dL (ref 0.40–1.20)
GFR: 66.09 mL/min (ref >60.00)
Glucose, Bld: 124 mg/dL — ABNORMAL HIGH (ref 70–99)
Potassium: 3.6 mEq/L (ref 3.5–5.1)
Sodium: 139 mEq/L (ref 135–145)

## 2019-06-13 LAB — HEMOGLOBIN A1C: Hgb A1c MFr Bld: 7.1 % — ABNORMAL HIGH (ref 4.6–6.5)

## 2019-06-13 NOTE — Assessment & Plan Note (Signed)
Labs

## 2019-06-13 NOTE — Assessment & Plan Note (Signed)
Vit D 

## 2019-06-13 NOTE — Assessment & Plan Note (Addendum)
Lost wt on apple cider vinegar gummies  Trulicity. Metformin, Prandin  Lost wt on apple cider vinegar gummies

## 2019-06-13 NOTE — Assessment & Plan Note (Signed)
BP nl at home per pt [place]-Amlodipine-HCT

## 2019-06-13 NOTE — Progress Notes (Signed)
Subjective:   Patient ID: [name] [name], female   DOB: 48 y.o.   MRN: [id number]   HPI Patient presents stating this lesion on my right foot has still remained tender and the medication helped for period of time debridement but it is painful and I like something more definitive if possible   ROS      Objective:  Physical Exam  Neurovascular status intact negative Homans sign noted with patient's right lesion found to be painful and is difficult for her to wear shoe gear with     Assessment:  Possibility for verruca or other unknown lesion right     Plan:  Since it did not respond conservatively I did do a sterile prep injected 60 mg like Marcaine mixture proximal to the area and then using sterile instrumentation I excised the mass entirely placed a small amount of phenol on the base applied sterile dressing and sent for pathological evaluation.  Reappoint for [place] to recheck and encouraged her to call us with any questions concerns and she does understand that even with this procedure it may recur

## 2019-06-13 NOTE — Progress Notes (Signed)
Subjective:  Patient ID: [name] [name], female    DOB: 07/11/1971  Age: 48 y.o. MRN: [id number]  CC: No chief complaint on file.   HPI [name] [name] presents for HTN, obesity, DM. Lost wt on apple cider vinegar gummies  Outpatient Medications Prior to Visit  Medication Sig Dispense Refill  . budesonide-formoterol (SYMBICORT) 160-4.5 MCG/ACT inhaler Inhale 2 puffs into the lungs 2 (two) times daily. 1 Inhaler 11  . cetirizine (ZYRTEC) 10 MG tablet Take 1 tablet (10 mg total) by mouth daily. 30 tablet 0  . chlorhexidine (PERIDEX) 0.12 % solution Use as directed 15 mLs in the mouth or throat 2 (two) times daily. 120 mL 0  . Cholecalciferol (VITAMIN D3) 1.25 MG (50000 UT) CAPS Take 1 capsule by mouth once a week. 8 capsule 0  . Cholecalciferol (VITAMIN D3) 50 MCG (2000 UT) capsule Take 1 capsule (2,000 Units total) by mouth daily. 100 capsule 3  . cyclobenzaprine (FLEXERIL) 5 MG tablet Take 1 tablet by mouth as needed.    . diclofenac (VOLTAREN) 75 MG EC tablet Take 1 tablet (75 mg total) by mouth 2 (two) times daily. 20 tablet 0  . Diclofenac Sodium (PENNSAID) 2 % SOLN Place 2 application onto the skin 2 (two) times daily. 112 g 3  . doxycycline (VIBRA-TABS) 100 MG tablet Take 1 tablet (100 mg total) by mouth 2 (two) times daily. 14 tablet 0  . empagliflozin (JARDIANCE) 10 MG TABS tablet Take 10 mg by mouth daily. 30 tablet 11  . fluticasone (FLONASE) 50 MCG/ACT nasal spray Place 2 sprays into both nostrils daily. 16 g 0  . Lancets (FREESTYLE) lancets Use two times daily as instructed. Dx: 250.00 100 each 6  . magic mouthwash w/lidocaine SOLN Take 5 mLs by mouth 3 (three) times daily as needed for mouth pain. 100 mL 0  . metFORMIN (GLUCOPHAGE) 500 MG tablet Take 2 tablets (1,000 mg total) by mouth 2 (two) times daily with a meal. 120 tablet 11  . Multiple Vitamins-Calcium (ONE-A-DAY WOMENS PO) Take 1 tablet by mouth daily.    . mupirocin cream (BACTROBAN) 2 % Apply 1 application  topically 2 (two) times daily. 15 g 0  . nitrofurantoin, macrocrystal-monohydrate, (MACROBID) 100 MG capsule Take 1 capsule (100 mg total) by mouth 2 (two) times daily. 10 capsule 0  . Olmesartan-amLODIPine-HCTZ 40-10-12.5 MG TABS Take 1 tablet by mouth daily. Follow-up appt due in June must see provider for refills 30 tablet 3  . ONETOUCH VERIO test strip AS DIRECTED 100 each 2  . phenazopyridine (PYRIDIUM) 200 MG tablet Take 1 tablet (200 mg total) by mouth 3 (three) times daily. 6 tablet 0  . repaglinide (PRANDIN) 2 MG tablet Take 1 tablet (2 mg total) by mouth 3 (three) times daily before meals. 90 tablet 11  . Tetrahydrozoline-Zn Sulfate (EYE DROPS ALLERGY RELIEF OP) Place 1 drop into both eyes daily as needed (allergies).    . TRULICITY 1.5 MG/0.5ML SOPN ADMINISTER 0.5 ML UNDER THE SKIN 1 TIME A WEEK 2 mL 0   Facility-Administered Medications Prior to Visit  Medication Dose Route Frequency Provider Last Rate Last Dose  . 0.9 %  sodium chloride infusion  500 mL Intravenous Once Lynann BolognaGupta, Rajesh, MD        ROS: Review of Systems  Constitutional: Negative for activity change, appetite change, chills, fatigue and unexpected weight change.  HENT: Negative for congestion, mouth sores and sinus pressure.   Eyes: Negative for visual disturbance.  Respiratory: Negative  for cough and chest tightness.   Gastrointestinal: Negative for abdominal pain and nausea.  Genitourinary: Negative for difficulty urinating, frequency and vaginal pain.  Musculoskeletal: Negative for back pain and gait problem.  Skin: Negative for pallor and rash.  Neurological: Negative for dizziness, tremors, weakness, numbness and headaches.  Psychiatric/Behavioral: Negative for confusion, sleep disturbance and suicidal ideas.    Objective:  BP (!) 146/88 (BP Location: Left Arm, Patient Position: Sitting, Cuff Size: Large)   Pulse 92   Temp 97.6 F (36.4 C) (Oral)   Ht 5\' 3"  (1.6 m)   Wt 219 lb (99.3 kg)   SpO2 99%    BMI 38.79 kg/m   BP Readings from Last 3 Encounters:  06/13/19 (!) 146/88  04/12/19 (!) 176/104  12/21/18 (!) 162/118    Wt Readings from Last 3 Encounters:  06/13/19 219 lb (99.3 kg)  12/21/18 228 lb (103.4 kg)  12/13/18 226 lb (102.5 kg)    Physical Exam Constitutional:      General: She is not in acute distress.    Appearance: She is well-developed.  HENT:     Head: Normocephalic.     Right Ear: External ear normal.     Left Ear: External ear normal.     Nose: Nose normal.  Eyes:     General:        Right eye: No discharge.        Left eye: No discharge.     Conjunctiva/sclera: Conjunctivae normal.     Pupils: Pupils are equal, round, and reactive to light.  Neck:     Musculoskeletal: Normal range of motion and neck supple.     Thyroid: No thyromegaly.     Vascular: No JVD.     Trachea: No tracheal deviation.  Cardiovascular:     Rate and Rhythm: Normal rate and regular rhythm.     Heart sounds: Normal heart sounds.  Pulmonary:     Effort: No respiratory distress.     Breath sounds: No stridor. No wheezing.  Abdominal:     General: Bowel sounds are normal. There is no distension.     Palpations: Abdomen is soft. There is no mass.     Tenderness: There is no abdominal tenderness. There is no guarding or rebound.  Musculoskeletal:        General: No tenderness.  Lymphadenopathy:     Cervical: No cervical adenopathy.  Skin:    Findings: No erythema or rash.  Neurological:     Cranial Nerves: No cranial nerve deficit.     Motor: No abnormal muscle tone.     Coordination: Coordination normal.     Deep Tendon Reflexes: Reflexes normal.  Psychiatric:        Behavior: Behavior normal.        Thought Content: Thought content normal.        Judgment: Judgment normal.     Lab Results  Component Value Date   WBC 9.8 12/13/2018   HGB 14.7 12/13/2018   HCT 44.4 12/13/2018   PLT 325.0 12/13/2018   GLUCOSE 137 (H) 12/13/2018   CHOL 219 (H) 12/13/2018   TRIG  90.0 12/13/2018   HDL 52.50 12/13/2018   LDLDIRECT 137.4 11/04/2010   LDLCALC 149 (H) 12/13/2018   ALT 17 12/13/2018   AST 14 12/13/2018   NA 136 12/13/2018   K 4.1 12/13/2018   CL 100 12/13/2018   CREATININE 1.12 12/13/2018   BUN 15 12/13/2018   CO2 28 12/13/2018   TSH  1.84 12/13/2018   HGBA1C 7.5 (H) 12/13/2018   MICROALBUR 9.0 (H) 12/13/2018    Ct Cardiac Scoring  Result Date: 12/23/2018 CLINICAL DATA:  48 year old black female with high cholesterol. Hypertension. EXAM: CT HEART FOR CALCIUM SCORING TECHNIQUE: CT heart was performed using prospective ECG gating. A non-contrast exam for calcium scoring was performed. Note that this exam targets the heart and the chest was not imaged in its entirety. COMPARISON:  Chest radiograph 01/30/2015 FINDINGS: Technical quality: Good. CORONARY CALCIUM Total Agatston Score: 0 MESA database percentile:  0 OTHER FINDINGS: Cardiovascular: No coronary artery calcium. Atherosclerotic calcifications at the aortic arch. Caliber of the visualized thoracic aorta is normal. Heart size is normal without significant pericardial fluid. Normal caliber of the main pulmonary arteries. Mediastinum/Nodes: Small amount of tissue in the anterior mediastinum is compatible with residual thymus. No enlarged lymph nodes. Visualized esophagus is unremarkable. Lungs/Pleura: Visualized lungs are clear.  No pleural effusions. Upper Abdomen: Negative Musculoskeletal: Negative IMPRESSION: 1. No coronary artery calcium.  Coronary calcium score is 0. 2.  Aortic Atherosclerosis (ICD10-I70.0). Electronically Signed   By: Richarda OverlieAdam  Henn M.D.   On: 12/23/2018 14:08   [place] Thyroid  Result Date: 12/23/2018 CLINICAL DATA:  Goiter EXAM: THYROID ULTRASOUND TECHNIQUE: Ultrasound examination of the thyroid gland and adjacent soft tissues was performed. COMPARISON:  03/05/2015, 03/09/2014 FINDINGS: Parenchymal Echotexture: Moderately heterogenous Isthmus: 0.5 cm thickness, stable Right lobe: 5.3 x 1.3 x  2.4 cm, previously 5.3 x 1.6 x 2.1 Left lobe: 5.3 x 1.6 x 2.2 cm, previously 5.5 x 1.6 x 2.3 _________________________________________________________ Estimated total number of nodules >/= 1 cm: 3 Number of spongiform nodules >/=  2 cm not described below (TR1): 0 Number of mixed cystic and solid nodules >/= 1.5 cm not described below (TR2): 0 _________________________________________________________ 0.9 cm benign colloid cyst, mid right Adjacent 1.2 x 1 x 0.7 cm benign colloid cyst, medial right 0.7 cm benign colloid cyst, superior left 0.7 cm adjacent benign colloid cyst, superomedial left 1.1 x 1.1 x 0.6 cm benign colloid cyst, mid left 1.2 x 0.8 x 0.5 cm benign colloid cyst, inferior left There are additional scattered smaller cystic and hypoechoic lesions bilaterally, all less than 0.5 cm IMPRESSION: 1. 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 None meets criteria for biopsy or dedicated imaging follow-up. The above is in keeping with the ACR TI-RADS recommendations - J Am Coll Radiol 2017;14:587-595. Electronically Signed   By: Corlis Leak  Hassell M.D.   On: 12/23/2018 15:05    Assessment & Plan:   There are no diagnoses linked to this encounter.   No orders of the defined types were placed in this encounter.    Follow-up: No follow-ups on file.  Sonda PrimesAlex Plotnikov, MD

## 2019-06-14 ENCOUNTER — Other Ambulatory Visit: Payer: Self-pay | Admitting: Internal Medicine

## 2019-06-14 MED ORDER — TIZANIDINE HCL 4 MG PO TABS: 4.0000 mg | ORAL_TABLET | Freq: Three times a day (TID) | ORAL | 0 refills | Status: DC | PRN

## 2019-06-14 MED ORDER — HYDROCODONE-ACETAMINOPHEN 5-325 MG PO TABS: 1.0000 | ORAL_TABLET | Freq: Three times a day (TID) | ORAL | 0 refills | Status: DC | PRN

## 2019-06-17 ENCOUNTER — Ambulatory Visit (HOSPITAL_COMMUNITY)
Admission: EM | Admit: 2019-06-17 | Discharge: 2019-06-17 | Disposition: A | Discharge status: Home / Self Care | Payer: 59 | Attending: Family Medicine | Admitting: Family Medicine

## 2019-06-17 ENCOUNTER — Encounter (HOSPITAL_COMMUNITY): Payer: Self-pay

## 2019-06-17 ENCOUNTER — Other Ambulatory Visit: Payer: Self-pay

## 2019-06-17 DIAGNOSIS — Z23 Encounter for immunization: Secondary | ICD-10-CM

## 2019-06-17 DIAGNOSIS — L03116 Cellulitis of left lower limb: Secondary | ICD-10-CM

## 2019-06-17 MED ORDER — TETANUS-DIPHTH-ACELL PERTUSSIS 5-2.5-18.5 LF-MCG/0.5 IM SUSP
INTRAMUSCULAR | Status: AC
  Filled 2019-06-17: qty 0.5

## 2019-06-17 MED ORDER — FLUCONAZOLE 200 MG PO TABS: 200.0000 mg | ORAL_TABLET | Freq: Once | ORAL | 0 refills | Status: AC

## 2019-06-17 MED ORDER — TETANUS-DIPHTH-ACELL PERTUSSIS 5-2.5-18.5 LF-MCG/0.5 IM SUSP
0.5000 mL | Freq: Once | INTRAMUSCULAR | Status: AC
  Administered 2019-06-17: 13:00:00 0.5 mL via INTRAMUSCULAR

## 2019-06-17 MED ORDER — DOXYCYCLINE HYCLATE 100 MG PO CAPS: 100.0000 mg | ORAL_CAPSULE | Freq: Two times a day (BID) | ORAL | 0 refills | Status: AC

## 2019-06-17 NOTE — Discharge Instructions (Addendum)
Take antibiotic as directed. Use Diflucan at the end of your course if you develop vaginal irritation, itching. Return if redness spreads, goes up or down your leg, you develop significant pain, particularly with bearing weight or walking.  You develop fevers, aches.
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
## 2019-06-20 ENCOUNTER — Ambulatory Visit (INDEPENDENT_AMBULATORY_CARE_PROVIDER_SITE_OTHER): Payer: 59 | Admitting: Internal Medicine

## 2019-06-20 ENCOUNTER — Encounter: Payer: Self-pay | Admitting: Internal Medicine

## 2019-06-20 ENCOUNTER — Other Ambulatory Visit: Payer: Self-pay

## 2019-06-20 DIAGNOSIS — S81802D Unspecified open wound, left lower leg, subsequent encounter: Secondary | ICD-10-CM

## 2019-06-20 DIAGNOSIS — S81802A Unspecified open wound, left lower leg, initial encounter: Secondary | ICD-10-CM | POA: Insufficient documentation
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
## 2019-06-20 NOTE — Assessment & Plan Note (Signed)
Wound care discussed Finish abx

## 2019-06-21 ENCOUNTER — Other Ambulatory Visit: Payer: Self-pay

## 2019-06-23 ENCOUNTER — Ambulatory Visit: Payer: 59 | Admitting: Endocrinology

## 2019-06-23 ENCOUNTER — Encounter: Payer: Self-pay | Admitting: Endocrinology

## 2019-06-23 ENCOUNTER — Other Ambulatory Visit: Payer: Self-pay

## 2019-06-23 VITALS — BP 124/72 | HR 94 | Ht 63.0 in | Wt 222.6 lb

## 2019-06-23 DIAGNOSIS — E119 Type 2 diabetes mellitus without complications: Secondary | ICD-10-CM | POA: Diagnosis not present

## 2019-06-23 MED ORDER — JARDIANCE 25 MG PO TABS: 25.0000 mg | ORAL_TABLET | Freq: Every day | ORAL | 3 refills | Status: DC

## 2019-06-23 NOTE — Progress Notes (Signed)
Subjective:    Patient ID: [name] [name], female    DOB: 07-21-1971, 48 y.o.   MRN: [id number]  HPI Pt returns for f/u of diabetes mellitus:  DM type: 2 Dx'ed: 1761 Complications: nephropathy.   Therapy: 3 oral meds, and and trulicity.   GDM: never DKA: never Severe hypoglycemia: never Pancreatitis: never Other: She declines weight-loss surgery; she has never taken insulin, but she has learned how.    Interval history:  pt states she feels well in general.  She takes meds as rx'ed.  She says cbg's are in the low-100's.   Pt also has small multinodular goiter (dx'ed 2015; US showed the nodules too small to merit bx; f/u US in 2020 showed no signif change, and no need for f/u; she has been euthyroid on no rx).  She denies neck swelling.  Past Medical History:  Diagnosis Date  . Anemia, iron deficiency   . Chronic headache    Dr Melton Alar, Neg MRI 2005  . Diabetes mellitus   . Elevated glucose 2011  . GERD (gastroesophageal reflux disease)   . HTN (hypertension)     Past Surgical History:  Procedure Laterality Date  . ABDOMINAL HYSTERECTOMY    . BREAST REDUCTION SURGERY    . Cyst removed from vagina    . INDUCED ABORTION  1990-1994   x 4  . NOSE SURGERY    . PARTIAL HYSTERECTOMY  2010  . TUBAL LIGATION      Social History   Socioeconomic History  . Marital status: Single    Spouse name: Not on file  . Number of children: 2  . Years of education: Not on file  . Highest education level: Not on file  Occupational History  . Occupation: office specialist  Social Needs  . Financial resource strain: Not on file  . Food insecurity    Worry: Not on file    Inability: Not on file  . Transportation needs    Medical: Not on file    Non-medical: Not on file  Tobacco Use  . Smoking status: Never Smoker  . Smokeless tobacco: Never Used  Substance and Sexual Activity  . Alcohol use: Yes    Alcohol/week: 0.0 standard drinks    Comment: occasionally 2 per week  . Drug  use: No  . Sexual activity: Not on file  Lifestyle  . Physical activity    Days per week: Not on file    Minutes per session: Not on file  . Stress: Not on file  Relationships  . Social Herbalist on phone: Not on file    Gets together: Not on file    Attends religious service: Not on file    Active member of club or organization: Not on file    Attends meetings of clubs or organizations: Not on file    Relationship status: Not on file  . Intimate partner violence    Fear of current or ex partner: Not on file    Emotionally abused: Not on file    Physically abused: Not on file    Forced sexual activity: Not on file  Other Topics Concern  . Not on file  Social History Narrative   Regular exercise - YES    Current Outpatient Medications on File Prior to Visit  Medication Sig Dispense Refill  . budesonide-formoterol (SYMBICORT) 160-4.5 MCG/ACT inhaler Inhale 2 puffs into the lungs 2 (two) times daily. 1 Inhaler 11  . cetirizine (ZYRTEC) 10 MG  tablet Take 1 tablet (10 mg total) by mouth daily. 30 tablet 0  . Cholecalciferol (VITAMIN D3) 50 MCG (2000 UT) capsule Take 1 capsule (2,000 Units total) by mouth daily. 100 capsule 3  . cyclobenzaprine (FLEXERIL) 5 MG tablet Take 1 tablet by mouth as needed.    . diclofenac (VOLTAREN) 75 MG EC tablet Take 1 tablet (75 mg total) by mouth 2 (two) times daily. 20 tablet 0  . doxycycline (VIBRAMYCIN) 100 MG capsule Take 1 capsule (100 mg total) by mouth 2 (two) times daily for 7 days. 14 capsule 0  . fluticasone (FLONASE) 50 MCG/ACT nasal spray Place 2 sprays into both nostrils daily. 16 g 0  . HYDROcodone-acetaminophen (NORCO/VICODIN) 5-325 MG tablet Take 1 tablet by mouth every 8 (eight) hours as needed for severe pain. 15 tablet 0  . metFORMIN (GLUCOPHAGE) 500 MG tablet Take 2 tablets (1,000 mg total) by mouth 2 (two) times daily with a meal. 120 tablet 11  . Multiple Vitamins-Calcium (ONE-A-DAY WOMENS PO) Take 1 tablet by mouth  daily.    . Olmesartan-amLODIPine-HCTZ 40-10-12.5 MG TABS Take 1 tablet by mouth daily. Follow-up appt due in June must see provider for refills 30 tablet 3  . ONETOUCH VERIO test strip AS DIRECTED 100 each 2  . repaglinide (PRANDIN) 2 MG tablet Take 1 tablet (2 mg total) by mouth 3 (three) times daily before meals. 90 tablet 11  . Tetrahydrozoline-Zn Sulfate (EYE DROPS ALLERGY RELIEF OP) Place 1 drop into both eyes daily as needed (allergies).    Marland Kitchen. tiZANidine (ZANAFLEX) 4 MG tablet Take 1 tablet (4 mg total) by mouth every 8 (eight) hours as needed for muscle spasms. 30 tablet 0  . TRULICITY 1.5 MG/0.5ML SOPN ADMINISTER 0.5 ML UNDER THE SKIN 1 TIME A WEEK 2 mL 0   Current Facility-Administered Medications on File Prior to Visit  Medication Dose Route Frequency Provider Last Rate Last Dose  . 0.9 %  sodium chloride infusion  500 mL Intravenous Once Lynann BolognaGupta, Rajesh, MD        Allergies  Allergen Reactions  . Ciprofloxacin     REACTION: gittery  . Tramadol Other (See Comments)    Headaches worse    Family History  Problem Relation Age of Onset  . Hypertension Mother   . Esophageal cancer Mother [phone]  . Healthy Father   . Hypertension Maternal Grandmother   . Stroke Maternal Grandmother   . Ovarian cancer Sister   . Prostate cancer Maternal Grandfather     BP 124/72 (BP Location: Left Arm, Patient Position: Sitting, Cuff Size: Large)   Pulse 94   Ht 5\' 3"  (1.6 m)   Wt 222 lb 9.6 oz (101 kg)   SpO2 97%   BMI 39.43 kg/m    Review of Systems She denies hypoglycemia.      Objective:   Physical Exam VITAL SIGNS:  See vs page GENERAL: no distress Pulses: dorsalis pedis intact bilat.   MSK: no deformity of the feet CV: no leg edema Skin:  no ulcer on the feet.  normal color and temp on the feet. Neuro: sensation is intact to touch on the feet.    Lab Results  Component Value Date   HGBA1C 7.1 (H) 06/13/2019    Lab Results  Component Value Date   CHOL 219 (H) 12/13/2018    HDL 52.50 12/13/2018   LDLCALC 149 (H) 12/13/2018   LDLDIRECT 137.4 11/04/2010   TRIG 90.0 12/13/2018   CHOLHDL 4 12/13/2018  Lab Results  Component Value Date   CREATININE 1.07 06/13/2019   BUN 13 06/13/2019   NA 139 06/13/2019   K 3.6 06/13/2019   CL 102 06/13/2019   CO2 29 06/13/2019       Assessment & Plan:  Type 2 DM, with DN: she needs increased rx.    Patient Instructions  I have sent a prescription to your pharmacy, to increase the Jardiance Please continue the same other diabetes medications.   check your blood sugar once a day.  vary the time of day when you check, between before the 3 meals, and at bedtime.  also check if you have symptoms of your blood sugar being too high or too low.  please keep a record of the readings and bring it to your next appointment here (or you can bring the meter itself).  You can write it on any piece of paper.  please call us sooner if your blood sugar goes below 70, or if you have a lot of readings over 200.  Please come back for a follow-up appointment in 3 months.

## 2019-06-23 NOTE — Patient Instructions (Addendum)
I have sent a prescription to your pharmacy, to increase the Jardiance Please continue the same other diabetes medications.   check your blood sugar once a day.  vary the time of day when you check, between before the 3 meals, and at bedtime.  also check if you have symptoms of your blood sugar being too high or too low.  please keep a record of the readings and bring it to your next appointment here (or you can bring the meter itself).  You can write it on any piece of paper.  please call us sooner if your blood sugar goes below 70, or if you have a lot of readings over 200.  Please come back for a follow-up appointment in 3 months.

## 2019-08-15 ENCOUNTER — Other Ambulatory Visit: Payer: Self-pay | Admitting: Internal Medicine

## 2019-08-15 ENCOUNTER — Other Ambulatory Visit: Payer: Self-pay | Admitting: Endocrinology

## 2019-08-30 ENCOUNTER — Encounter: Payer: Self-pay | Admitting: Family Medicine

## 2019-08-30 ENCOUNTER — Other Ambulatory Visit: Payer: Self-pay

## 2019-08-30 ENCOUNTER — Ambulatory Visit: Payer: Self-pay

## 2019-08-30 ENCOUNTER — Ambulatory Visit: Payer: 59 | Admitting: Family Medicine

## 2019-08-30 VITALS — BP 140/90 | HR 89 | Ht 63.0 in

## 2019-08-30 DIAGNOSIS — M7061 Trochanteric bursitis, right hip: Secondary | ICD-10-CM | POA: Diagnosis not present

## 2019-08-30 DIAGNOSIS — M25551 Pain in right hip: Secondary | ICD-10-CM

## 2019-08-30 MED ORDER — VITAMIN D (ERGOCALCIFEROL) 1.25 MG (50000 UNIT) PO CAPS: 50000.0000 [IU] | ORAL_CAPSULE | ORAL | 0 refills | Status: DC

## 2019-08-30 MED ORDER — PENNSAID 2 % TD SOLN: 2.0000 g | Freq: Two times a day (BID) | TRANSDERMAL | 3 refills | Status: DC

## 2019-08-30 NOTE — Progress Notes (Signed)
[facility] [place], [place] [address] Phone: [phone] Subjective:   I Kandace Blitz am serving as a Education administrator for Dr. Hulan Saas.  I'm seeing this patient by the request  of:    CC:   POE:[id number]  LAQUESHA HOLCOMB is a 48 y.o. female coming in with complaint of right hip pain. States she forgot to put her car in park when she got out and injured herself.   Onset- June  Location - glut, hamstring Duration-  Character- throbbing, sharp  Aggravating factors- sitting Reliving factors-  Therapies tried- Ice, heat, topical, oral medications  Severity-  5/10 at its worse      Past Medical History:  Diagnosis Date  . Anemia, iron deficiency   . Chronic headache    Dr Melton Alar, Neg MRI 2005  . Diabetes mellitus   . Elevated glucose 2011  . GERD (gastroesophageal reflux disease)   . HTN (hypertension)    Past Surgical History:  Procedure Laterality Date  . ABDOMINAL HYSTERECTOMY    . BREAST REDUCTION SURGERY    . Cyst removed from vagina    . INDUCED ABORTION  1990-1994   x 4  . NOSE SURGERY    . PARTIAL HYSTERECTOMY  2010  . TUBAL LIGATION     Social History   Socioeconomic History  . Marital status: Single    Spouse name: Not on file  . Number of children: 2  . Years of education: Not on file  . Highest education level: Not on file  Occupational History  . Occupation: office specialist  Social Needs  . Financial resource strain: Not on file  . Food insecurity    Worry: Not on file    Inability: Not on file  . Transportation needs    Medical: Not on file    Non-medical: Not on file  Tobacco Use  . Smoking status: Never Smoker  . Smokeless tobacco: Never Used  Substance and Sexual Activity  . Alcohol use: Yes    Alcohol/week: 0.0 standard drinks    Comment: occasionally 2 per week  . Drug use: No  . Sexual activity: Not on file  Lifestyle  . Physical activity    Days per week: Not on file    Minutes per  session: Not on file  . Stress: Not on file  Relationships  . Social Herbalist on phone: Not on file    Gets together: Not on file    Attends religious service: Not on file    Active member of club or organization: Not on file    Attends meetings of clubs or organizations: Not on file    Relationship status: Not on file  Other Topics Concern  . Not on file  Social History Narrative   Regular exercise - YES   Allergies  Allergen Reactions  . Ciprofloxacin     REACTION: gittery  . Tramadol Other (See Comments)    Headaches worse   Family History  Problem Relation Age of Onset  . Hypertension Mother   . Esophageal cancer Mother 75  . Healthy Father   . Hypertension Maternal Grandmother   . Stroke Maternal Grandmother   . Ovarian cancer Sister   . Prostate cancer Maternal Grandfather     Current Outpatient Medications (Endocrine & Metabolic):  .  empagliflozin (JARDIANCE) 25 MG TABS tablet, Take 25 mg by mouth daily. .  metFORMIN (GLUCOPHAGE) 500 MG tablet, Take 2 tablets (  1,000 mg total) by mouth 2 (two) times daily with a meal. .  repaglinide (PRANDIN) 2 MG tablet, Take 1 tablet (2 mg total) by mouth 3 (three) times daily before meals. .  TRULICITY 1.5 MG/0.5ML SOPN, ADMINISTER 0.5 ML UNDER THE SKIN 1 TIME A WEEK   Current Outpatient Medications (Cardiovascular):  Marland Kitchen  Olmesartan-amLODIPine-HCTZ 40-10-12.5 MG TABS, Take 1 tablet by mouth daily. Follow-up appt due in June must see provider for refills   Current Outpatient Medications (Respiratory):  .  budesonide-formoterol (SYMBICORT) 160-4.5 MCG/ACT inhaler, Inhale 2 puffs into the lungs 2 (two) times daily. .  cetirizine (ZYRTEC) 10 MG tablet, Take 1 tablet (10 mg total) by mouth daily. .  fluticasone (FLONASE) 50 MCG/ACT nasal spray, Place 2 sprays into both nostrils daily.   Current Outpatient Medications (Analgesics):  .  diclofenac (VOLTAREN) 75 MG EC tablet, Take 1 tablet (75 mg total) by mouth 2 (two)  times daily. Marland Kitchen  HYDROcodone-acetaminophen (NORCO/VICODIN) 5-325 MG tablet, Take 1 tablet by mouth every 8 (eight) hours as needed for severe pain.     Current Outpatient Medications (Other):  Marland Kitchen  Cholecalciferol (VITAMIN D3) 50 MCG (2000 UT) capsule, Take 1 capsule (2,000 Units total) by mouth daily. .  cyclobenzaprine (FLEXERIL) 5 MG tablet, Take 1 tablet by mouth as needed. .  Multiple Vitamins-Calcium (ONE-A-DAY WOMENS PO), Take 1 tablet by mouth daily. Letta Pate VERIO test strip, AS DIRECTED .  Tetrahydrozoline-Zn Sulfate (EYE DROPS ALLERGY RELIEF OP), Place 1 drop into both eyes daily as needed (allergies). Marland Kitchen  tiZANidine (ZANAFLEX) 4 MG tablet, TAKE 1 TABLET(4 MG) BY MOUTH EVERY 8 HOURS AS NEEDED FOR MUSCLE SPASMS .  Vitamin D, Ergocalciferol, (DRISDOL) 1.25 MG (50000 UT) CAPS capsule, Take 1 capsule (50,000 Units total) by mouth every 7 (seven) days.  Current Facility-Administered Medications (Other):  .  0.9 %  sodium chloride infusion    Past medical history, social, surgical and family history all reviewed in electronic medical record.  No pertanent information unless stated regarding to the chief complaint.   Review of Systems:  No headache, visual changes, nausea, vomiting, diarrhea, constipation, dizziness, abdominal pain, skin rash, fevers, chills, night sweats, weight loss, swollen lymph nodes, body aches, joint swelling, muscle aches, chest pain, shortness of breath, mood changes.   Objective  Blood pressure 140/90, pulse 89, height 5\' 3"  (1.6 m), SpO2 98 %.   General: No apparent distress alert and oriented x3 mood and affect normal, dressed appropriately.  HEENT: Pupils equal, extraocular movements intact  Respiratory: Patient's speak in full sentences and does not appear short of breath  Cardiovascular: No lower extremity edema, non tender, no erythema  Skin: Warm dry intact with no signs of infection or rash on extremities or on axial skeleton.  Abdomen: Soft  nontender  Neuro: Cranial nerves II through XII are intact, neurovascularly intact in all extremities with 2+ DTRs and 2+ pulses.  Lymph: No lymphadenopathy of posterior or anterior cervical chain or axillae bilaterally.  Gait normal with good balance and coordination.  MSK:  Non tender with full range of motion and good stability and symmetric strength and tone of shoulders, elbows, wrist,  knee and ankles bilaterally.  Right hip exam shows the patient is some tenderness to palpation over the greater trochanteric area.  Negative straight leg test.  Discomfort noted on the paraspinal musculature lumbar spine right greater than left.  Deep tendon reflexes intact.  5 out of 5 strength.   Procedure: Real-time Ultrasound Guided Injection  of right greater trochanteric bursitis secondary to patient's body habitus Device: GE Logiq Q7 Ultrasound guided injection is preferred based studies that show increased duration, increased effect, greater accuracy, decreased procedural pain, increased response rate, and decreased cost with ultrasound guided versus blind injection.  Verbal informed consent obtained.  Time-out conducted.  Noted no overlying erythema, induration, or other signs of local infection.  Skin prepped in a sterile fashion.  Local anesthesia: Topical Ethyl chloride.  With sterile technique and under real time ultrasound guidance:  Greater trochanteric area was visualized and patient's bursa was noted. A 22-gauge 3 inch needle was inserted and 4 cc of 0.5% Marcaine and 1 cc of Kenalog 40 mg/dL was injected. Pictures taken Completed without difficulty  Pain immediately resolved suggesting accurate placement of the medication.  Advised to call if fevers/chills, erythema, induration, drainage, or persistent bleeding.  Images permanently stored and available for review in the ultrasound unit.  Impression: Technically successful ultrasound guided injection.    Impression and Recommendations:      This case required medical decision making of moderate complexity. The above documentation has been reviewed and is accurate and complete Judi SaaZachary M , DO       Note: This dictation was prepared with Dragon dictation along with smaller phrase technology. Any transcriptional errors that result from this process are unintentional.

## 2019-08-30 NOTE — Assessment & Plan Note (Signed)
Greater trochanteric bursitis of the right hip.  Responded with the injection.  Discussed icing regimen and home exercise.  Topical anti-inflammatories given.  Follow-up again in 4 to 8 weeks

## 2019-08-30 NOTE — Patient Instructions (Signed)
Good to see you Pennsaid small amount over the most painful spot See me again in 4-6 weeks

## 2019-09-20 ENCOUNTER — Ambulatory Visit: Payer: 59 | Admitting: Family Medicine

## 2019-09-27 ENCOUNTER — Ambulatory Visit: Payer: 59 | Admitting: Family Medicine

## 2019-09-29 ENCOUNTER — Ambulatory Visit: Payer: 59 | Admitting: Endocrinology

## 2019-10-23 ENCOUNTER — Other Ambulatory Visit: Payer: Self-pay

## 2019-10-23 DIAGNOSIS — E119 Type 2 diabetes mellitus without complications: Secondary | ICD-10-CM

## 2019-10-23 MED ORDER — TRULICITY 1.5 MG/0.5ML ~~LOC~~ SOAJ: 0.5000 mL | SUBCUTANEOUS | 2 refills | Status: DC

## 2019-10-23 MED ORDER — ONETOUCH VERIO VI STRP: 1.0000 | ORAL_STRIP | Freq: Every day | 0 refills | Status: DC

## 2019-12-13 ENCOUNTER — Ambulatory Visit: Payer: 59 | Admitting: Internal Medicine

## 2020-01-03 ENCOUNTER — Other Ambulatory Visit: Payer: Self-pay

## 2020-01-05 ENCOUNTER — Other Ambulatory Visit: Payer: Self-pay

## 2020-01-05 ENCOUNTER — Ambulatory Visit: Payer: 59 | Admitting: Endocrinology

## 2020-01-05 ENCOUNTER — Encounter: Payer: Self-pay | Admitting: Endocrinology

## 2020-01-05 VITALS — BP 144/82 | HR 105 | Ht 63.0 in | Wt 224.4 lb

## 2020-01-05 DIAGNOSIS — E119 Type 2 diabetes mellitus without complications: Secondary | ICD-10-CM | POA: Diagnosis not present

## 2020-01-05 LAB — POCT GLYCOSYLATED HEMOGLOBIN (HGB A1C): Hemoglobin A1C: 7.9 % — AB (ref 4.0–5.6)

## 2020-01-05 NOTE — Patient Instructions (Addendum)
Your blood pressure is high today.  Please see your primary care provider soon, to have it rechecked Blood tests are requested for you today.  We'll let you know about the results.    Please continue the same diabetes medications.   check your blood sugar once a day.  vary the time of day when you check, between before the 3 meals, and at bedtime.  also check if you have symptoms of your blood sugar being too high or too low.  please keep a record of the readings and bring it to your next appointment here (or you can bring the meter itself).  You can write it on any piece of paper.  please call us sooner if your blood sugar goes below 70, or if you have a lot of readings over 200.  Please come back for a follow-up appointment in 2-3 months.

## 2020-01-05 NOTE — Progress Notes (Signed)
Subjective:    Patient ID: [name] [name], female    DOB: 1971-10-02, 49 y.o.   MRN: [id number]  HPI Pt returns for f/u of diabetes mellitus:  DM type: 2 Dx'ed: 2011 Complications: nephropathy.   Therapy: 3 oral meds, and and Trulicity.   GDM: never DKA: never Severe hypoglycemia: never Pancreatitis: never Other: She declines weight-loss surgery; she has never taken insulin, but she has learned how.    Interval history:  pt states she feels well in general.  She takes meds as rx'ed.  She says cbg's are in the 100's.   Pt also has small multinodular goiter (dx'ed 2015; US showed the nodules too small to merit bx; f/u US in 2020 showed no signif change, and no need for f/u; she has been euthyroid on no rx).  She denies neck swelling.  Past Medical History:  Diagnosis Date  . Anemia, iron deficiency   . Chronic headache    Dr Vela Prose, Neg MRI 2005  . Diabetes mellitus   . Elevated glucose 2011  . GERD (gastroesophageal reflux disease)   . HTN (hypertension)     Past Surgical History:  Procedure Laterality Date  . ABDOMINAL HYSTERECTOMY    . BREAST REDUCTION SURGERY    . Cyst removed from vagina    . INDUCED ABORTION  1990-1994   x 4  . NOSE SURGERY    . PARTIAL HYSTERECTOMY  2010  . TUBAL LIGATION      Social History   Socioeconomic History  . Marital status: Single    Spouse name: Not on file  . Number of children: 2  . Years of education: Not on file  . Highest education level: Not on file  Occupational History  . Occupation: office specialist  Tobacco Use  . Smoking status: Never Smoker  . Smokeless tobacco: Never Used  Substance and Sexual Activity  . Alcohol use: Yes    Alcohol/week: 0.0 standard drinks    Comment: occasionally 2 per week  . Drug use: No  . Sexual activity: Not on file  Other Topics Concern  . Not on file  Social History Narrative   Regular exercise - YES   Social Determinants of Health   Financial Resource Strain:   . Difficulty  of Paying Living Expenses: Not on file  Food Insecurity:   . Worried About Programme researcher, broadcasting/film/video in the Last Year: Not on file  . Ran Out of Food in the Last Year: Not on file  Transportation Needs:   . Lack of Transportation (Medical): Not on file  . Lack of Transportation (Non-Medical): Not on file  Physical Activity:   . Days of Exercise per Week: Not on file  . Minutes of Exercise per Session: Not on file  Stress:   . Feeling of Stress : Not on file  Social Connections:   . Frequency of Communication with Friends and Family: Not on file  . Frequency of Social Gatherings with Friends and Family: Not on file  . Attends Religious Services: Not on file  . Active Member of Clubs or Organizations: Not on file  . Attends Banker Meetings: Not on file  . Marital Status: Not on file  Intimate Partner Violence:   . Fear of Current or Ex-Partner: Not on file  . Emotionally Abused: Not on file  . Physically Abused: Not on file  . Sexually Abused: Not on file    Current Outpatient Medications on File Prior to Visit  Medication Sig Dispense Refill  . budesonide-formoterol (SYMBICORT) 160-4.5 MCG/ACT inhaler Inhale 2 puffs into the lungs 2 (two) times daily. 1 Inhaler 11  . cetirizine (ZYRTEC) 10 MG tablet Take 1 tablet (10 mg total) by mouth daily. 30 tablet 0  . Cholecalciferol (VITAMIN D3) 50 MCG (2000 UT) capsule Take 1 capsule (2,000 Units total) by mouth daily. 100 capsule 3  . cyclobenzaprine (FLEXERIL) 5 MG tablet Take 1 tablet by mouth as needed.    . diclofenac (VOLTAREN) 75 MG EC tablet Take 1 tablet (75 mg total) by mouth 2 (two) times daily. 20 tablet 0  . Dulaglutide (TRULICITY) 1.5 JK/9.3OI SOPN Inject 0.5 mLs into the skin once a week. 4 pen 2  . empagliflozin (JARDIANCE) 25 MG TABS tablet Take 25 mg by mouth daily. 90 tablet 3  . fluticasone (FLONASE) 50 MCG/ACT nasal spray Place 2 sprays into both nostrils daily. 16 g 0  . glucose blood (ONETOUCH VERIO) test  strip 1 each by Other route daily. Use as instructed 100 each 0  . HYDROcodone-acetaminophen (NORCO/VICODIN) 5-325 MG tablet Take 1 tablet by mouth every 8 (eight) hours as needed for severe pain. 15 tablet 0  . metFORMIN (GLUCOPHAGE) 500 MG tablet Take 2 tablets (1,000 mg total) by mouth 2 (two) times daily with a meal. 120 tablet 11  . Multiple Vitamins-Calcium (ONE-A-DAY WOMENS PO) Take 1 tablet by mouth daily.    . Olmesartan-amLODIPine-HCTZ 40-10-12.5 MG TABS Take 1 tablet by mouth daily. Follow-up appt due in June must see provider for refills 30 tablet 3  . repaglinide (PRANDIN) 2 MG tablet Take 1 tablet (2 mg total) by mouth 3 (three) times daily before meals. 90 tablet 11  . Tetrahydrozoline-Zn Sulfate (EYE DROPS ALLERGY RELIEF OP) Place 1 drop into both eyes daily as needed (allergies).    Marland Kitchen tiZANidine (ZANAFLEX) 4 MG tablet TAKE 1 TABLET(4 MG) BY MOUTH EVERY 8 HOURS AS NEEDED FOR MUSCLE SPASMS 30 tablet 0  . Vitamin D, Ergocalciferol, (DRISDOL) 1.25 MG (50000 UT) CAPS capsule Take 1 capsule (50,000 Units total) by mouth every 7 (seven) days. 12 capsule 0   Current Facility-Administered Medications on File Prior to Visit  Medication Dose Route Frequency Provider Last Rate Last Admin  . 0.9 %  sodium chloride infusion  500 mL Intravenous Once Jackquline Denmark, MD        Allergies  Allergen Reactions  . Ciprofloxacin     REACTION: gittery  . Tramadol Other (See Comments)    Headaches worse    Family History  Problem Relation Age of Onset  . Hypertension Mother   . Esophageal cancer Mother 75  . Healthy Father   . Hypertension Maternal Grandmother   . Stroke Maternal Grandmother   . Ovarian cancer Sister   . Prostate cancer Maternal Grandfather     BP (!) 144/82 (BP Location: Left Arm, Patient Position: Sitting, Cuff Size: Large)   Pulse (!) 105   Ht 5\' 3"  (1.6 m)   Wt 224 lb 6.4 oz (101.8 kg)   SpO2 97%   BMI 39.75 kg/m    Review of Systems She denies hypoglycemia and  nausea.      Objective:   Physical Exam VITAL SIGNS:  See vs page GENERAL: no distress Pulses: dorsalis pedis intact bilat.   MSK: no deformity of the feet CV: no leg edema Skin:  no ulcer on the feet.  normal color and temp on the feet. Neuro: sensation is intact to touch on the  feet.   Lab Results  Component Value Date   CREATININE 1.07 06/13/2019   BUN 13 06/13/2019   NA 139 06/13/2019   K 3.6 06/13/2019   CL 102 06/13/2019   CO2 29 06/13/2019   Lab Results  Component Value Date   HGBA1C 7.9 (A) 01/05/2020       Assessment & Plan:  Type 2 DM, with DN: worse: I advised her to increase trulicity, but she declines HTN: is noted today  Patient Instructions  Your blood pressure is high today.  Please see your primary care provider soon, to have it rechecked Blood tests are requested for you today.  We'll let you know about the results.    Please continue the same diabetes medications.   check your blood sugar once a day.  vary the time of day when you check, between before the 3 meals, and at bedtime.  also check if you have symptoms of your blood sugar being too high or too low.  please keep a record of the readings and bring it to your next appointment here (or you can bring the meter itself).  You can write it on any piece of paper.  please call us sooner if your blood sugar goes below 70, or if you have a lot of readings over 200.  Please come back for a follow-up appointment in 2-3 months.

## 2020-01-06 LAB — TSH: TSH: 2.15 mIU/L

## 2020-04-04 ENCOUNTER — Other Ambulatory Visit: Payer: Self-pay

## 2020-04-05 ENCOUNTER — Ambulatory Visit: Payer: 59 | Admitting: Endocrinology

## 2020-04-05 ENCOUNTER — Encounter: Payer: Self-pay | Admitting: Endocrinology

## 2020-04-05 VITALS — BP 122/74 | HR 88 | Ht 63.0 in | Wt 222.8 lb

## 2020-04-05 DIAGNOSIS — E119 Type 2 diabetes mellitus without complications: Secondary | ICD-10-CM

## 2020-04-05 LAB — POCT GLYCOSYLATED HEMOGLOBIN (HGB A1C): Hemoglobin A1C: 8.2 % — AB (ref 4.0–5.6)

## 2020-04-05 MED ORDER — TRULICITY 3 MG/0.5ML ~~LOC~~ SOAJ: 3.0000 mg | SUBCUTANEOUS | 11 refills | Status: DC

## 2020-04-05 NOTE — Progress Notes (Signed)
Subjective:    Patient ID: [name] [name], female    DOB: 1971/09/28, 49 y.o.   MRN: [id number]  HPI Pt returns for f/u of diabetes mellitus:  DM type: 2 Dx'ed: 2011 Complications: nephropathy.   Therapy: 3 oral meds, and and Trulicity.   GDM: never DKA: never Severe hypoglycemia: never Pancreatitis: never Other: She declines weight-loss surgery; she has never taken insulin, but she has learned how.    Interval history:  pt states she feels well in general.  She takes meds as rx'ed.  She says cbg's are in the 100's.   Pt also has small multinodular goiter (dx'ed 2015; US showed the nodules too small to merit bx; f/u US in 2020 showed no signif change, and no need for f/u; she has been euthyroid on no rx).   Past Medical History:  Diagnosis Date  . Anemia, iron deficiency   . Chronic headache    Dr Vela Prose, Neg MRI 2005  . Diabetes mellitus   . Elevated glucose 2011  . GERD (gastroesophageal reflux disease)   . HTN (hypertension)     Past Surgical History:  Procedure Laterality Date  . ABDOMINAL HYSTERECTOMY    . BREAST REDUCTION SURGERY    . Cyst removed from vagina    . INDUCED ABORTION  1990-1994   x 4  . NOSE SURGERY    . PARTIAL HYSTERECTOMY  2010  . TUBAL LIGATION      Social History   Socioeconomic History  . Marital status: Single    Spouse name: Not on file  . Number of children: 2  . Years of education: Not on file  . Highest education level: Not on file  Occupational History  . Occupation: office specialist  Tobacco Use  . Smoking status: Never Smoker  . Smokeless tobacco: Never Used  Substance and Sexual Activity  . Alcohol use: Yes    Alcohol/week: 0.0 standard drinks    Comment: occasionally 2 per week  . Drug use: No  . Sexual activity: Not on file  Other Topics Concern  . Not on file  Social History Narrative   Regular exercise - YES   Social Determinants of Health   Financial Resource Strain:   . Difficulty of Paying Living  Expenses:   Food Insecurity:   . Worried About Programme researcher, broadcasting/film/video in the Last Year:   . Barista in the Last Year:   Transportation Needs:   . Freight forwarder (Medical):   Marland Kitchen Lack of Transportation (Non-Medical):   Physical Activity:   . Days of Exercise per Week:   . Minutes of Exercise per Session:   Stress:   . Feeling of Stress :   Social Connections:   . Frequency of Communication with Friends and Family:   . Frequency of Social Gatherings with Friends and Family:   . Attends Religious Services:   . Active Member of Clubs or Organizations:   . Attends Banker Meetings:   Marland Kitchen Marital Status:   Intimate Partner Violence:   . Fear of Current or Ex-Partner:   . Emotionally Abused:   Marland Kitchen Physically Abused:   . Sexually Abused:     Current Outpatient Medications on File Prior to Visit  Medication Sig Dispense Refill  . budesonide-formoterol (SYMBICORT) 160-4.5 MCG/ACT inhaler Inhale 2 puffs into the lungs 2 (two) times daily. 1 Inhaler 11  . cetirizine (ZYRTEC) 10 MG tablet Take 1 tablet (10 mg total) by mouth daily.  30 tablet 0  . Cholecalciferol (VITAMIN D3) 50 MCG (2000 UT) capsule Take 1 capsule (2,000 Units total) by mouth daily. 100 capsule 3  . cyclobenzaprine (FLEXERIL) 5 MG tablet Take 1 tablet by mouth as needed.    . diclofenac (VOLTAREN) 75 MG EC tablet Take 1 tablet (75 mg total) by mouth 2 (two) times daily. 20 tablet 0  . empagliflozin (JARDIANCE) 25 MG TABS tablet Take 25 mg by mouth daily. 90 tablet 3  . fluticasone (FLONASE) 50 MCG/ACT nasal spray Place 2 sprays into both nostrils daily. 16 g 0  . glucose blood (ONETOUCH VERIO) test strip 1 each by Other route daily. Use as instructed 100 each 0  . HYDROcodone-acetaminophen (NORCO/VICODIN) 5-325 MG tablet Take 1 tablet by mouth every 8 (eight) hours as needed for severe pain. 15 tablet 0  . metFORMIN (GLUCOPHAGE) 500 MG tablet Take 2 tablets (1,000 mg total) by mouth 2 (two) times daily  with a meal. 120 tablet 11  . Multiple Vitamins-Calcium (ONE-A-DAY WOMENS PO) Take 1 tablet by mouth daily.    . Olmesartan-amLODIPine-HCTZ 40-10-12.5 MG TABS Take 1 tablet by mouth daily. Follow-up appt due in June must see provider for refills 30 tablet 3  . repaglinide (PRANDIN) 2 MG tablet Take 1 tablet (2 mg total) by mouth 3 (three) times daily before meals. 90 tablet 11  . Tetrahydrozoline-Zn Sulfate (EYE DROPS ALLERGY RELIEF OP) Place 1 drop into both eyes daily as needed (allergies).    Marland Kitchen tiZANidine (ZANAFLEX) 4 MG tablet TAKE 1 TABLET(4 MG) BY MOUTH EVERY 8 HOURS AS NEEDED FOR MUSCLE SPASMS 30 tablet 0  . Vitamin D, Ergocalciferol, (DRISDOL) 1.25 MG (50000 UT) CAPS capsule Take 1 capsule (50,000 Units total) by mouth every 7 (seven) days. 12 capsule 0   Current Facility-Administered Medications on File Prior to Visit  Medication Dose Route Frequency Provider Last Rate Last Admin  . 0.9 %  sodium chloride infusion  500 mL Intravenous Once Lynann Bologna, MD        Allergies  Allergen Reactions  . Ciprofloxacin     REACTION: gittery  . Tramadol Other (See Comments)    Headaches worse    Family History  Problem Relation Age of Onset  . Hypertension Mother   . Esophageal cancer Mother 21  . Healthy Father   . Hypertension Maternal Grandmother   . Stroke Maternal Grandmother   . Ovarian cancer Sister   . Prostate cancer Maternal Grandfather     BP 122/74 (BP Location: Left Arm, Patient Position: Sitting, Cuff Size: Normal)   Pulse 88   Ht 5\' 3"  (1.6 m)   Wt 222 lb 12.8 oz (101.1 kg)   SpO2 98%   BMI 39.47 kg/m    Review of Systems Denies nausea.     Objective:   Physical Exam VITAL SIGNS:  See vs page GENERAL: no distress Pulses: dorsalis pedis intact bilat.   MSK: no deformity of the feet CV: no leg edema Skin:  no ulcer on the feet.  normal color and temp on the feet. Neuro: sensation is intact to touch on the feet.    Lab Results  Component Value Date    TSH 2.15 01/05/2020   A1c=8.2%      Assessment & Plan:  Type 2 DM, with DN: worse  Patient Instructions  I have sent a prescription to your pharmacy, to increase the Trulicity. Please continue the same diabetes medications.   check your blood sugar once a day.  vary the time of day when you check, between before the 3 meals, and at bedtime.  also check if you have symptoms of your blood sugar being too high or too low.  please keep a record of the readings and bring it to your next appointment here (or you can bring the meter itself).  You can write it on any piece of paper.  please call us sooner if your blood sugar goes below 70, or if you have a lot of readings over 200.  Please come back for a follow-up appointment in 3 months.

## 2020-04-05 NOTE — Patient Instructions (Addendum)
I have sent a prescription to your pharmacy, to increase the Trulicity. Please continue the same diabetes medications.   check your blood sugar once a day.  vary the time of day when you check, between before the 3 meals, and at bedtime.  also check if you have symptoms of your blood sugar being too high or too low.  please keep a record of the readings and bring it to your next appointment here (or you can bring the meter itself).  You can write it on any piece of paper.  please call us sooner if your blood sugar goes below 70, or if you have a lot of readings over 200.  Please come back for a follow-up appointment in 3 months.

## 2020-05-27 ENCOUNTER — Encounter: Payer: Self-pay | Admitting: Family

## 2020-05-27 ENCOUNTER — Other Ambulatory Visit: Payer: Self-pay

## 2020-05-27 ENCOUNTER — Ambulatory Visit: Payer: 59 | Admitting: Family

## 2020-05-27 ENCOUNTER — Ambulatory Visit (INDEPENDENT_AMBULATORY_CARE_PROVIDER_SITE_OTHER): Payer: 59

## 2020-05-27 ENCOUNTER — Other Ambulatory Visit: Payer: Self-pay | Admitting: Family

## 2020-05-27 VITALS — BP 130/86 | HR 89 | Temp 98.3°F | Ht 63.0 in | Wt 220.0 lb

## 2020-05-27 DIAGNOSIS — M545 Low back pain, unspecified: Secondary | ICD-10-CM

## 2020-05-27 DIAGNOSIS — Z87440 Personal history of urinary (tract) infections: Secondary | ICD-10-CM | POA: Diagnosis not present

## 2020-05-27 MED ORDER — MELOXICAM 15 MG PO TABS: 15.0000 mg | ORAL_TABLET | Freq: Every day | ORAL | 0 refills | Status: DC

## 2020-05-27 MED ORDER — METHOCARBAMOL 500 MG PO TABS: 500.0000 mg | ORAL_TABLET | Freq: Three times a day (TID) | ORAL | 0 refills | Status: DC | PRN

## 2020-05-27 NOTE — Patient Instructions (Signed)
Muscle Cramps and Spasms Muscle cramps and spasms are when muscles tighten by themselves. They usually get better within minutes. Muscle cramps are painful. They are usually stronger and last longer than muscle spasms. Muscle spasms may or may not be painful. They can last a few seconds or much longer. Cramps and spasms can affect any muscle, but they occur most often in the calf muscles of the leg. They are usually not caused by a serious problem. In many cases, the cause is not known. Some common causes include:  Doing more physical work or exercise than your body is ready for.  Using the muscles too much (overuse) by repeating certain movements too many times.  Staying in a certain position for a long time.  Playing a sport or doing an activity without preparing properly.  Using bad form or technique while playing a sport or doing an activity.  Not having enough water in your body (dehydration).  Injury.  Side effects of some medicines.  Low levels of the salts and minerals in your blood (electrolytes), such as low potassium or calcium. Follow these instructions at home: Managing pain and stiffness      Massage, stretch, and relax the muscle. Do this for many minutes at a time.  If told, put heat on tight or tense muscles as often as told by your doctor. Use the heat source that your doctor recommends, such as a moist heat pack or a heating pad. ? Place a towel between your skin and the heat source. ? Leave the heat on for 20-30 minutes. ? Remove the heat if your skin turns bright red. This is very important if you are not able to feel pain, heat, or cold. You may have a greater risk of getting burned.  If told, put ice on the affected area. This may help if you are sore or have pain after a cramp or spasm. ? Put ice in a plastic bag. ? Place a towel between your skin and the bag. ? Leave the ice on for 20 minutes, 2-3 times a day.  Try taking hot showers or baths to help  relax tight muscles. Eating and drinking  Drink enough fluid to keep your pee (urine) pale yellow.  Eat a healthy diet to help ensure that your muscles work well. This should include: ? Fruits and vegetables. ? Lean protein. ? Whole grains. ? Low-fat or nonfat dairy products. General instructions  If you are having cramps often, avoid intense exercise for several days.  Take over-the-counter and prescription medicines only as told by your doctor.  Watch for any changes in your symptoms.  Keep all follow-up visits as told by your doctor. This is important. Contact a doctor if:  Your cramps or spasms get worse or happen more often.  Your cramps or spasms do not get better with time. Summary  Muscle cramps and spasms are when muscles tighten by themselves. They usually get better within minutes.  Cramps and spasms occur most often in the calf muscles of the leg.  Massage, stretch, and relax the muscle. This may help the cramp or spasm go away.  Drink enough fluid to keep your pee (urine) pale yellow. This information is not intended to replace advice given to you by your health care provider. Make sure you discuss any questions you have with your health care provider. Document Revised: 04/25/2018 Document Reviewed: 04/25/2018 Elsevier Patient Education  2020 [employer]  

## 2020-05-27 NOTE — Progress Notes (Signed)
[name] [name] is a 49 y.o. female with the following history as recorded in [facility]:  Patient Active Problem List   Diagnosis Date Noted   Greater trochanteric bursitis of right hip 08/30/2019   Leg wound, left 06/20/2019   Bacterial vaginosis 04/12/2019   Pruritus of vulva 04/12/2019   Callus of foot 12/13/2018   Bursitis of right shoulder 09/01/2017   Dyslipidemia 08/17/2017   Shoulder pain, right 08/17/2017   Mild intermittent asthma with acute exacerbation 08/04/2017   RTI (respiratory tract infection) 08/04/2017   Dysuria 03/16/2017   Tick bite of back 03/16/2017   Multinodular goiter 11/03/2016   Wellness examination 11/03/2016   Ear itching 08/21/2016   Goiter 03/07/2014   Low back pain 10/13/2012   Herpes zoster 10/13/2012   Cough 04/20/2011   Vitamin D deficiency 03/08/2011   Diabetes type 2, controlled (HCC) 03/06/2011   INSOMNIA, CHRONIC 07/28/2010   OBESITY 06/24/2009   GERD 03/11/2009   ANEMIA-IRON DEFICIENCY 10/19/2007   Essential hypertension 10/19/2007    Current Outpatient Medications  Medication Sig Dispense Refill   cetirizine (ZYRTEC) 10 MG tablet Take 1 tablet (10 mg total) by mouth daily. 30 tablet 0   Cholecalciferol (VITAMIN D3) 50 MCG (2000 UT) capsule Take 1 capsule (2,000 Units total) by mouth daily. 100 capsule 3   Dulaglutide (TRULICITY) 3 MG/0.5ML SOPN Inject 3 mg into the skin once a week. 4 pen 11   empagliflozin (JARDIANCE) 25 MG TABS tablet Take 25 mg by mouth daily. 90 tablet 3   fluticasone (FLONASE) 50 MCG/ACT nasal spray Place 2 sprays into both nostrils daily. 16 g 0   glucose blood (ONETOUCH VERIO) test strip 1 each by Other route daily. Use as instructed 100 each 0   metFORMIN (GLUCOPHAGE) 500 MG tablet Take 2 tablets (1,000 mg total) by mouth 2 (two) times daily with a meal. 120 tablet 11   Multiple Vitamins-Calcium (ONE-A-DAY WOMENS PO) Take 1 tablet by mouth daily.      Olmesartan-amLODIPine-HCTZ 40-10-12.5 MG TABS Take 1 tablet by mouth daily. Follow-up appt due in June must see provider for refills 30 tablet 3   repaglinide (PRANDIN) 2 MG tablet Take 1 tablet (2 mg total) by mouth 3 (three) times daily before meals. 90 tablet 11   Tetrahydrozoline-Zn Sulfate (EYE DROPS ALLERGY RELIEF OP) Place 1 drop into both eyes daily as needed (allergies).     Vitamin D, Ergocalciferol, (DRISDOL) 1.25 MG (50000 UT) CAPS capsule Take 1 capsule (50,000 Units total) by mouth every 7 (seven) days. 12 capsule 0   budesonide-formoterol (SYMBICORT) 160-4.5 MCG/ACT inhaler Inhale 2 puffs into the lungs 2 (two) times daily. 1 Inhaler 11   meloxicam (MOBIC) 15 MG tablet Take 1 tablet (15 mg total) by mouth daily. 30 tablet 0   methocarbamol (ROBAXIN) 500 MG tablet Take 1 tablet (500 mg total) by mouth every 8 (eight) hours as needed for muscle spasms. 30 tablet 0   No current facility-administered medications for this visit.    Allergies: Ciprofloxacin and Tramadol  Past Medical History:  Diagnosis Date   Anemia, iron deficiency    Chronic headache    Dr Vela Prose, Neg MRI 2005   Diabetes mellitus    Elevated glucose 2011   GERD (gastroesophageal reflux disease)    HTN (hypertension)     Past Surgical History:  Procedure Laterality Date   ABDOMINAL HYSTERECTOMY     BREAST REDUCTION SURGERY     Cyst removed from vagina     INDUCED  ABORTION  1990-1994   x 4   NOSE SURGERY     PARTIAL HYSTERECTOMY  2010   TUBAL LIGATION      Family History  Problem Relation Age of Onset   Hypertension Mother    Esophageal cancer Mother 63   Healthy Father    Hypertension Maternal Grandmother    Stroke Maternal Grandmother    Ovarian cancer Sister    Prostate cancer Maternal Grandfather     Social History   Tobacco Use   Smoking status: Never Smoker   Smokeless tobacco: Never Used  Substance Use Topics   Alcohol use: Yes    Alcohol/week: 0.0  standard drinks    Comment: occasionally 2 per week    Subjective:  Low back pain x 5-6 days- "woke up Wednesday and my back was just hurting"; has been doing more exercise with kettle ball/ medicine ball;  feels like localized over left side of her back; does have occasional numbness in lower extremity; limited relief with Ibuprofen and Biofreeze; has had problems with her back in the past but does not remember ever having any type of imaging done. Was treated for UTI 3 weeks ago by her GYN but feels that those symptoms cleared completely.      Objective:  Vitals:   05/27/20 1114  BP: 130/86  Pulse: 89  Temp: 98.3 F (36.8 C)  TempSrc: Oral  SpO2: 96%  Weight: 220 lb (99.8 kg)  Height: 5\' 3"  (1.6 m)    General: Well developed, well nourished, in no acute distress  Skin : Warm and dry.  Head: Normocephalic and atraumatic  Lungs: Respirations unlabored; clear to auscultation bilaterally without wheeze, rales, rhonchi  Musculoskeletal: No deformities; no active joint inflammation  Extremities: No edema, cyanosis, clubbing  Vessels: Symmetric bilaterally  Neurologic: Alert and oriented; speech intact; face symmetrical; moves all extremities well; CNII-XII intact without focal deficit   Assessment:  1. Acute left-sided low back pain, unspecified whether sciatica present   2. History of UTI     Plan:  1. Suspect muscular; update X-ray today due to recurrent episodes of back pain; Rx for Mobic and Robaxin; apply heat, rest; work note given as requested; follow-up worse, no better- may need to consider PT; 2. Check urine culture to ensure resolution;   This visit occurred during the SARS-CoV-2 public health emergency.  Safety protocols were in place, including screening questions prior to the visit, additional usage of staff PPE, and extensive cleaning of exam room while observing appropriate contact time as indicated for disinfecting solutions.     No follow-ups on file.  Orders  Placed This Encounter  Procedures   Urine Culture   DG Lumbar Spine Complete    Order Specific Question:   Reason for Exam (SYMPTOM  OR DIAGNOSIS REQUIRED)    Answer:   low back pain    Order Specific Question:   Is patient pregnant?    Answer:   No    Order Specific Question:   Preferred imaging location?    Answer:      Order Specific Question:   Radiology Contrast Protocol - do NOT remove file path    Answer:   [email or web address]    Requested Prescriptions   Signed Prescriptions Disp Refills   meloxicam (MOBIC) 15 MG tablet 30 tablet 0    Sig: Take 1 tablet (15 mg total) by mouth daily.   methocarbamol (ROBAXIN) 500 MG tablet 30 tablet 0    Sig:  Take 1 tablet (500 mg total) by mouth every 8 (eight) hours as needed for muscle spasms.

## 2020-05-28 LAB — URINE CULTURE

## 2020-06-24 ENCOUNTER — Encounter: Payer: Self-pay | Admitting: Internal Medicine

## 2020-06-24 ENCOUNTER — Other Ambulatory Visit: Payer: Self-pay

## 2020-06-24 ENCOUNTER — Ambulatory Visit: Payer: 59 | Admitting: Internal Medicine

## 2020-06-24 DIAGNOSIS — E1169 Type 2 diabetes mellitus with other specified complication: Secondary | ICD-10-CM

## 2020-06-24 DIAGNOSIS — K529 Noninfective gastroenteritis and colitis, unspecified: Secondary | ICD-10-CM

## 2020-06-24 DIAGNOSIS — I10 Essential (primary) hypertension: Secondary | ICD-10-CM | POA: Diagnosis not present

## 2020-06-24 MED ORDER — OLMESARTAN-AMLODIPINE-HCTZ 40-10-25 MG PO TABS: 1.0000 | ORAL_TABLET | Freq: Every day | ORAL | 0 refills | Status: DC

## 2020-06-24 NOTE — Progress Notes (Signed)
   Subjective:   Patient ID: [name] [name], female    DOB: 1971/07/02, 49 y.o.   MRN: [id number]  HPI The patient is a 49 YO female coming in for concerns about belching/GI symptoms. Noted starting Saturday after eating tacos. Denies fevers or chills or blood in diarrhea. Having diarrhea, nausea, belching, distention. Has tried pepto bismol with some relief hours later. Overall it is improving and no diarrhea today. Denies diarrhea today or vomiting through this. Sunday had to leave work due to feeling unwell. She was prescribed meloxicam last month but only took this for a couple days and has not taken in some time. She is also having worries about her blood pressure (since her diabetes doctor added jardiance to her med list he cut back the dose of her bp medication, she has been having volatile BP readings at home, some to 160s/90s, wants to go back to prior dosing, is not taking the jardiance and never did) and diabetes (not taking many of the medications on her list, wants to know if this could be contributing, taking only trulicity of her diabetes medications, denies excessive thirst or urination, is not at goal with most recent HgA1c 8.2).   Review of Systems  Constitutional: Positive for appetite change.  HENT: Negative.   Eyes: Negative.   Respiratory: Negative for cough, chest tightness and shortness of breath.   Cardiovascular: Negative for chest pain, palpitations and leg swelling.  Gastrointestinal: Positive for abdominal distention, diarrhea and nausea. Negative for abdominal pain, anal bleeding, blood in stool, constipation, rectal pain and vomiting.  Musculoskeletal: Negative.   Skin: Negative.   Neurological: Negative.   Psychiatric/Behavioral: Negative.     Objective:  Physical Exam Constitutional:      Appearance: She is well-developed. She is obese.  HENT:     Head: Normocephalic and atraumatic.  Cardiovascular:     Rate and Rhythm: Normal rate and regular rhythm.    Pulmonary:     Effort: Pulmonary effort is normal. No respiratory distress.     Breath sounds: Normal breath sounds. No wheezing or rales.  Abdominal:     General: Bowel sounds are normal. There is distension.     Palpations: Abdomen is soft.     Tenderness: There is abdominal tenderness. There is no guarding or rebound.  Musculoskeletal:     Cervical back: Normal range of motion.  Skin:    General: Skin is warm and dry.  Neurological:     Mental Status: She is alert and oriented to person, place, and time.     Coordination: Coordination normal.     Vitals:   06/24/20 0931  BP: 136/88  Pulse: 94  Temp: 98.9 F (37.2 C)  TempSrc: Oral  SpO2: 95%  Weight: 219 lb (99.3 kg)  Height: 5\' 3"  (1.6 m)    This visit occurred during the SARS-CoV-2 public health emergency.  Safety protocols were in place, including screening questions prior to the visit, additional usage of staff PPE, and extensive cleaning of exam room while observing appropriate contact time as indicated for disinfecting solutions.   Assessment & Plan:

## 2020-06-24 NOTE — Patient Instructions (Signed)
We have sent in the higher dose of the blood pressure medicine to switch back to taking.  Come back in about 3-4 weeks with Dr. Posey Rea to check the blood pressure and labs to make sure this is working well.   You can try beano or gas-x over the counter for the stomach.   Food Choices to Help Relieve Diarrhea, Adult When you have diarrhea, the foods you eat and your eating habits are very important. Choosing the right foods and drinks can help:  Relieve diarrhea.  Replace lost fluids and nutrients.  Prevent dehydration. What general guidelines should I follow?  Relieving diarrhea  Choose foods with less than 2 g or .07 oz. of fiber per serving.  Limit fats to less than 8 tsp (38 g or 1.34 oz.) a day.  Avoid the following: ? Foods and beverages sweetened with high-fructose corn syrup, honey, or sugar alcohols such as xylitol, sorbitol, and mannitol. ? Foods that contain a lot of fat or sugar. ? Fried, greasy, or spicy foods. ? High-fiber grains, breads, and cereals. ? Raw fruits and vegetables.  Eat foods that are rich in probiotics. These foods include dairy products such as yogurt and fermented milk products. They help increase healthy bacteria in the stomach and intestines (gastrointestinal tract, or GI tract).  If you have lactose intolerance, avoid dairy products. These may make your diarrhea worse.  Take medicine to help stop diarrhea (antidiarrheal medicine) only as told by your health care provider. Replacing nutrients  Eat small meals or snacks every 3-4 hours.  Eat bland foods, such as white rice, toast, or baked potato, until your diarrhea starts to get better. Gradually reintroduce nutrient-rich foods as tolerated or as told by your health care provider. This includes: ? Well-cooked protein foods. ? Peeled, seeded, and soft-cooked fruits and vegetables. ? Low-fat dairy products.  Take vitamin and mineral supplements as told by your health care  provider. Preventing dehydration  Start by sipping water or a special solution to prevent dehydration (oral rehydration solution, ORS). Urine that is clear or pale yellow means that you are getting enough fluid.  Try to drink at least 8-10 cups of fluid each day to help replace lost fluids.  You may add other liquids in addition to water, such as clear juice or decaffeinated sports drinks, as tolerated or as told by your health care provider.  Avoid drinks with caffeine, such as coffee, tea, or soft drinks.  Avoid alcohol. What foods are recommended?     The items listed may not be a complete list. Talk with your health care provider about what dietary choices are best for you. Grains White rice. White, [place], or pita breads (fresh or toasted), including plain rolls, buns, or bagels. White pasta. Saltine, soda, or graham crackers. Pretzels. Low-fiber cereal. Cooked cereals made with water (such as cornmeal, farina, or cream cereals). Plain muffins. Matzo. Melba toast. Zwieback. Vegetables Potatoes (without the skin). Most well-cooked and canned vegetables without skins or seeds. Tender lettuce. Fruits Apple sauce. Fruits canned in juice. Cooked apricots, cherries, grapefruit, peaches, pears, or plums. Fresh bananas and cantaloupe. Meats and other protein foods Baked or boiled chicken. Eggs. Tofu. Fish. Seafood. Smooth nut butters. Ground or well-cooked tender beef, ham, veal, lamb, pork, or poultry. Dairy Plain yogurt, kefir, and unsweetened liquid yogurt. Lactose-free milk, buttermilk, skim milk, or soy milk. Low-fat or nonfat hard cheese. Beverages Water. Low-calorie sports drinks. Fruit juices without pulp. Strained tomato and vegetable juices. Decaffeinated teas. Sugar-free beverages  not sweetened with sugar alcohols. Oral rehydration solutions, if approved by your health care provider. Seasoning and other foods Bouillon, broth, or soups made from recommended foods. What foods are  not recommended? The items listed may not be a complete list. Talk with your health care provider about what dietary choices are best for you. Grains Whole grain, whole wheat, bran, or rye breads, rolls, pastas, and crackers. Wild or Florio rice. Whole grain or bran cereals. Barley. Oats and oatmeal. Corn tortillas or taco shells. Granola. Popcorn. Vegetables Raw vegetables. Fried vegetables. Cabbage, broccoli, Brussels sprouts, artichokes, baked beans, beet greens, corn, kale, legumes, peas, sweet potatoes, and yams. Potato skins. Cooked spinach and cabbage. Fruits Dried fruit, including raisins and dates. Raw fruits. Stewed or dried prunes. Canned fruits with syrup. Meat and other protein foods Fried or fatty meats. Deli meats. Chunky nut butters. Nuts and seeds. Beans and lentils. Tomasa Blase. Hot dogs. Sausage. Dairy High-fat cheeses. Whole milk, chocolate milk, and beverages made with milk, such as milk shakes. Half-and-half. Cream. sour cream. Ice cream. Beverages Caffeinated beverages (such as coffee, tea, soda, or energy drinks). Alcoholic beverages. Fruit juices with pulp. Prune juice. Soft drinks sweetened with high-fructose corn syrup or sugar alcohols. High-calorie sports drinks. Fats and oils Butter. Cream sauces. Margarine. Salad oils. Plain salad dressings. Olives. Avocados. Mayonnaise. Sweets and desserts Sweet rolls, doughnuts, and sweet breads. Sugar-free desserts sweetened with sugar alcohols such as xylitol and sorbitol. Seasoning and other foods Honey. Hot sauce. Chili powder. Gravy. Cream-based or milk-based soups. Pancakes and waffles. Summary  When you have diarrhea, the foods you eat and your eating habits are very important.  Make sure you get at least 8-10 cups of fluid each day, or enough to keep your urine clear or pale yellow.  Eat bland foods and gradually reintroduce healthy, nutrient-rich foods as tolerated, or as told by your health care provider.  Avoid  high-fiber, fried, greasy, or spicy foods. This information is not intended to replace advice given to you by your health care provider. Make sure you discuss any questions you have with your health care provider. Document Revised: 03/23/2019 Document Reviewed: 11/27/2016 Elsevier Patient Education  2020 [employer].

## 2020-06-24 NOTE — Assessment & Plan Note (Signed)
Refill her prior dosing of olmesartan/amlodipine/hctz 40/10/25. Advised to see PCP in 1 month for labs and BP check.

## 2020-06-24 NOTE — Assessment & Plan Note (Signed)
Will notify her endocrine provider that she is not taking almost all of her diabetes medications. She is taking trulicity only for diabetes currently. She is on ARB but not statin. Advised to follow up with her PCP in 1 month for follow up labs.

## 2020-06-24 NOTE — Assessment & Plan Note (Signed)
Counseled on BRAT diet, reassurance given. Can take gas-x or beano otc for the bloating.

## 2020-07-03 ENCOUNTER — Emergency Department (HOSPITAL_COMMUNITY)
Admission: EM | Admit: 2020-07-03 | Discharge: 2020-07-03 | Disposition: A | Discharge status: Home / Self Care | Payer: 59 | Attending: Emergency Medicine | Admitting: Emergency Medicine

## 2020-07-03 ENCOUNTER — Emergency Department (HOSPITAL_COMMUNITY): Payer: 59

## 2020-07-03 ENCOUNTER — Encounter (HOSPITAL_COMMUNITY): Payer: Self-pay | Admitting: Emergency Medicine

## 2020-07-03 ENCOUNTER — Other Ambulatory Visit: Payer: Self-pay

## 2020-07-03 DIAGNOSIS — Z20822 Contact with and (suspected) exposure to covid-19: Secondary | ICD-10-CM | POA: Insufficient documentation

## 2020-07-03 DIAGNOSIS — Z79899 Other long term (current) drug therapy: Secondary | ICD-10-CM | POA: Diagnosis not present

## 2020-07-03 DIAGNOSIS — E119 Type 2 diabetes mellitus without complications: Secondary | ICD-10-CM | POA: Diagnosis not present

## 2020-07-03 DIAGNOSIS — R079 Chest pain, unspecified: Secondary | ICD-10-CM

## 2020-07-03 DIAGNOSIS — I10 Essential (primary) hypertension: Secondary | ICD-10-CM | POA: Diagnosis not present

## 2020-07-03 DIAGNOSIS — R0789 Other chest pain: Secondary | ICD-10-CM | POA: Insufficient documentation

## 2020-07-03 DIAGNOSIS — K219 Gastro-esophageal reflux disease without esophagitis: Secondary | ICD-10-CM | POA: Diagnosis not present

## 2020-07-03 DIAGNOSIS — Z7984 Long term (current) use of oral hypoglycemic drugs: Secondary | ICD-10-CM | POA: Insufficient documentation

## 2020-07-03 DIAGNOSIS — R1011 Right upper quadrant pain: Secondary | ICD-10-CM | POA: Diagnosis not present

## 2020-07-03 LAB — SARS CORONAVIRUS 2 BY RT PCR (HOSPITAL ORDER, PERFORMED IN ~~LOC~~ HOSPITAL LAB): SARS Coronavirus 2: NEGATIVE

## 2020-07-03 LAB — URINALYSIS, ROUTINE W REFLEX MICROSCOPIC
Bacteria, UA: NONE SEEN
Bilirubin Urine: NEGATIVE
Glucose, UA: NEGATIVE mg/dL
Hgb urine dipstick: NEGATIVE
Ketones, ur: NEGATIVE mg/dL
Leukocytes,Ua: NEGATIVE
Nitrite: NEGATIVE
Protein, ur: 30 mg/dL — AB
Specific Gravity, Urine: 1.016 (ref 1.005–1.030)
pH: 8 (ref 5.0–8.0)

## 2020-07-03 LAB — BASIC METABOLIC PANEL
Anion gap: 9 (ref 5–15)
BUN: 15 mg/dL (ref 6–20)
CO2: 29 mmol/L (ref 22–32)
Calcium: 9.1 mg/dL (ref 8.9–10.3)
Chloride: 99 mmol/L (ref 98–111)
Creatinine, Ser: 1.05 mg/dL — ABNORMAL HIGH (ref 0.44–1.00)
GFR calc Af Amer: >60 mL/min (ref >60)
GFR calc non Af Amer: >60 mL/min (ref >60)
Glucose, Bld: 216 mg/dL — ABNORMAL HIGH (ref 70–99)
Potassium: 4.2 mmol/L (ref 3.5–5.1)
Sodium: 137 mmol/L (ref 135–145)

## 2020-07-03 LAB — CBC
HCT: 46.5 % — ABNORMAL HIGH (ref 36.0–46.0)
Hemoglobin: 15.5 g/dL — ABNORMAL HIGH (ref 12.0–15.0)
MCH: 28.9 pg (ref 26.0–34.0)
MCHC: 33.3 g/dL (ref 30.0–36.0)
MCV: 86.8 fL (ref 80.0–100.0)
Platelets: 334 10*3/uL (ref 150–400)
RBC: 5.36 MIL/uL — ABNORMAL HIGH (ref 3.87–5.11)
RDW: 13.5 % (ref 11.5–15.5)
WBC: 12.3 10*3/uL — ABNORMAL HIGH (ref 4.0–10.5)
nRBC: 0 % (ref 0.0–0.2)

## 2020-07-03 LAB — HEPATIC FUNCTION PANEL
ALT: 19 U/L (ref 0–44)
AST: 19 U/L (ref 15–41)
Albumin: 3.5 g/dL (ref 3.5–5.0)
Alkaline Phosphatase: 69 U/L (ref 38–126)
Bilirubin, Direct: 0.1 mg/dL (ref 0.0–0.2)
Indirect Bilirubin: 0.7 mg/dL (ref 0.3–0.9)
Total Bilirubin: 0.8 mg/dL (ref 0.3–1.2)
Total Protein: 6.8 g/dL (ref 6.5–8.1)

## 2020-07-03 LAB — D-DIMER, QUANTITATIVE: D-Dimer, Quant: 0.57 ug/mL-FEU — ABNORMAL HIGH (ref 0.00–0.50)

## 2020-07-03 LAB — TROPONIN I (HIGH SENSITIVITY)
Troponin I (High Sensitivity): 4 ng/L (ref <18)
Troponin I (High Sensitivity): 4 ng/L (ref <18)

## 2020-07-03 MED ORDER — ALUM & MAG HYDROXIDE-SIMETH 200-200-20 MG/5ML PO SUSP
30.0000 mL | Freq: Once | ORAL | Status: AC
  Administered 2020-07-03: 30 mL via ORAL
  Filled 2020-07-03: qty 30

## 2020-07-03 MED ORDER — OMEPRAZOLE 20 MG PO CPDR
20.0000 mg | DELAYED_RELEASE_CAPSULE | Freq: Every day | ORAL | 0 refills | Status: DC
Start: 2020-07-03 — End: 2021-08-14

## 2020-07-03 MED ORDER — SODIUM CHLORIDE 0.9% FLUSH: 3.0000 mL | Freq: Once | INTRAVENOUS | Status: DC

## 2020-07-03 MED ORDER — FAMOTIDINE IN NACL 20-0.9 MG/50ML-% IV SOLN
20.0000 mg | Freq: Once | INTRAVENOUS | Status: AC
  Administered 2020-07-03: 20 mg via INTRAVENOUS
  Filled 2020-07-03: qty 50

## 2020-07-03 MED ORDER — ONDANSETRON HCL 4 MG/2ML IJ SOLN
4.0000 mg | Freq: Once | INTRAMUSCULAR | Status: AC
  Administered 2020-07-03: 4 mg via INTRAVENOUS
  Filled 2020-07-03: qty 2

## 2020-07-03 MED ORDER — SODIUM CHLORIDE 0.9 % IV BOLUS
1000.0000 mL | Freq: Once | INTRAVENOUS | Status: AC
  Administered 2020-07-03: 1000 mL via INTRAVENOUS

## 2020-07-03 MED ORDER — SUCRALFATE 1 GM/10ML PO SUSP
1.0000 g | Freq: Three times a day (TID) | ORAL | 0 refills | Status: DC
Start: 2020-07-03 — End: 2021-08-14

## 2020-07-03 MED ORDER — SUCRALFATE 1 GM/10ML PO SUSP
1.0000 g | Freq: Once | ORAL | Status: AC
  Administered 2020-07-03: 1 g via ORAL
  Filled 2020-07-03: qty 10

## 2020-07-03 MED ORDER — IOHEXOL 350 MG/ML SOLN
100.0000 mL | Freq: Once | INTRAVENOUS | Status: AC | PRN
  Administered 2020-07-03: 100 mL via INTRAVENOUS

## 2020-07-03 NOTE — ED Provider Notes (Signed)
Care assumed at shift change from [place], pending [place]. See her note for full HPI and workup. Briefly, pt presenting with chest pain and abdominal pain. Pt reports to me that she was laying down for a facial, and began having belching and chest pain with nausea. Pain is also to her upper abdomen, some burning some tightness. She had an episode of vomiting as well. Has history of GERD and used to treat with prilosec, hasn't taken any in some time. No frequent alcohol use or NSAID use.   Labs neg except d-dimer - recent travel. If CT scans are neg, d/c with outpatient f/u. Physical Exam  BP (!) 168/78   Pulse 87   Temp 98.5 F (36.9 C)   Resp 17   SpO2 100%   Physical Exam Vitals and nursing note reviewed.  Constitutional:      Appearance: She is well-developed. She is not ill-appearing.  HENT:     Head: Normocephalic and atraumatic.  Eyes:     Conjunctiva/sclera: Conjunctivae normal.  Cardiovascular:     Rate and Rhythm: Normal rate and regular rhythm.  Pulmonary:     Effort: Pulmonary effort is normal. No respiratory distress.  Abdominal:     General: Bowel sounds are normal.     Palpations: Abdomen is soft.     Tenderness: There is abdominal tenderness in the right upper quadrant, epigastric area and left upper quadrant. There is no guarding or rebound.  Musculoskeletal:     Right lower leg: No edema.     Left lower leg: No edema.  Skin:    General: Skin is warm.  Neurological:     Mental Status: She is alert.  Psychiatric:        Behavior: Behavior normal.    Results for orders placed or performed during the hospital encounter of 07/03/20  SARS Coronavirus 2 by RT PCR (hospital order, [facility]) Nasopharyngeal Nasopharyngeal Swab   Specimen: Nasopharyngeal Swab  Result Value Ref Range   SARS Coronavirus 2 NEGATIVE NEGATIVE  Basic metabolic panel  Result Value Ref Range   Sodium 137 135 - 145 mmol/L   Potassium 4.2 3.5 - 5.1 mmol/L    Chloride 99 98 - 111 mmol/L   CO2 29 22 - 32 mmol/L   Glucose, Bld 216 (H) 70 - 99 mg/dL   BUN 15 6 - 20 mg/dL   Creatinine, Ser 1.611.05 (H) 0.44 - 1.00 mg/dL   Calcium 9.1 8.9 - 09.610.3 mg/dL   GFR calc non Af Amer >60 >60 mL/min   GFR calc Af Amer >60 >60 mL/min   Anion gap 9 5 - 15  CBC  Result Value Ref Range   WBC 12.3 (H) 4.0 - 10.5 K/uL   RBC 5.36 (H) 3.87 - 5.11 MIL/uL   Hemoglobin 15.5 (H) 12.0 - 15.0 g/dL   HCT 04.546.5 (H) 36 - 46 %   MCV 86.8 80.0 - 100.0 fL   MCH 28.9 26.0 - 34.0 pg   MCHC 33.3 30.0 - 36.0 g/dL   RDW 40.913.5 81.111.5 - 91.415.5 %   Platelets 334 150 - 400 K/uL   nRBC 0.0 0.0 - 0.2 %  D-dimer, quantitative (not at [facility])  Result Value Ref Range   D-Dimer, Quant 0.57 (H) 0.00 - 0.50 ug/mL-FEU  Hepatic function panel  Result Value Ref Range   Total Protein 6.8 6.5 - 8.1 g/dL   Albumin 3.5 3.5 - 5.0 g/dL   AST 19 15 -  41 U/L   ALT 19 0 - 44 U/L   Alkaline Phosphatase 69 38 - 126 U/L   Total Bilirubin 0.8 0.3 - 1.2 mg/dL   Bilirubin, Direct 0.1 0.0 - 0.2 mg/dL   Indirect Bilirubin 0.7 0.3 - 0.9 mg/dL  Urinalysis, Routine w reflex microscopic  Result Value Ref Range   Color, Urine YELLOW YELLOW   APPearance CLEAR CLEAR   Specific Gravity, Urine 1.016 1.005 - 1.030   pH 8.0 5.0 - 8.0   Glucose, UA NEGATIVE NEGATIVE mg/dL   Hgb urine dipstick NEGATIVE NEGATIVE   Bilirubin Urine NEGATIVE NEGATIVE   Ketones, ur NEGATIVE NEGATIVE mg/dL   Protein, ur 30 (A) NEGATIVE mg/dL   Nitrite NEGATIVE NEGATIVE   Leukocytes,Ua NEGATIVE NEGATIVE   RBC / HPF 0-5 0 - 5 RBC/hpf   WBC, UA 0-5 0 - 5 WBC/hpf   Bacteria, UA NONE SEEN NONE SEEN   Squamous Epithelial / LPF 6-10 0 - 5   Mucus PRESENT   Troponin I (High Sensitivity)  Result Value Ref Range   Troponin I (High Sensitivity) 4 <18 ng/L  Troponin I (High Sensitivity)  Result Value Ref Range   Troponin I (High Sensitivity) 4 <18 ng/L   DG Chest 2 View  Result Date: 07/03/2020 CLINICAL DATA:  Chest and left arm pain. EXAM:  CHEST - 2 VIEW COMPARISON:  01/30/2015 FINDINGS: The cardiac silhouette, mediastinal and hilar contours are normal. The lungs are clear. No pleural effusion, pneumothorax or pulmonary lesions. The bony thorax is intact. IMPRESSION: No acute cardiopulmonary findings. Electronically Signed   By: Rudie Meyer M.D.   On: 07/03/2020 05:49   CT Angio Chest PE W and/or Wo Contrast  Result Date: 07/03/2020 CLINICAL DATA:  PE suspected, high probability, with abdominal pain. EXAM: CT ANGIOGRAPHY CHEST CT ABDOMEN AND PELVIS WITH CONTRAST TECHNIQUE: Multidetector CT imaging of the chest was performed using the standard protocol during bolus administration of intravenous contrast. Multiplanar CT image reconstructions and MIPs were obtained to evaluate the vascular anatomy. Multidetector CT imaging of the abdomen and pelvis was performed using the standard protocol during bolus administration of intravenous contrast. CONTRAST:  OMNIPAQUE IOHEXOL 350 MG/ML SOLN COMPARISON:  Radiograph 07/03/2020 FINDINGS: CTA CHEST FINDINGS Cardiovascular: Satisfactory opacification the pulmonary arteries to the segmental level. No pulmonary artery filling defects are identified. Central pulmonary arteries are normal caliber. Normal heart size. No pericardial effusion. The aorta is normal caliber. No acute aortic abnormality is evident. Shared origin of brachiocephalic and left common carotid artery. No major venous abnormality. Mediastinum/Nodes: No mediastinal fluid or gas. Normal thyroid gland and thoracic inlet. No acute abnormality of the trachea. Small hiatal hernia. No acute esophageal abnormality. No worrisome mediastinal, hilar or axillary adenopathy. Lungs/Pleura: No consolidation, features of edema, pneumothorax, or effusion. No suspicious pulmonary nodules or masses. Minimal atelectatic changes dependently. Musculoskeletal: No chest wall abnormality. No acute or significant osseous findings. Review of the MIP images  confirms the above findings. CT ABDOMEN and PELVIS FINDINGS Hepatobiliary: Diffuse hepatic hypoattenuation compatible with hepatic steatosis. Sparing along the gallbladder fossa. Smooth liver surface contour. No visible worrisome liver lesions. Normal gallbladder and biliary tree without visible calcified gallstone. Pancreas: Unremarkable. No pancreatic ductal dilatation or surrounding inflammatory changes. Spleen: Normal in size. No concerning splenic lesions. Adrenals/Urinary Tract: Normal adrenal glands. Kidneys enhance and excrete symmetrically and uniformly. Small amount of cortical scarring in the right upper pole or concerning renal mass, calculus or hydronephrosis. Urinary bladder is physiologically distended and free of acute  worrisome bladder abnormality. Stomach/Bowel: Small hiatal hernia, as above. Distal stomach and duodenum are unremarkable. No small bowel thickening or dilatation. A normal appendix is visualized. No colonic dilatation or wall thickening. Vascular/Lymphatic: No significant vascular findings are present. No enlarged abdominal or pelvic lymph nodes. Reproductive: Uterus is surgically absent. No concerning adnexal lesions. Other: No abdominopelvic free fluid or free gas. No bowel containing hernias. Musculoskeletal: Asymmetric sclerotic changes of the right ilium abutting the SI joint. Could reflect sequela of prior sacroiliitis or a more benign process such as asymmetric osteitis condensans ilii. No other acute or suspicious osseous abnormalities. Review of the MIP images confirms the above findings. IMPRESSION: 1. No evidence of pulmonary embolism. No acute intrathoracic process. 2. No acute intra-abdominal process. 3. Hepatic steatosis. 4. Asymmetric sclerotic changes of the right ilium abutting the SI joint, which may reflect sequela of prior sacroiliitis such is a psoriatic or reactive arthritis versus a more benign process such as asymmetric osteitis condensans ilii. Correlate with  clinical history. Electronically Signed   By: Kreg Shropshire M.D.   On: 07/03/2020 17:00   CT ABDOMEN PELVIS W CONTRAST  Result Date: 07/03/2020 CLINICAL DATA:  PE suspected, high probability, with abdominal pain. EXAM: CT ANGIOGRAPHY CHEST CT ABDOMEN AND PELVIS WITH CONTRAST TECHNIQUE: Multidetector CT imaging of the chest was performed using the standard protocol during bolus administration of intravenous contrast. Multiplanar CT image reconstructions and MIPs were obtained to evaluate the vascular anatomy. Multidetector CT imaging of the abdomen and pelvis was performed using the standard protocol during bolus administration of intravenous contrast. CONTRAST:  OMNIPAQUE IOHEXOL 350 MG/ML SOLN COMPARISON:  Radiograph 07/03/2020 FINDINGS: CTA CHEST FINDINGS Cardiovascular: Satisfactory opacification the pulmonary arteries to the segmental level. No pulmonary artery filling defects are identified. Central pulmonary arteries are normal caliber. Normal heart size. No pericardial effusion. The aorta is normal caliber. No acute aortic abnormality is evident. Shared origin of brachiocephalic and left common carotid artery. No major venous abnormality. Mediastinum/Nodes: No mediastinal fluid or gas. Normal thyroid gland and thoracic inlet. No acute abnormality of the trachea. Small hiatal hernia. No acute esophageal abnormality. No worrisome mediastinal, hilar or axillary adenopathy. Lungs/Pleura: No consolidation, features of edema, pneumothorax, or effusion. No suspicious pulmonary nodules or masses. Minimal atelectatic changes dependently. Musculoskeletal: No chest wall abnormality. No acute or significant osseous findings. Review of the MIP images confirms the above findings. CT ABDOMEN and PELVIS FINDINGS Hepatobiliary: Diffuse hepatic hypoattenuation compatible with hepatic steatosis. Sparing along the gallbladder fossa. Smooth liver surface contour. No visible worrisome liver lesions. Normal gallbladder and  biliary tree without visible calcified gallstone. Pancreas: Unremarkable. No pancreatic ductal dilatation or surrounding inflammatory changes. Spleen: Normal in size. No concerning splenic lesions. Adrenals/Urinary Tract: Normal adrenal glands. Kidneys enhance and excrete symmetrically and uniformly. Small amount of cortical scarring in the right upper pole or concerning renal mass, calculus or hydronephrosis. Urinary bladder is physiologically distended and free of acute worrisome bladder abnormality. Stomach/Bowel: Small hiatal hernia, as above. Distal stomach and duodenum are unremarkable. No small bowel thickening or dilatation. A normal appendix is visualized. No colonic dilatation or wall thickening. Vascular/Lymphatic: No significant vascular findings are present. No enlarged abdominal or pelvic lymph nodes. Reproductive: Uterus is surgically absent. No concerning adnexal lesions. Other: No abdominopelvic free fluid or free gas. No bowel containing hernias. Musculoskeletal: Asymmetric sclerotic changes of the right ilium abutting the SI joint. Could reflect sequela of prior sacroiliitis or a more benign process such as asymmetric osteitis condensans ilii. No  other acute or suspicious osseous abnormalities. Review of the MIP images confirms the above findings. IMPRESSION: 1. No evidence of pulmonary embolism. No acute intrathoracic process. 2. No acute intra-abdominal process. 3. Hepatic steatosis. 4. Asymmetric sclerotic changes of the right ilium abutting the SI joint, which may reflect sequela of prior sacroiliitis such is a psoriatic or reactive arthritis versus a more benign process such as asymmetric osteitis condensans ilii. Correlate with clinical history. Electronically Signed   By: Kreg Shropshire M.D.   On: 07/03/2020 17:00    ED Course/Procedures     Procedures  MDM  CTA chest and CT ap are negative. Provide pt with pepcid, and PO maalox carafate for suspected GERD. Pt w slight improvement.  Will prescribe omeprazole and isntructed of close f/u with PCP. Strict return precautions. Pt agreeable with plan, appropriate for discharge.       Mekayla Soman, [place] N, PA-C 07/03/20 1914    Arby Barrette, MD 07/10/20 1423

## 2020-07-03 NOTE — Discharge Instructions (Signed)
Please read instructions below. Drink clear liquids until your stomach feels better. Then, slowly introduce bland foods into your diet as tolerated.  Avoid spicy, greasy, acidic foods as this can worsen your symptoms.  Avoid NSAID medications such as Advil/ibuprofen/Motrin, Aleve, aspirin, Goody's powder, BC powder.  Remain is sitting upright for at least 45 minutes after meals. Take the carafate before meals and at bedtime to help with stomach upset. Take the omeprazole daily. You can get this over-the-counter after your prescription runs out. Follow up with your primary care provider. Return to the ER for severely worsening abdominal pain, severely worsening chest pain or chest pain with physical exertion, uncontrollable vomiting, or vomiting blood.

## 2020-07-03 NOTE — ED Triage Notes (Signed)
Pt presents to ED POV. Pt c/o central CP that radiates to L arm. Pt reports that pain began at rest. Pt states that after CP began she vomited x2.

## 2020-07-03 NOTE — ED Provider Notes (Signed)
[facility] EMERGENCY DEPARTMENT Provider Note   CSN: [id number] Arrival date & time: 07/03/20  1062     History Chief Complaint  Patient presents with  . Chest Pain    [name] [name] is a 49 y.o. female.  HPI   49 year old female with history of chronic headache, hypertension, PCOS, DM type II, GERD presents to the ER for persistent nausea, nonbloody, nonbilious vomiting, upper abdominal pain.  She reports having to use a max for dinner, no fevers or chills.  She does report some pain on inspiration, and has had recent travel to [place] by car.  She also reports left arm throbbing, no numbness or tingling, normal temperature.  No dysuria or hematuria, no hematemesis.  She has not had a bowel movement for several days.  No hematochezia.  She denies any drug or alcohol use.  Denies marijuana use.  No sick contacts.  She does have a history of diabetes and is on Trulicity, and states that her sugars have not been very well controlled.  She has her gallbladder.  She does endorse eating eczema especially in her, states that this does not feel like food poisoning.  She does have a history of GERD, but states that she normally does not have this much vomiting.  She has not been able to tolerate much water or food over the last day.  Past Medical History:  Diagnosis Date  . Anemia, iron deficiency   . Chronic headache    Dr Vela Prose, Neg MRI 2005  . Diabetes mellitus   . Elevated glucose 2011  . GERD (gastroesophageal reflux disease)   . HTN (hypertension)     Patient Active Problem List   Diagnosis Date Noted  . Gastroenteritis 06/24/2020  . Greater trochanteric bursitis of right hip 08/30/2019  . Leg wound, left 06/20/2019  . Bacterial vaginosis 04/12/2019  . Pruritus of vulva 04/12/2019  . Callus of foot 12/13/2018  . Bursitis of right shoulder 09/01/2017  . Dyslipidemia 08/17/2017  . Shoulder pain, right 08/17/2017  . Mild intermittent asthma with acute  exacerbation 08/04/2017  . RTI (respiratory tract infection) 08/04/2017  . Dysuria 03/16/2017  . Tick bite of back 03/16/2017  . Multinodular goiter 11/03/2016  . Wellness examination 11/03/2016  . Ear itching 08/21/2016  . Goiter 03/07/2014  . Low back pain 10/13/2012  . Herpes zoster 10/13/2012  . Cough 04/20/2011  . Vitamin D deficiency 03/08/2011  . Diabetes type 2, controlled (HCC) 03/06/2011  . INSOMNIA, CHRONIC 07/28/2010  . OBESITY 06/24/2009  . GERD 03/11/2009  . ANEMIA-IRON DEFICIENCY 10/19/2007  . Essential hypertension 10/19/2007    Past Surgical History:  Procedure Laterality Date  . ABDOMINAL HYSTERECTOMY    . BREAST REDUCTION SURGERY    . Cyst removed from vagina    . INDUCED ABORTION  1990-1994   x 4  . NOSE SURGERY    . PARTIAL HYSTERECTOMY  2010  . TUBAL LIGATION       OB History   No obstetric history on file.     Family History  Problem Relation Age of Onset  . Hypertension Mother   . Esophageal cancer Mother 3  . Healthy Father   . Hypertension Maternal Grandmother   . Stroke Maternal Grandmother   . Ovarian cancer Sister   . Prostate cancer Maternal Grandfather     Social History   Tobacco Use  . Smoking status: Never Smoker  . Smokeless tobacco: Never Used  Vaping Use  .  Vaping Use: Never used  Substance Use Topics  . Alcohol use: Yes    Alcohol/week: 0.0 standard drinks    Comment: occasionally 2 per week  . Drug use: No    Home Medications Prior to Admission medications   Medication Sig Start Date End Date Taking? Authorizing Provider  calcium carbonate (TUMS - DOSED IN MG ELEMENTAL CALCIUM) 500 MG chewable tablet Chew 1 tablet by mouth 3 (three) times daily as needed for indigestion or heartburn.   Yes [provider]  ibuprofen (ADVIL) 200 MG tablet Take 400 mg by mouth every 6 (six) hours as needed for fever, headache or moderate pain.   Yes [provider]  Olmesartan-amLODIPine-HCTZ 40-10-25 MG TABS  Take 1 tablet by mouth daily. 06/24/20  Yes Myrlene Broker, MD  TRULICITY 1.5 MG/0.5ML SOPN Inject 0.5 mLs into the skin once a week. 06/18/20  Yes [provider]  Vitamin D, Ergocalciferol, (DRISDOL) 1.25 MG (50000 UT) CAPS capsule Take 1 capsule (50,000 Units total) by mouth every 7 (seven) days. 08/30/19  Yes Judi Saa, DO  budesonide-formoterol (SYMBICORT) 160-4.5 MCG/ACT inhaler Inhale 2 puffs into the lungs 2 (two) times daily. Patient not taking: Reported on 06/24/2020 04/06/19 04/05/20  Plotnikov, Georgina Quint, MD  Dulaglutide (TRULICITY) 3 MG/0.5ML SOPN Inject 3 mg into the skin once a week. Patient not taking: Reported on 07/03/2020 04/05/20   Romero Belling, MD  empagliflozin (JARDIANCE) 25 MG TABS tablet Take 25 mg by mouth daily. Patient not taking: Reported on 06/24/2020 06/23/19   Romero Belling, MD  fluticasone [facility]) 50 MCG/ACT nasal spray Place 2 sprays into both nostrils daily. Patient not taking: Reported on 06/24/2020 10/22/16   Tyrone Nine, MD  glucose blood [facility] VERIO) test strip 1 each by Other route daily. Use as instructed 10/23/19   Romero Belling, MD  meloxicam (MOBIC) 15 MG tablet Take 1 tablet (15 mg total) by mouth daily. Patient not taking: Reported on 07/03/2020 05/27/20   Olive Bass, FNP  metFORMIN (GLUCOPHAGE) 500 MG tablet Take 2 tablets (1,000 mg total) by mouth 2 (two) times daily with a meal. Patient not taking: Reported on 06/24/2020 07/28/16   Plotnikov, Georgina Quint, MD  methocarbamol (ROBAXIN) 500 MG tablet Take 1 tablet (500 mg total) by mouth every 8 (eight) hours as needed for muscle spasms. Patient not taking: Reported on 06/24/2020 05/27/20   Olive Bass, FNP  omeprazole (PRILOSEC) 20 MG capsule Take 1 capsule (20 mg total) by mouth daily. 07/03/20   Robinson, [place] N, PA-C  repaglinide (PRANDIN) 2 MG tablet Take 1 tablet (2 mg total) by mouth 3 (three) times daily before meals. Patient not taking: Reported on 06/24/2020  08/17/18   Romero Belling, MD  sucralfate (CARAFATE) 1 GM/10ML suspension Take 10 mLs (1 g total) by mouth 4 (four) times daily -  with meals and at bedtime. 07/03/20   Robinson, [place] N, PA-C    Allergies    Ciprofloxacin and Tramadol  Review of Systems   Review of Systems  Constitutional: Negative for chills and fever.  HENT: Negative for ear pain and sore throat.   Eyes: Negative for pain and visual disturbance.  Respiratory: Negative for cough and shortness of breath.   Cardiovascular: Negative for chest pain and palpitations.  Gastrointestinal: Positive for abdominal pain, nausea and vomiting.  Genitourinary: Negative for dysuria and hematuria.  Musculoskeletal: Negative for arthralgias and back pain.  Skin: Negative for color change and rash.  Neurological: Negative for seizures  and syncope.  All other systems reviewed and are negative.   Physical Exam Updated Vital Signs BP (!) 170/104   Pulse 84   Temp 98.5 F (36.9 C)   Resp 17   SpO2 97%   Physical Exam Vitals reviewed.  Constitutional:      General: She is not in acute distress.    Appearance: Normal appearance. She is well-developed. She is obese. She is not ill-appearing, toxic-appearing or diaphoretic.     Comments: Patient actively vomiting into a bag on room my arrival into the room.  Emesis nonbloody, nonbilious  HENT:     Head: Normocephalic and atraumatic.  Eyes:     General:        Right eye: No discharge.        Left eye: No discharge.     Extraocular Movements: Extraocular movements intact.     Conjunctiva/sclera: Conjunctivae normal.  Cardiovascular:     Rate and Rhythm: Normal rate and regular rhythm.     Pulses:          Radial pulses are 2+ on the right side and 2+ on the left side.     Heart sounds: Normal heart sounds.  Pulmonary:     Effort: Pulmonary effort is normal.     Breath sounds: Normal breath sounds.  Abdominal:     General: Bowel sounds are normal.     Palpations: Abdomen is  soft.     Tenderness: There is abdominal tenderness in the right upper quadrant, epigastric area and left upper quadrant. There is no right CVA tenderness or left CVA tenderness. Negative signs include Murphy's sign and McBurney's sign.  Musculoskeletal:        General: No swelling. Normal range of motion.  Neurological:     General: No focal deficit present.     Mental Status: She is alert and oriented to person, place, and time.  Psychiatric:        Mood and Affect: Mood normal.        Behavior: Behavior normal.     ED Results / Procedures / Treatments   Labs (all labs ordered are listed, but only abnormal results are displayed) Labs Reviewed  BASIC METABOLIC PANEL - Abnormal; Notable for the following components:      Result Value   Glucose, Bld 216 (*)    Creatinine, Ser 1.05 (*)    All other components within normal limits  CBC - Abnormal; Notable for the following components:   WBC 12.3 (*)    RBC 5.36 (*)    Hemoglobin 15.5 (*)    HCT 46.5 (*)    All other components within normal limits  D-DIMER, QUANTITATIVE (NOT AT [facility]) - Abnormal; Notable for the following components:   D-Dimer, Quant 0.57 (*)    All other components within normal limits  URINALYSIS, ROUTINE W REFLEX MICROSCOPIC - Abnormal; Notable for the following components:   Protein, ur 30 (*)    All other components within normal limits  SARS CORONAVIRUS 2 BY RT PCR (HOSPITAL ORDER, PERFORMED IN [facility])  HEPATIC FUNCTION PANEL  I-STAT BETA HCG BLOOD, ED ([facility], [facility], [facility] ONLY)  TROPONIN I (HIGH SENSITIVITY)  TROPONIN I (HIGH SENSITIVITY)    EKG EKG Interpretation  Date/Time:  Wednesday July 03 2020 04:42:15 EDT Ventricular Rate:  109 PR Interval:  152 QRS Duration: 74 QT Interval:  310 QTC Calculation: 417 R Axis:   -37 Text Interpretation: Sinus tachycardia Left axis deviation Nonspecific ST  abnormality Abnormal ECG When compared with ECG of 12/20/2014, Left axis deviation is now present  Confirmed by Dione BoozeGlick, David ([id number]) on 07/03/2020 4:47:50 AM   Radiology DG Chest 2 View  Result Date: 07/03/2020 CLINICAL DATA:  Chest and left arm pain. EXAM: CHEST - 2 VIEW COMPARISON:  01/30/2015 FINDINGS: The cardiac silhouette, mediastinal and hilar contours are normal. The lungs are clear. No pleural effusion, pneumothorax or pulmonary lesions. The bony thorax is intact. IMPRESSION: No acute cardiopulmonary findings. Electronically Signed   By: Rudie MeyerP.  Gallerani M.D.   On: 07/03/2020 05:49   CT Angio Chest PE W and/or Wo Contrast  Result Date: 07/03/2020 CLINICAL DATA:  PE suspected, high probability, with abdominal pain. EXAM: CT ANGIOGRAPHY CHEST CT ABDOMEN AND PELVIS WITH CONTRAST TECHNIQUE: Multidetector CT imaging of the chest was performed using the standard protocol during bolus administration of intravenous contrast. Multiplanar CT image reconstructions and MIPs were obtained to evaluate the vascular anatomy. Multidetector CT imaging of the abdomen and pelvis was performed using the standard protocol during bolus administration of intravenous contrast. CONTRAST:  100mL OMNIPAQUE IOHEXOL 350 MG/ML SOLN COMPARISON:  Radiograph 07/03/2020 FINDINGS: CTA CHEST FINDINGS Cardiovascular: Satisfactory opacification the pulmonary arteries to the segmental level. No pulmonary artery filling defects are identified. Central pulmonary arteries are normal caliber. Normal heart size. No pericardial effusion. The aorta is normal caliber. No acute aortic abnormality is evident. Shared origin of brachiocephalic and left common carotid artery. No major venous abnormality. Mediastinum/Nodes: No mediastinal fluid or gas. Normal thyroid gland and thoracic inlet. No acute abnormality of the trachea. Small hiatal hernia. No acute esophageal abnormality. No worrisome mediastinal, hilar or axillary adenopathy. Lungs/Pleura: No consolidation, features of edema, pneumothorax, or effusion. No suspicious pulmonary nodules or  masses. Minimal atelectatic changes dependently. Musculoskeletal: No chest wall abnormality. No acute or significant osseous findings. Review of the MIP images confirms the above findings. CT ABDOMEN and PELVIS FINDINGS Hepatobiliary: Diffuse hepatic hypoattenuation compatible with hepatic steatosis. Sparing along the gallbladder fossa. Smooth liver surface contour. No visible worrisome liver lesions. Normal gallbladder and biliary tree without visible calcified gallstone. Pancreas: Unremarkable. No pancreatic ductal dilatation or surrounding inflammatory changes. Spleen: Normal in size. No concerning splenic lesions. Adrenals/Urinary Tract: Normal adrenal glands. Kidneys enhance and excrete symmetrically and uniformly. Small amount of cortical scarring in the right upper pole or concerning renal mass, calculus or hydronephrosis. Urinary bladder is physiologically distended and free of acute worrisome bladder abnormality. Stomach/Bowel: Small hiatal hernia, as above. Distal stomach and duodenum are unremarkable. No small bowel thickening or dilatation. A normal appendix is visualized. No colonic dilatation or wall thickening. Vascular/Lymphatic: No significant vascular findings are present. No enlarged abdominal or pelvic lymph nodes. Reproductive: Uterus is surgically absent. No concerning adnexal lesions. Other: No abdominopelvic free fluid or free gas. No bowel containing hernias. Musculoskeletal: Asymmetric sclerotic changes of the right ilium abutting the SI joint. Could reflect sequela of prior sacroiliitis or a more benign process such as asymmetric osteitis condensans ilii. No other acute or suspicious osseous abnormalities. Review of the MIP images confirms the above findings. IMPRESSION: 1. No evidence of pulmonary embolism. No acute intrathoracic process. 2. No acute intra-abdominal process. 3. Hepatic steatosis. 4. Asymmetric sclerotic changes of the right ilium abutting the SI joint, which may reflect  sequela of prior sacroiliitis such is a psoriatic or reactive arthritis versus a more benign process such as asymmetric osteitis condensans ilii. Correlate with clinical history. Electronically Signed   By: Coralie KeensPrice  DeHay M.D.  On: 07/03/2020 17:00   CT ABDOMEN PELVIS W CONTRAST  Result Date: 07/03/2020 CLINICAL DATA:  PE suspected, high probability, with abdominal pain. EXAM: CT ANGIOGRAPHY CHEST CT ABDOMEN AND PELVIS WITH CONTRAST TECHNIQUE: Multidetector CT imaging of the chest was performed using the standard protocol during bolus administration of intravenous contrast. Multiplanar CT image reconstructions and MIPs were obtained to evaluate the vascular anatomy. Multidetector CT imaging of the abdomen and pelvis was performed using the standard protocol during bolus administration of intravenous contrast. CONTRAST:  OMNIPAQUE IOHEXOL 350 MG/ML SOLN COMPARISON:  Radiograph 07/03/2020 FINDINGS: CTA CHEST FINDINGS Cardiovascular: Satisfactory opacification the pulmonary arteries to the segmental level. No pulmonary artery filling defects are identified. Central pulmonary arteries are normal caliber. Normal heart size. No pericardial effusion. The aorta is normal caliber. No acute aortic abnormality is evident. Shared origin of brachiocephalic and left common carotid artery. No major venous abnormality. Mediastinum/Nodes: No mediastinal fluid or gas. Normal thyroid gland and thoracic inlet. No acute abnormality of the trachea. Small hiatal hernia. No acute esophageal abnormality. No worrisome mediastinal, hilar or axillary adenopathy. Lungs/Pleura: No consolidation, features of edema, pneumothorax, or effusion. No suspicious pulmonary nodules or masses. Minimal atelectatic changes dependently. Musculoskeletal: No chest wall abnormality. No acute or significant osseous findings. Review of the MIP images confirms the above findings. CT ABDOMEN and PELVIS FINDINGS Hepatobiliary: Diffuse hepatic hypoattenuation  compatible with hepatic steatosis. Sparing along the gallbladder fossa. Smooth liver surface contour. No visible worrisome liver lesions. Normal gallbladder and biliary tree without visible calcified gallstone. Pancreas: Unremarkable. No pancreatic ductal dilatation or surrounding inflammatory changes. Spleen: Normal in size. No concerning splenic lesions. Adrenals/Urinary Tract: Normal adrenal glands. Kidneys enhance and excrete symmetrically and uniformly. Small amount of cortical scarring in the right upper pole or concerning renal mass, calculus or hydronephrosis. Urinary bladder is physiologically distended and free of acute worrisome bladder abnormality. Stomach/Bowel: Small hiatal hernia, as above. Distal stomach and duodenum are unremarkable. No small bowel thickening or dilatation. A normal appendix is visualized. No colonic dilatation or wall thickening. Vascular/Lymphatic: No significant vascular findings are present. No enlarged abdominal or pelvic lymph nodes. Reproductive: Uterus is surgically absent. No concerning adnexal lesions. Other: No abdominopelvic free fluid or free gas. No bowel containing hernias. Musculoskeletal: Asymmetric sclerotic changes of the right ilium abutting the SI joint. Could reflect sequela of prior sacroiliitis or a more benign process such as asymmetric osteitis condensans ilii. No other acute or suspicious osseous abnormalities. Review of the MIP images confirms the above findings. IMPRESSION: 1. No evidence of pulmonary embolism. No acute intrathoracic process. 2. No acute intra-abdominal process. 3. Hepatic steatosis. 4. Asymmetric sclerotic changes of the right ilium abutting the SI joint, which may reflect sequela of prior sacroiliitis such is a psoriatic or reactive arthritis versus a more benign process such as asymmetric osteitis condensans ilii. Correlate with clinical history. Electronically Signed   By: Kreg Shropshire M.D.   On: 07/03/2020 17:00     Procedures Procedures (including critical care time)  Medications Ordered in ED Medications  ondansetron (ZOFRAN) injection 4 mg (4 mg Intravenous Given 07/03/20 1123)  sodium chloride 0.9 % bolus 1,000 mL (0 mLs Intravenous Stopped 07/03/20 1251)  alum & mag hydroxide-simeth (MAALOX/MYLANTA) 200-200-20 MG/5ML suspension 30 mL (30 mLs Oral Given 07/03/20 1548)  famotidine (PEPCID) IVPB 20 mg premix (0 mg Intravenous Stopped 07/03/20 1629)  iohexol (OMNIPAQUE) 350 MG/ML injection 100 mL (100 mLs Intravenous Contrast Given 07/03/20 1624)  sucralfate (CARAFATE) 1 GM/10ML suspension 1 g (  1 g Oral Given 07/03/20 1816)    ED Course  I have reviewed the triage vital signs and the nursing notes.  Pertinent labs & imaging results that were available during my care of the patient were reviewed by me and considered in my medical decision making (see chart for details).    MDM Rules/Calculators/A&P                         Patient here with nausea, vomiting, abdominal pain, actively vomiting here in the ER. She is overall well-appearing, vitals are reassuring she is consistently hypertensive with a blood pressure of 170/104.  No signs of hypertensive urgency/emergency.  She states that she did not take her medications this morning due to the vomiting.  She is afebrile.  She has no lower abdominal tenderness, but she does have some fairly significant upper, and epigastric abdominal tenderness.  Negative Murphy sign.  CBC with mild leukocytosis of 12.4, hemoglobin 15.5.  BMP without any significant electrolyte abnormalities, glucose of 216, and creatinine mildly increased at 1.05.  She does have a negative gap and bicarb.  Doubt DKA.  Given recent travel, and pain on inspiration, I ordered a D-dimer which was elevated at 0.57.  Delta troponin negative.  EKG with sinus tach, and new left axis deviation.  Hepatic function panel with normal liver function test.  UA without evidence of UTI.  Negative Covid.   Given abdominal tenderness, positive D-dimer, I ordered a CT for the patient.  Chest x-ray was without evidence of cardiopulmonary abnormality.  Signed out care to [facility], she will oversee the CT abdomen and angio results.  I suspect that this will be normal, and she will be stable for discharge.  Final Clinical Impression(s) / ED Diagnoses Final diagnoses:  Central chest pain  Gastroesophageal reflux disease, unspecified whether esophagitis present    Rx / DC Orders ED Discharge Orders         Ordered    sucralfate (CARAFATE) 1 GM/10ML suspension  3 times daily with meals & bedtime     Discontinue  Reprint     07/03/20 1822    omeprazole (PRILOSEC) 20 MG capsule  Daily     Discontinue  Reprint     07/03/20 1822           Mare Ferrari, PA-C 07/04/20 1610    Arby Barrette, MD 07/10/20 1424

## 2020-07-03 NOTE — ED Notes (Signed)
Pt ambulated to the bathroom with a steady gait.

## 2020-07-04 ENCOUNTER — Encounter: Payer: Self-pay | Admitting: Internal Medicine

## 2020-07-04 ENCOUNTER — Telehealth: Payer: Self-pay

## 2020-07-04 NOTE — Telephone Encounter (Signed)
New message    Pt c/o medication issue:  1. Name of Medication: Olmesartan-amLODIPine-HCTZ 40-10-25 MG TABS  2. How are you currently taking this medication (dosage and times per day)? daily  3. Are you having a reaction (difficulty breathing--STAT)? No   4. What is your medication issue? Patient decide to stay with 40-10-12.5 mg   5. [phone] - Rx Pharmacy

## 2020-07-04 NOTE — Telephone Encounter (Signed)
Pt had OV with Dr. Okey Dupre 06/24/2020 and her Olmesartan- amlodipine was increased from 40-10-12.5 MG to 40-10-25 MG, Pt wants to stay on 40-10-12.5 MG

## 2020-07-05 ENCOUNTER — Ambulatory Visit: Payer: 59 | Admitting: Endocrinology

## 2020-07-05 NOTE — Telephone Encounter (Signed)
Okay to stay on the old dose of olmesartan/amlodipine/HCT if blood pressure is good. Thanks

## 2020-07-08 NOTE — Telephone Encounter (Signed)
Left pt a detailed message of below  

## 2020-12-09 ENCOUNTER — Telehealth (INDEPENDENT_AMBULATORY_CARE_PROVIDER_SITE_OTHER): Payer: 59 | Admitting: Medical

## 2020-12-09 ENCOUNTER — Other Ambulatory Visit: Payer: Self-pay

## 2020-12-09 DIAGNOSIS — R059 Cough, unspecified: Secondary | ICD-10-CM

## 2020-12-09 DIAGNOSIS — J029 Acute pharyngitis, unspecified: Secondary | ICD-10-CM | POA: Diagnosis not present

## 2020-12-09 DIAGNOSIS — Z20822 Contact with and (suspected) exposure to covid-19: Secondary | ICD-10-CM

## 2020-12-09 MED ORDER — AZITHROMYCIN 250 MG PO TABS
ORAL_TABLET | ORAL | 0 refills | Status: DC
Start: 2020-12-09 — End: 2021-08-14

## 2020-12-09 MED ORDER — BENZONATATE 100 MG PO CAPS: 100.0000 mg | ORAL_CAPSULE | Freq: Three times a day (TID) | ORAL | 0 refills | Status: DC | PRN

## 2020-12-09 NOTE — Progress Notes (Signed)
   Subjective:    Patient ID: [name] [name], female    DOB: [birth year], 49 y.o.   MRN: [id number]  HPI  Virtual Visit via Video Note  I connected with [name] [name] on 12/09/20 at  4:00 PM EST by a video enabled telemedicine application and verified that I am speaking with the correct person using two identifiers.  Location: Patient: home Provider: office  Pt did not check her vitals today.    I discussed the limitations of evaluation and management by telemedicine and the availability of in person appointments. The patient expressed understanding and agreed to proceed.  History of Present Illness: Pt has one day of st. Some pain in submandibular areas. Pt states burning sensation in throat. Her grandson tested positive for strep last week and daughter has covid. Pt has been vaccinated in march and April. Pt got phizer.   Pt does not have bodyaches. Pt has no fever or chills. No nasal congestion.  Pt does have random cough. No wheezing or sob.     Observations/Objective:  General-no acute distress, pleasant, oriented. Lungs- on inspection lungs appear unlabored. Neck- no tracheal deviation or jvd on inspection. Neuro- gross motor function appears intact. Moderate tonsil hypertrophy on video inspection.  Mild redness.  Assessment and Plan: Recent st with dry cough.  He had both exposure to strep and Covid over the last week.  We will go ahead and prescribe you a azithromycin antibiotic and benzonatate cough tablets.  Do recommend that you get PCR Covid test.  You can get rapid test as well but under the circumstances would ask that you rely on the PCR test in regards to returning to work.  Also make sure that you her daughter stays quarantine in her room so you do not get exposure.  Follow-up in 7 days or as needed.  Time spent with patient today was 25  minutes which consisted of chart review, discussing diagnosis, work up treatment and documentation.  Follow Up  Instructions:    I discussed the assessment and treatment plan with the patient. The patient was provided an opportunity to ask questions and all were answered. The patient agreed with the plan and demonstrated an understanding of the instructions.   The patient was advised to call back or seek an in-person evaluation if the symptoms worsen or if the condition fails to improve as anticipated.     Esperanza Richters, PA-C   Review of Systems     Objective:   Physical Exam        Assessment & Plan:

## 2020-12-09 NOTE — Patient Instructions (Addendum)
Recent st with dry cough.  He had both exposure to strep and Covid over the last week.  We will go ahead and prescribe you a azithromycin antibiotic and benzonatate cough tablets.  Do recommend that you get PCR Covid test.  You can get rapid test as well but under the circumstances would ask that you rely on the PCR test in regards to returning to work.  Also make sure that you her daughter stays quarantine in her room so you do not get exposure.  Please notify us when you get the result of your PCR.  Follow-up in 7 days or as needed.

## 2021-03-05 LAB — HEMOGLOBIN A1C: Hemoglobin A1C: 11.9

## 2021-03-05 LAB — HM PAP SMEAR: HM Pap smear: NEGATIVE

## 2021-03-06 ENCOUNTER — Encounter: Payer: Self-pay | Admitting: Internal Medicine

## 2021-03-07 ENCOUNTER — Encounter: Payer: Self-pay | Admitting: Internal Medicine

## 2021-06-23 ENCOUNTER — Other Ambulatory Visit: Payer: Self-pay | Admitting: Endocrinology

## 2021-08-05 DIAGNOSIS — A6000 Herpesviral infection of urogenital system, unspecified: Secondary | ICD-10-CM | POA: Insufficient documentation

## 2021-08-14 ENCOUNTER — Encounter: Payer: Self-pay | Admitting: Internal Medicine

## 2021-08-14 ENCOUNTER — Ambulatory Visit: Payer: 59 | Admitting: Internal Medicine

## 2021-08-14 ENCOUNTER — Other Ambulatory Visit: Payer: Self-pay

## 2021-08-14 ENCOUNTER — Telehealth: Payer: Self-pay | Admitting: Internal Medicine

## 2021-08-14 DIAGNOSIS — I10 Essential (primary) hypertension: Secondary | ICD-10-CM

## 2021-08-14 DIAGNOSIS — E042 Nontoxic multinodular goiter: Secondary | ICD-10-CM | POA: Diagnosis not present

## 2021-08-14 DIAGNOSIS — R202 Paresthesia of skin: Secondary | ICD-10-CM

## 2021-08-14 DIAGNOSIS — E1169 Type 2 diabetes mellitus with other specified complication: Secondary | ICD-10-CM | POA: Diagnosis not present

## 2021-08-14 DIAGNOSIS — E559 Vitamin D deficiency, unspecified: Secondary | ICD-10-CM | POA: Diagnosis not present

## 2021-08-14 LAB — MICROALBUMIN / CREATININE URINE RATIO
Creatinine,U: 90.7 mg/dL
Microalb Creat Ratio: 14.3 mg/g (ref 0.0–30.0)
Microalb, Ur: 13 mg/dL — ABNORMAL HIGH (ref 0.0–1.9)

## 2021-08-14 LAB — URINALYSIS
Bilirubin Urine: NEGATIVE
Hgb urine dipstick: NEGATIVE
Ketones, ur: NEGATIVE
Leukocytes,Ua: NEGATIVE
Nitrite: NEGATIVE
Specific Gravity, Urine: 1.015 (ref 1.000–1.030)
Urine Glucose: NEGATIVE
Urobilinogen, UA: 0.2 (ref 0.0–1.0)
pH: 7 (ref 5.0–8.0)

## 2021-08-14 LAB — CBC WITH DIFFERENTIAL/PLATELET
Basophils Absolute: 0.1 10*3/uL (ref 0.0–0.1)
Basophils Relative: 1 % (ref 0.0–3.0)
Eosinophils Absolute: 0.2 10*3/uL (ref 0.0–0.7)
Eosinophils Relative: 1.7 % (ref 0.0–5.0)
HCT: 43.4 % (ref 36.0–46.0)
Hemoglobin: 14.2 g/dL (ref 12.0–15.0)
Lymphocytes Relative: 31.6 % (ref 12.0–46.0)
Lymphs Abs: 3.1 10*3/uL (ref 0.7–4.0)
MCHC: 32.7 g/dL (ref 30.0–36.0)
MCV: 87.2 fl (ref 78.0–100.0)
Monocytes Absolute: 0.8 10*3/uL (ref 0.1–1.0)
Monocytes Relative: 8.8 % (ref 3.0–12.0)
Neutro Abs: 5.5 10*3/uL (ref 1.4–7.7)
Neutrophils Relative %: 56.9 % (ref 43.0–77.0)
Platelets: 338 10*3/uL (ref 150.0–400.0)
RBC: 4.97 Mil/uL (ref 3.87–5.11)
RDW: 14.3 % (ref 11.5–15.5)
WBC: 9.7 10*3/uL (ref 4.0–10.5)

## 2021-08-14 LAB — TSH: TSH: 1.72 u[IU]/mL (ref 0.35–5.50)

## 2021-08-14 LAB — COMPREHENSIVE METABOLIC PANEL
ALT: 20 U/L (ref 0–35)
AST: 20 U/L (ref 0–37)
Albumin: 4.1 g/dL (ref 3.5–5.2)
Alkaline Phosphatase: 71 U/L (ref 39–117)
BUN: 18 mg/dL (ref 6–23)
CO2: 31 mEq/L (ref 19–32)
Calcium: 9.2 mg/dL (ref 8.4–10.5)
Chloride: 101 mEq/L (ref 96–112)
Creatinine, Ser: 1.07 mg/dL (ref 0.40–1.20)
GFR: 60.5 mL/min (ref >60.00)
Glucose, Bld: 135 mg/dL — ABNORMAL HIGH (ref 70–99)
Potassium: 3.9 mEq/L (ref 3.5–5.1)
Sodium: 138 mEq/L (ref 135–145)
Total Bilirubin: 0.5 mg/dL (ref 0.2–1.2)
Total Protein: 7.6 g/dL (ref 6.0–8.3)

## 2021-08-14 LAB — LIPID PANEL
Cholesterol: 201 mg/dL — ABNORMAL HIGH (ref 0–200)
HDL: 49 mg/dL (ref >39.00)
LDL Cholesterol: 137 mg/dL — ABNORMAL HIGH (ref 0–99)
NonHDL: 152.43
Total CHOL/HDL Ratio: 4
Triglycerides: 75 mg/dL (ref 0.0–149.0)
VLDL: 15 mg/dL (ref 0.0–40.0)

## 2021-08-14 LAB — VITAMIN B12: Vitamin B-12: 677 pg/mL (ref 211–911)

## 2021-08-14 LAB — HEMOGLOBIN A1C: Hgb A1c MFr Bld: 9.5 % — ABNORMAL HIGH (ref 4.6–6.5)

## 2021-08-14 LAB — VITAMIN D 25 HYDROXY (VIT D DEFICIENCY, FRACTURES): VITD: 21.97 ng/mL — ABNORMAL LOW (ref 30.00–100.00)

## 2021-08-14 MED ORDER — REPAGLINIDE 2 MG PO TABS: 2.0000 mg | ORAL_TABLET | Freq: Three times a day (TID) | ORAL | 11 refills | Status: DC

## 2021-08-14 MED ORDER — EMPAGLIFLOZIN 25 MG PO TABS: 25.0000 mg | ORAL_TABLET | Freq: Every day | ORAL | 11 refills | Status: DC

## 2021-08-14 MED ORDER — TRULICITY 1.5 MG/0.5ML ~~LOC~~ SOAJ: 1.5000 mg | SUBCUTANEOUS | 11 refills | Status: DC

## 2021-08-14 MED ORDER — SUCRALFATE 1 GM/10ML PO SUSP: 1.0000 g | Freq: Three times a day (TID) | ORAL | 11 refills | Status: DC

## 2021-08-14 MED ORDER — OMEPRAZOLE 20 MG PO CPDR: 20.0000 mg | DELAYED_RELEASE_CAPSULE | Freq: Every day | ORAL | 11 refills | Status: DC

## 2021-08-14 MED ORDER — METFORMIN HCL 500 MG PO TABS: 1000.0000 mg | ORAL_TABLET | Freq: Two times a day (BID) | ORAL | 11 refills | Status: DC

## 2021-08-14 MED ORDER — TRULICITY 3 MG/0.5ML ~~LOC~~ SOAJ: 3.0000 mg | SUBCUTANEOUS | 3 refills | Status: DC

## 2021-08-14 MED ORDER — OLMESARTAN-AMLODIPINE-HCTZ 40-10-25 MG PO TABS: 1.0000 | ORAL_TABLET | Freq: Every day | ORAL | 11 refills | Status: DC

## 2021-08-14 NOTE — Progress Notes (Signed)
Subjective:  Patient ID: [name] [name], female    DOB: 1971/01/02  Age: 50 y.o. MRN: [id number]  CC: Tingling (Pt states she started having a tingling sensation in her back and hips. Now she is having it in her arms)   HPI [name] [name] presents for HTN and DM - out of meds x 2 wks F/u on asthma C/o tingling and numbness in all over since last week...  Outpatient Medications Prior to Visit  Medication Sig Dispense Refill   fluticasone (FLONASE) 50 MCG/ACT nasal spray Place 2 sprays into both nostrils daily. (Patient taking differently: Place 2 sprays into both nostrils as needed.) 16 g 0   glucose blood (ONETOUCH VERIO) test strip 1 each by Other route daily. Use as instructed 100 each 0   Olmesartan-amLODIPine-HCTZ 40-10-25 MG TABS Take 1 tablet by mouth daily. 30 tablet 0   omeprazole (PRILOSEC) 20 MG capsule Take 1 capsule (20 mg total) by mouth daily. 14 capsule 0   TRULICITY 3 MG/0.5ML SOPN INJECT 3 MG INTO THE SKIN ONCE A WEEK 2 mL 3   sucralfate (CARAFATE) 1 GM/10ML suspension Take 10 mLs (1 g total) by mouth 4 (four) times daily -  with meals and at bedtime. (Patient not taking: Reported on 08/14/2021) 420 mL 0   azithromycin (ZITHROMAX) 250 MG tablet Take 2 tablets by mouth on day 1, followed by 1 tablet by mouth daily for 4 days. 6 tablet 0   benzonatate (TESSALON) 100 MG capsule Take 1 capsule (100 mg total) by mouth 3 (three) times daily as needed for cough. 30 capsule 0   budesonide-formoterol (SYMBICORT) 160-4.5 MCG/ACT inhaler Inhale 2 puffs into the lungs 2 (two) times daily. (Patient not taking: Reported on 06/24/2020) 1 Inhaler 11   calcium carbonate (TUMS - DOSED IN MG ELEMENTAL CALCIUM) 500 MG chewable tablet Chew 1 tablet by mouth 3 (three) times daily as needed for indigestion or heartburn. (Patient not taking: Reported on 08/14/2021)     empagliflozin (JARDIANCE) 25 MG TABS tablet Take 25 mg by mouth daily. (Patient not taking: No sig reported) 90 tablet 3   ibuprofen  (ADVIL) 200 MG tablet Take 400 mg by mouth every 6 (six) hours as needed for fever, headache or moderate pain. (Patient not taking: Reported on 08/14/2021)     meloxicam (MOBIC) 15 MG tablet Take 1 tablet (15 mg total) by mouth daily. (Patient not taking: No sig reported) 30 tablet 0   metFORMIN (GLUCOPHAGE) 500 MG tablet Take 2 tablets (1,000 mg total) by mouth 2 (two) times daily with a meal. (Patient not taking: No sig reported) 120 tablet 11   methocarbamol (ROBAXIN) 500 MG tablet Take 1 tablet (500 mg total) by mouth every 8 (eight) hours as needed for muscle spasms. (Patient not taking: No sig reported) 30 tablet 0   repaglinide (PRANDIN) 2 MG tablet Take 1 tablet (2 mg total) by mouth 3 (three) times daily before meals. (Patient not taking: No sig reported) 90 tablet 11   TRULICITY 1.5 MG/0.5ML SOPN Inject 0.5 mLs into the skin once a week. (Patient not taking: Reported on 08/14/2021)     Vitamin D, Ergocalciferol, (DRISDOL) 1.25 MG (50000 UT) CAPS capsule Take 1 capsule (50,000 Units total) by mouth every 7 (seven) days. (Patient not taking: Reported on 08/14/2021) 12 capsule 0   No facility-administered medications prior to visit.    ROS: Review of Systems  Constitutional:  Positive for fatigue. Negative for activity change, appetite change, chills and  unexpected weight change.  HENT:  Negative for congestion, mouth sores and sinus pressure.   Eyes:  Negative for visual disturbance.  Respiratory:  Negative for cough and chest tightness.   Gastrointestinal:  Negative for abdominal pain and nausea.  Genitourinary:  Negative for difficulty urinating, frequency and vaginal pain.  Musculoskeletal:  Negative for back pain and gait problem.  Skin:  Negative for pallor and rash.  Neurological:  Positive for numbness. Negative for dizziness, tremors, weakness and headaches.  Psychiatric/Behavioral:  Negative for confusion, sleep disturbance and suicidal ideas.    Objective:  BP (!) 162/110 (BP  Location: Right Arm)   Pulse 82   Temp 98.2 F (36.8 C) (Oral)   Ht 5\' 3"  (1.6 m)   Wt 225 lb (102.1 kg)   SpO2 99%   BMI 39.86 kg/m   BP Readings from Last 3 Encounters:  08/14/21 (!) 162/110  07/03/20 (!) 170/104  06/24/20 136/88    Wt Readings from Last 3 Encounters:  08/14/21 225 lb (102.1 kg)  06/24/20 219 lb (99.3 kg)  05/27/20 220 lb (99.8 kg)    Physical Exam Constitutional:      General: She is not in acute distress.    Appearance: She is well-developed. She is obese.  HENT:     Head: Normocephalic.     Right Ear: External ear normal.     Left Ear: External ear normal.     Nose: Nose normal.  Eyes:     General:        Right eye: No discharge.        Left eye: No discharge.     Conjunctiva/sclera: Conjunctivae normal.     Pupils: Pupils are equal, round, and reactive to light.  Neck:     Thyroid: No thyromegaly.     Vascular: No JVD.     Trachea: No tracheal deviation.  Cardiovascular:     Rate and Rhythm: Normal rate and regular rhythm.     Heart sounds: Normal heart sounds.  Pulmonary:     Effort: No respiratory distress.     Breath sounds: No stridor. No wheezing.  Abdominal:     General: Bowel sounds are normal. There is no distension.     Palpations: Abdomen is soft. There is no mass.     Tenderness: There is no abdominal tenderness. There is no guarding or rebound.  Musculoskeletal:        General: No tenderness.     Cervical back: Normal range of motion and neck supple. No rigidity.  Lymphadenopathy:     Cervical: No cervical adenopathy.  Skin:    Findings: No erythema or rash.  Neurological:     Cranial Nerves: No cranial nerve deficit.     Motor: No abnormal muscle tone.     Coordination: Coordination normal.     Deep Tendon Reflexes: Reflexes normal.  Psychiatric:        Behavior: Behavior normal.        Thought Content: Thought content normal.        Judgment: Judgment normal.  Looks tired  Lab Results  Component Value Date    WBC 12.3 (H) 07/03/2020   HGB 15.5 (H) 07/03/2020   HCT 46.5 (H) 07/03/2020   PLT 334 07/03/2020   GLUCOSE 216 (H) 07/03/2020   CHOL 219 (H) 12/13/2018   TRIG 90.0 12/13/2018   HDL 52.50 12/13/2018   LDLDIRECT 137.4 11/04/2010   LDLCALC 149 (H) 12/13/2018   ALT 19 07/03/2020  AST 19 07/03/2020   NA 137 07/03/2020   K 4.2 07/03/2020   CL 99 07/03/2020   CREATININE 1.05 (H) 07/03/2020   BUN 15 07/03/2020   CO2 29 07/03/2020   TSH 2.15 01/05/2020   HGBA1C 11.9 03/05/2021   MICROALBUR 9.0 (H) 12/13/2018    DG Chest 2 View  Result Date: 07/03/2020 CLINICAL DATA:  Chest and left arm pain. EXAM: CHEST - 2 VIEW COMPARISON:  01/30/2015 FINDINGS: The cardiac silhouette, mediastinal and hilar contours are normal. The lungs are clear. No pleural effusion, pneumothorax or pulmonary lesions. The bony thorax is intact. IMPRESSION: No acute cardiopulmonary findings. Electronically Signed   By: Rudie MeyerP.  Gallerani M.D.   On: 07/03/2020 05:49   CT Angio Chest PE W and/or Wo Contrast  Result Date: 07/03/2020 CLINICAL DATA:  PE suspected, high probability, with abdominal pain. EXAM: CT ANGIOGRAPHY CHEST CT ABDOMEN AND PELVIS WITH CONTRAST TECHNIQUE: Multidetector CT imaging of the chest was performed using the standard protocol during bolus administration of intravenous contrast. Multiplanar CT image reconstructions and MIPs were obtained to evaluate the vascular anatomy. Multidetector CT imaging of the abdomen and pelvis was performed using the standard protocol during bolus administration of intravenous contrast. CONTRAST:  100mL OMNIPAQUE IOHEXOL 350 MG/ML SOLN COMPARISON:  Radiograph 07/03/2020 FINDINGS: CTA CHEST FINDINGS Cardiovascular: Satisfactory opacification the pulmonary arteries to the segmental level. No pulmonary artery filling defects are identified. Central pulmonary arteries are normal caliber. Normal heart size. No pericardial effusion. The aorta is normal caliber. No acute aortic abnormality  is evident. Shared origin of brachiocephalic and left common carotid artery. No major venous abnormality. Mediastinum/Nodes: No mediastinal fluid or gas. Normal thyroid gland and thoracic inlet. No acute abnormality of the trachea. Small hiatal hernia. No acute esophageal abnormality. No worrisome mediastinal, hilar or axillary adenopathy. Lungs/Pleura: No consolidation, features of edema, pneumothorax, or effusion. No suspicious pulmonary nodules or masses. Minimal atelectatic changes dependently. Musculoskeletal: No chest wall abnormality. No acute or significant osseous findings. Review of the MIP images confirms the above findings. CT ABDOMEN and PELVIS FINDINGS Hepatobiliary: Diffuse hepatic hypoattenuation compatible with hepatic steatosis. Sparing along the gallbladder fossa. Smooth liver surface contour. No visible worrisome liver lesions. Normal gallbladder and biliary tree without visible calcified gallstone. Pancreas: Unremarkable. No pancreatic ductal dilatation or surrounding inflammatory changes. Spleen: Normal in size. No concerning splenic lesions. Adrenals/Urinary Tract: Normal adrenal glands. Kidneys enhance and excrete symmetrically and uniformly. Small amount of cortical scarring in the right upper pole or concerning renal mass, calculus or hydronephrosis. Urinary bladder is physiologically distended and free of acute worrisome bladder abnormality. Stomach/Bowel: Small hiatal hernia, as above. Distal stomach and duodenum are unremarkable. No small bowel thickening or dilatation. A normal appendix is visualized. No colonic dilatation or wall thickening. Vascular/Lymphatic: No significant vascular findings are present. No enlarged abdominal or pelvic lymph nodes. Reproductive: Uterus is surgically absent. No concerning adnexal lesions. Other: No abdominopelvic free fluid or free gas. No bowel containing hernias. Musculoskeletal: Asymmetric sclerotic changes of the right ilium abutting the SI joint.  Could reflect sequela of prior sacroiliitis or a more benign process such as asymmetric osteitis condensans ilii. No other acute or suspicious osseous abnormalities. Review of the MIP images confirms the above findings. IMPRESSION: 1. No evidence of pulmonary embolism. No acute intrathoracic process. 2. No acute intra-abdominal process. 3. Hepatic steatosis. 4. Asymmetric sclerotic changes of the right ilium abutting the SI joint, which may reflect sequela of prior sacroiliitis such is a psoriatic or reactive arthritis versus  a more benign process such as asymmetric osteitis condensans ilii. Correlate with clinical history. Electronically Signed   By: Kreg Shropshire M.D.   On: 07/03/2020 17:00   CT ABDOMEN PELVIS W CONTRAST  Result Date: 07/03/2020 CLINICAL DATA:  PE suspected, high probability, with abdominal pain. EXAM: CT ANGIOGRAPHY CHEST CT ABDOMEN AND PELVIS WITH CONTRAST TECHNIQUE: Multidetector CT imaging of the chest was performed using the standard protocol during bolus administration of intravenous contrast. Multiplanar CT image reconstructions and MIPs were obtained to evaluate the vascular anatomy. Multidetector CT imaging of the abdomen and pelvis was performed using the standard protocol during bolus administration of intravenous contrast. CONTRAST:  OMNIPAQUE IOHEXOL 350 MG/ML SOLN COMPARISON:  Radiograph 07/03/2020 FINDINGS: CTA CHEST FINDINGS Cardiovascular: Satisfactory opacification the pulmonary arteries to the segmental level. No pulmonary artery filling defects are identified. Central pulmonary arteries are normal caliber. Normal heart size. No pericardial effusion. The aorta is normal caliber. No acute aortic abnormality is evident. Shared origin of brachiocephalic and left common carotid artery. No major venous abnormality. Mediastinum/Nodes: No mediastinal fluid or gas. Normal thyroid gland and thoracic inlet. No acute abnormality of the trachea. Small hiatal hernia. No acute  esophageal abnormality. No worrisome mediastinal, hilar or axillary adenopathy. Lungs/Pleura: No consolidation, features of edema, pneumothorax, or effusion. No suspicious pulmonary nodules or masses. Minimal atelectatic changes dependently. Musculoskeletal: No chest wall abnormality. No acute or significant osseous findings. Review of the MIP images confirms the above findings. CT ABDOMEN and PELVIS FINDINGS Hepatobiliary: Diffuse hepatic hypoattenuation compatible with hepatic steatosis. Sparing along the gallbladder fossa. Smooth liver surface contour. No visible worrisome liver lesions. Normal gallbladder and biliary tree without visible calcified gallstone. Pancreas: Unremarkable. No pancreatic ductal dilatation or surrounding inflammatory changes. Spleen: Normal in size. No concerning splenic lesions. Adrenals/Urinary Tract: Normal adrenal glands. Kidneys enhance and excrete symmetrically and uniformly. Small amount of cortical scarring in the right upper pole or concerning renal mass, calculus or hydronephrosis. Urinary bladder is physiologically distended and free of acute worrisome bladder abnormality. Stomach/Bowel: Small hiatal hernia, as above. Distal stomach and duodenum are unremarkable. No small bowel thickening or dilatation. A normal appendix is visualized. No colonic dilatation or wall thickening. Vascular/Lymphatic: No significant vascular findings are present. No enlarged abdominal or pelvic lymph nodes. Reproductive: Uterus is surgically absent. No concerning adnexal lesions. Other: No abdominopelvic free fluid or free gas. No bowel containing hernias. Musculoskeletal: Asymmetric sclerotic changes of the right ilium abutting the SI joint. Could reflect sequela of prior sacroiliitis or a more benign process such as asymmetric osteitis condensans ilii. No other acute or suspicious osseous abnormalities. Review of the MIP images confirms the above findings. IMPRESSION: 1. No evidence of pulmonary  embolism. No acute intrathoracic process. 2. No acute intra-abdominal process. 3. Hepatic steatosis. 4. Asymmetric sclerotic changes of the right ilium abutting the SI joint, which may reflect sequela of prior sacroiliitis such is a psoriatic or reactive arthritis versus a more benign process such as asymmetric osteitis condensans ilii. Correlate with clinical history. Electronically Signed   By: Kreg Shropshire M.D.   On: 07/03/2020 17:00    Assessment & Plan:      No orders of the defined types were placed in this encounter.    Follow-up: No follow-ups on file.  Sonda Primes, MD

## 2021-08-14 NOTE — Assessment & Plan Note (Signed)
Not taking Vit D Re-start Vit D Risks associated with treatment noncompliance were discussed. Compliance was encouraged.

## 2021-08-14 NOTE — Assessment & Plan Note (Signed)
Wt Readings from Last 3 Encounters:  08/14/21 225 lb (102.1 kg)  06/24/20 219 lb (99.3 kg)  05/27/20 220 lb (99.8 kg)  Improve diet

## 2021-08-14 NOTE — Telephone Encounter (Signed)
[facility] [phone] - [place], [place] - [address] Phone:  [phone]  Fax:  [phone]     [facility] pharmacy calling in reference to  the trulicty that was ordered for this patient,   Dulaglutide (TRULICITY) 3 MG/0.5ML SOPN  TRULICITY 1.5 MG/0.5ML SOPN  Pharmacist wants to know if the Provider meant to order both, or was it an error? Also, there is a 4.5 vial  Please call [facility] pharmacy and confirm

## 2021-08-14 NOTE — Assessment & Plan Note (Signed)
Check TSH 

## 2021-08-14 NOTE — Assessment & Plan Note (Signed)
Worse Pt ran out of meds: re-start Olmesart-Amlodipine-HCT Risks associated with treatment noncompliance were discussed. Compliance was encouraged.

## 2021-08-14 NOTE — Assessment & Plan Note (Addendum)
out of meds x 2 wks Re-start DM meds: Trulicity, Jardiance, Metformin, Prandin Risks associated with treatment noncompliance were discussed. Compliance was encouraged.

## 2021-08-14 NOTE — Assessment & Plan Note (Signed)
New Re-start DM, and HTN meds Check B12, TSH, A1c

## 2021-08-15 MED ORDER — TRULICITY 3 MG/0.5ML ~~LOC~~ SOAJ: 3.0000 mg | SUBCUTANEOUS | 3 refills | Status: DC

## 2021-08-15 MED ORDER — VITAMIN D (ERGOCALCIFEROL) 1.25 MG (50000 UNIT) PO CAPS: 50000.0000 [IU] | ORAL_CAPSULE | ORAL | 0 refills | Status: DC

## 2021-08-15 MED ORDER — VITAMIN D3 50 MCG (2000 UT) PO CAPS: 2000.0000 [IU] | ORAL_CAPSULE | Freq: Every day | ORAL | 3 refills | Status: DC

## 2021-08-15 NOTE — Telephone Encounter (Signed)
I am sorry.  There was just 1 item for Trulicity on the med list.  I want her to continue with what she has been on before-no change.  Thanks

## 2021-08-15 NOTE — Telephone Encounter (Signed)
Pt states she is no longer taking the 1.5 mg.. she is taking 3 mg of the Trulicity. Sent rx stating to d/c the 1.5 mg since was put on hold at the pharmacy, and no one ever picked back up.Marland KitchenRaechel Chute

## 2021-12-09 ENCOUNTER — Other Ambulatory Visit: Payer: Self-pay | Admitting: Internal Medicine

## 2022-01-21 ENCOUNTER — Other Ambulatory Visit: Payer: Self-pay | Admitting: Internal Medicine

## 2022-02-24 ENCOUNTER — Telehealth: Payer: Self-pay | Admitting: Internal Medicine

## 2022-02-24 DIAGNOSIS — E119 Type 2 diabetes mellitus without complications: Secondary | ICD-10-CM

## 2022-02-24 MED ORDER — ONETOUCH VERIO VI STRP: 1.0000 | ORAL_STRIP | Freq: Every day | 0 refills | Status: DC

## 2022-02-24 NOTE — Telephone Encounter (Signed)
1.Medication Requested: glucose blood (ONETOUCH VERIO) test strip 100ct ? ?2. Pharmacy (Name, Street, [place]): [facility] [phone] - [place], [place] - [address]  ?Phone:  [phone] ?Fax:  [phone] ? ? ?3. On Med List: yes ? ?4. Last Visit with PCP: 09.01.22 ? ?5. Next visit date with PCP: 04.04.23 ? ? ?Agent: Please be advised that RX refills may take up to 3 business days. We ask that you follow-up with your pharmacy.  ?

## 2022-02-24 NOTE — Telephone Encounter (Signed)
Rx sent 

## 2022-03-17 ENCOUNTER — Ambulatory Visit: Payer: 59 | Admitting: Internal Medicine

## 2022-03-17 ENCOUNTER — Encounter: Payer: Self-pay | Admitting: Internal Medicine

## 2022-03-17 VITALS — BP 140/86 | HR 96 | Temp 98.0°F | Ht 63.0 in | Wt 222.0 lb

## 2022-03-17 DIAGNOSIS — I10 Essential (primary) hypertension: Secondary | ICD-10-CM

## 2022-03-17 DIAGNOSIS — Z6839 Body mass index (BMI) 39.0-39.9, adult: Secondary | ICD-10-CM

## 2022-03-17 DIAGNOSIS — D509 Iron deficiency anemia, unspecified: Secondary | ICD-10-CM | POA: Diagnosis not present

## 2022-03-17 DIAGNOSIS — G4700 Insomnia, unspecified: Secondary | ICD-10-CM

## 2022-03-17 DIAGNOSIS — R4589 Other symptoms and signs involving emotional state: Secondary | ICD-10-CM

## 2022-03-17 DIAGNOSIS — E559 Vitamin D deficiency, unspecified: Secondary | ICD-10-CM | POA: Diagnosis not present

## 2022-03-17 DIAGNOSIS — E1165 Type 2 diabetes mellitus with hyperglycemia: Secondary | ICD-10-CM

## 2022-03-17 DIAGNOSIS — E042 Nontoxic multinodular goiter: Secondary | ICD-10-CM

## 2022-03-17 LAB — CBC WITH DIFFERENTIAL/PLATELET
Basophils Absolute: 0.1 10*3/uL (ref 0.0–0.1)
Basophils Relative: 0.7 % (ref 0.0–3.0)
Eosinophils Absolute: 0.1 10*3/uL (ref 0.0–0.7)
Eosinophils Relative: 0.9 % (ref 0.0–5.0)
HCT: 45.7 % (ref 36.0–46.0)
Hemoglobin: 15.3 g/dL — ABNORMAL HIGH (ref 12.0–15.0)
Lymphocytes Relative: 30.3 % (ref 12.0–46.0)
Lymphs Abs: 3 10*3/uL (ref 0.7–4.0)
MCHC: 33.4 g/dL (ref 30.0–36.0)
MCV: 87.1 fl (ref 78.0–100.0)
Monocytes Absolute: 0.9 10*3/uL (ref 0.1–1.0)
Monocytes Relative: 8.8 % (ref 3.0–12.0)
Neutro Abs: 5.9 10*3/uL (ref 1.4–7.7)
Neutrophils Relative %: 59.3 % (ref 43.0–77.0)
Platelets: 311 10*3/uL (ref 150.0–400.0)
RBC: 5.25 Mil/uL — ABNORMAL HIGH (ref 3.87–5.11)
RDW: 13.7 % (ref 11.5–15.5)
WBC: 9.9 10*3/uL (ref 4.0–10.5)

## 2022-03-17 LAB — TSH: TSH: 1.72 u[IU]/mL (ref 0.35–5.50)

## 2022-03-17 LAB — COMPREHENSIVE METABOLIC PANEL
ALT: 23 U/L (ref 0–35)
AST: 15 U/L (ref 0–37)
Albumin: 4.6 g/dL (ref 3.5–5.2)
Alkaline Phosphatase: 87 U/L (ref 39–117)
BUN: 23 mg/dL (ref 6–23)
CO2: 32 mEq/L (ref 19–32)
Calcium: 10.4 mg/dL (ref 8.4–10.5)
Chloride: 95 mEq/L — ABNORMAL LOW (ref 96–112)
Creatinine, Ser: 1.01 mg/dL (ref 0.40–1.20)
GFR: 64.58 mL/min (ref >60.00)
Glucose, Bld: 321 mg/dL — ABNORMAL HIGH (ref 70–99)
Potassium: 3.9 mEq/L (ref 3.5–5.1)
Sodium: 136 mEq/L (ref 135–145)
Total Bilirubin: 0.4 mg/dL (ref 0.2–1.2)
Total Protein: 8.5 g/dL — ABNORMAL HIGH (ref 6.0–8.3)

## 2022-03-17 LAB — HEMOGLOBIN A1C: Hgb A1c MFr Bld: 12.2 % — ABNORMAL HIGH (ref 4.6–6.5)

## 2022-03-17 LAB — VITAMIN D 25 HYDROXY (VIT D DEFICIENCY, FRACTURES): VITD: 25.68 ng/mL — ABNORMAL LOW (ref 30.00–100.00)

## 2022-03-17 MED ORDER — EMPAGLIFLOZIN 25 MG PO TABS: 25.0000 mg | ORAL_TABLET | Freq: Every day | ORAL | 3 refills | Status: DC

## 2022-03-17 MED ORDER — ESCITALOPRAM OXALATE 5 MG PO TABS: 5.0000 mg | ORAL_TABLET | Freq: Every day | ORAL | 5 refills | Status: AC

## 2022-03-17 MED ORDER — TRULICITY 3 MG/0.5ML ~~LOC~~ SOAJ: 3.0000 mg | SUBCUTANEOUS | 3 refills | Status: DC

## 2022-03-17 MED ORDER — BUDESONIDE-FORMOTEROL FUMARATE 160-4.5 MCG/ACT IN AERO: 2.0000 | INHALATION_SPRAY | Freq: Two times a day (BID) | RESPIRATORY_TRACT | 5 refills | Status: DC

## 2022-03-17 MED ORDER — FLUTICASONE PROPIONATE 50 MCG/ACT NA SUSP: 2.0000 | Freq: Every day | NASAL | 11 refills | Status: DC

## 2022-03-17 NOTE — Assessment & Plan Note (Addendum)
Continue on Trulicity, Jardiance, Metformin, Prandin ? 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  Obtain endocrinology consultation ?

## 2022-03-17 NOTE — Assessment & Plan Note (Signed)
Valerian root, Melatonin, Tylenol PM for insomnia ?

## 2022-03-17 NOTE — Progress Notes (Addendum)
? ?Subjective:  ?Patient ID: [name] [name], female    DOB: 07/27/1971  Age: 51 y.o. MRN: [id number] ? ?CC: No chief complaint on file. ? ? ?HPI ?[name] [name] presents for DM, HTN, obesity f/u ?C/o insomnia. C/o relationship difficulties ? ?Outpatient Medications Prior to Visit  ?Medication Sig Dispense Refill  ? Cholecalciferol (VITAMIN D3) 50 MCG (2000 UT) capsule Take 1 capsule (2,000 Units total) by mouth daily. 100 capsule 3  ? glucose blood (ONETOUCH VERIO) test strip 1 each by Other route daily. Use as instructed 100 each 0  ? metFORMIN (GLUCOPHAGE) 500 MG tablet Take 2 tablets (1,000 mg total) by mouth 2 (two) times daily with a meal. 120 tablet 11  ? Olmesartan-amLODIPine-HCTZ 40-10-25 MG TABS Take 1 tablet by mouth daily. 30 tablet 11  ? omeprazole (PRILOSEC) 20 MG capsule Take 1 capsule (20 mg total) by mouth daily. 30 capsule 11  ? repaglinide (PRANDIN) 2 MG tablet Take 1 tablet (2 mg total) by mouth 3 (three) times daily before meals. 90 tablet 11  ? sucralfate (CARAFATE) 1 GM/10ML suspension Take 10 mLs (1 g total) by mouth 4 (four) times daily -  with meals and at bedtime. 420 mL 11  ? Dulaglutide (TRULICITY) 3 MG/0.5ML SOPN Inject 3 mg into the skin once a week. 2 mL 3  ? empagliflozin (JARDIANCE) 25 MG TABS tablet Take 1 tablet (25 mg total) by mouth daily. 30 tablet 11  ? fluticasone (FLONASE) 50 MCG/ACT nasal spray Place 2 sprays into both nostrils daily. (Patient taking differently: Place 2 sprays into both nostrils as needed.) 16 g 0  ? Vitamin D, Ergocalciferol, (DRISDOL) 1.25 MG (50000 UNIT) CAPS capsule TAKE 1 CAPSULE BY MOUTH EVERY 7 DAYS 8 capsule 0  ? ?No facility-administered medications prior to visit.  ? ? ?ROS: ?Review of Systems  ?Constitutional:  Positive for fatigue. Negative for activity change, appetite change, chills and unexpected weight change.  ?HENT:  Negative for congestion, mouth sores and sinus pressure.   ?Eyes:  Negative for visual disturbance.  ?Respiratory:   Negative for cough and chest tightness.   ?Gastrointestinal:  Negative for abdominal pain and nausea.  ?Genitourinary:  Negative for difficulty urinating, frequency and vaginal pain.  ?Musculoskeletal:  Negative for back pain and gait problem.  ?Skin:  Negative for pallor and rash.  ?Neurological:  Negative for dizziness, tremors, weakness, numbness and headaches.  ?Psychiatric/Behavioral:  Negative for confusion, sleep disturbance and suicidal ideas.   ? ?Objective:  ?BP 140/86 (BP Location: Left Arm, Patient Position: Sitting, Cuff Size: Large)   Pulse 96   Temp 98 ?F (36.7 ?C) (Oral)   Ht 5\' 3"  (1.6 m)   Wt 222 lb (100.7 kg)   SpO2 96%   BMI 39.33 kg/m?  ? ?BP Readings from Last 3 Encounters:  ?03/17/22 140/86  ?08/14/21 (!) 162/110  ?07/03/20 (!) 170/104  ? ? ?Wt Readings from Last 3 Encounters:  ?03/17/22 222 lb (100.7 kg)  ?08/14/21 225 lb (102.1 kg)  ?06/24/20 219 lb (99.3 kg)  ? ? ?Physical Exam ?Constitutional:   ?   General: [name] [name] is not in acute distress. ?   Appearance: [name] [name] is well-developed. [name] [name] is obese.  ?HENT:  ?   Head: Normocephalic.  ?   Right Ear: External ear normal.  ?   Left Ear: External ear normal.  ?   Nose: Nose normal.  ?Eyes:  ?   General:     ?   Right eye: No discharge.     ?  Left eye: No discharge.  ?   Conjunctiva/sclera: Conjunctivae normal.  ?   Pupils: Pupils are equal, round, and reactive to light.  ?Neck:  ?   Thyroid: No thyromegaly.  ?   Vascular: No JVD.  ?   Trachea: No tracheal deviation.  ?Cardiovascular:  ?   Rate and Rhythm: Normal rate and regular rhythm.  ?   Heart sounds: Normal heart sounds.  ?Pulmonary:  ?   Effort: No respiratory distress.  ?   Breath sounds: No stridor. No wheezing.  ?Abdominal:  ?   General: Bowel sounds are normal. There is no distension.  ?   Palpations: Abdomen is soft. There is no mass.  ?   Tenderness: There is no abdominal tenderness. There is no guarding or rebound.  ?Musculoskeletal:     ?   General: No tenderness.  ?   Cervical  back: Normal range of motion and neck supple. No rigidity.  ?Lymphadenopathy:  ?   Cervical: No cervical adenopathy.  ?Skin: ?   Findings: No erythema or rash.  ?Neurological:  ?   Cranial Nerves: No cranial nerve deficit.  ?   Motor: No abnormal muscle tone.  ?   Coordination: Coordination normal.  ?   Deep Tendon Reflexes: Reflexes normal.  ?Psychiatric:     ?   Behavior: Behavior normal.     ?   Thought Content: Thought content normal.     ?   Judgment: Judgment normal.  ?Tearful.  No suicidal or homicidal ? ?45 minutes were spent on this encounter.  The time was dedicated to reviewing laboratory data, disease status update, treatment choices and addressing stress amomg other issues. ? ?Lab Results  ?Component Value Date  ? WBC 9.9 03/17/2022  ? HGB 15.3 (H) 03/17/2022  ? HCT 45.7 03/17/2022  ? PLT 311.0 03/17/2022  ? GLUCOSE 321 (H) 03/17/2022  ? CHOL 201 (H) 08/14/2021  ? TRIG 75.0 08/14/2021  ? HDL 49.00 08/14/2021  ? LDLDIRECT 137.4 11/04/2010  ? LDLCALC 137 (H) 08/14/2021  ? ALT 23 03/17/2022  ? AST 15 03/17/2022  ? NA 136 03/17/2022  ? K 3.9 03/17/2022  ? CL 95 (L) 03/17/2022  ? CREATININE 1.01 03/17/2022  ? BUN 23 03/17/2022  ? CO2 32 03/17/2022  ? TSH 1.72 03/17/2022  ? HGBA1C 12.2 (H) 03/17/2022  ? MICROALBUR 13.0 (H) 08/14/2021  ? ? ?DG Chest 2 View ? ?Result Date: 07/03/2020 ?CLINICAL DATA:  Chest and left arm pain. EXAM: CHEST - 2 VIEW COMPARISON:  01/30/2015 FINDINGS: The cardiac silhouette, mediastinal and hilar contours are normal. The lungs are clear. No pleural effusion, pneumothorax or pulmonary lesions. The bony thorax is intact. IMPRESSION: No acute cardiopulmonary findings. Electronically Signed   By: Rudie Meyer M.D.   On: 07/03/2020 05:49  ? ?CT Angio Chest PE W and/or Wo Contrast ? ?Result Date: 07/03/2020 ?CLINICAL DATA:  PE suspected, high probability, with abdominal pain. EXAM: CT ANGIOGRAPHY CHEST CT ABDOMEN AND PELVIS WITH CONTRAST TECHNIQUE: Multidetector CT imaging of the chest was  performed using the standard protocol during bolus administration of intravenous contrast. Multiplanar CT image reconstructions and MIPs were obtained to evaluate the vascular anatomy. Multidetector CT imaging of the abdomen and pelvis was performed using the standard protocol during bolus administration of intravenous contrast. CONTRAST:  OMNIPAQUE IOHEXOL 350 MG/ML SOLN COMPARISON:  Radiograph 07/03/2020 FINDINGS: CTA CHEST FINDINGS Cardiovascular: Satisfactory opacification the pulmonary arteries to the segmental level. No pulmonary artery filling defects are identified. Central  pulmonary arteries are normal caliber. Normal heart size. No pericardial effusion. The aorta is normal caliber. No acute aortic abnormality is evident. Shared origin of brachiocephalic and left common carotid artery. No major venous abnormality. Mediastinum/Nodes: No mediastinal fluid or gas. Normal thyroid gland and thoracic inlet. No acute abnormality of the trachea. Small hiatal hernia. No acute esophageal abnormality. No worrisome mediastinal, hilar or axillary adenopathy. Lungs/Pleura: No consolidation, features of edema, pneumothorax, or effusion. No suspicious pulmonary nodules or masses. Minimal atelectatic changes dependently. Musculoskeletal: No chest wall abnormality. No acute or significant osseous findings. Review of the MIP images confirms the above findings. CT ABDOMEN and PELVIS FINDINGS Hepatobiliary: Diffuse hepatic hypoattenuation compatible with hepatic steatosis. Sparing along the gallbladder fossa. Smooth liver surface contour. No visible worrisome liver lesions. Normal gallbladder and biliary tree without visible calcified gallstone. Pancreas: Unremarkable. No pancreatic ductal dilatation or surrounding inflammatory changes. Spleen: Normal in size. No concerning splenic lesions. Adrenals/Urinary Tract: Normal adrenal glands. Kidneys enhance and excrete symmetrically and uniformly. Small amount of cortical  scarring in the right upper pole or concerning renal mass, calculus or hydronephrosis. Urinary bladder is physiologically distended and free of acute worrisome bladder abnormality. Stomach/Bowel: Small hiatal hernia, as

## 2022-03-17 NOTE — Assessment & Plan Note (Signed)
BP nl at home per pt Olmesart-Amlodipine-HCT 

## 2022-03-17 NOTE — Assessment & Plan Note (Signed)
A little better ?Cont w/diet, Trulicity ?

## 2022-03-17 NOTE — Assessment & Plan Note (Signed)
On Vit D ?Check Vit D level ?

## 2022-03-17 NOTE — Patient Instructions (Signed)
Valerian root, Melatonin, Tylenol PM for insomnia ?

## 2022-03-18 DIAGNOSIS — R4589 Other symptoms and signs involving emotional state: Secondary | ICD-10-CM | POA: Insufficient documentation

## 2022-03-18 LAB — IRON,TIBC AND FERRITIN PANEL
%SAT: 26 % (calc) (ref 16–45)
Ferritin: 171 ng/mL (ref 16–232)
Iron: 80 ug/dL (ref 45–160)
TIBC: 305 mcg/dL (calc) (ref 250–450)

## 2022-03-18 MED ORDER — VITAMIN D (ERGOCALCIFEROL) 1.25 MG (50000 UNIT) PO CAPS: 50000.0000 [IU] | ORAL_CAPSULE | ORAL | 0 refills | Status: DC

## 2022-03-18 NOTE — Addendum Note (Signed)
Addended by: Tresa Garter on: 03/18/2022 07:39 AM ? ? Modules accepted: Orders, Level of Service ? ?

## 2022-03-18 NOTE — Assessment & Plan Note (Addendum)
Stress in the relationship.  Psychology referral.  Start low-dose Lexapro ? Symphony broke up with her boyfriend of 2 years ?

## 2022-03-18 NOTE — Assessment & Plan Note (Signed)
Endocrinology follow-up ?

## 2022-06-10 ENCOUNTER — Encounter (HOSPITAL_COMMUNITY): Payer: Self-pay

## 2022-06-10 ENCOUNTER — Ambulatory Visit (HOSPITAL_COMMUNITY)
Admission: EM | Admit: 2022-06-10 | Discharge: 2022-06-10 | Disposition: A | Discharge status: Home / Self Care | Payer: 59 | Attending: Internal Medicine | Admitting: Internal Medicine

## 2022-06-10 DIAGNOSIS — R21 Rash and other nonspecific skin eruption: Secondary | ICD-10-CM | POA: Diagnosis present

## 2022-06-10 DIAGNOSIS — A6000 Herpesviral infection of urogenital system, unspecified: Secondary | ICD-10-CM | POA: Insufficient documentation

## 2022-06-10 DIAGNOSIS — N949 Unspecified condition associated with female genital organs and menstrual cycle: Secondary | ICD-10-CM | POA: Diagnosis not present

## 2022-06-10 LAB — POCT URINALYSIS DIPSTICK, ED / UC
Bilirubin Urine: NEGATIVE
Glucose, UA: 250 mg/dL — AB
Hgb urine dipstick: NEGATIVE
Ketones, ur: NEGATIVE mg/dL
Leukocytes,Ua: NEGATIVE
Nitrite: NEGATIVE
Protein, ur: NEGATIVE mg/dL
Specific Gravity, Urine: 1.02 (ref 1.005–1.030)
Urobilinogen, UA: 0.2 mg/dL (ref 0.0–1.0)
pH: 5 (ref 5.0–8.0)

## 2022-06-10 MED ORDER — TRIAMCINOLONE ACETONIDE 0.1 % EX CREA: 1.0000 | TOPICAL_CREAM | Freq: Two times a day (BID) | CUTANEOUS | 0 refills | Status: DC

## 2022-06-10 MED ORDER — VALACYCLOVIR HCL 1 G PO TABS: 1000.0000 mg | ORAL_TABLET | Freq: Three times a day (TID) | ORAL | 0 refills | Status: AC

## 2022-06-10 MED ORDER — ACETAMINOPHEN 500 MG PO TABS: 1000.0000 mg | ORAL_TABLET | Freq: Four times a day (QID) | ORAL | 0 refills | Status: AC | PRN

## 2022-06-10 NOTE — ED Provider Notes (Signed)
[facility]    CSN: [id number] Arrival date & time: 06/10/22  0802      History   Chief Complaint Chief Complaint  Patient presents with   Rash    HPI [name] [name] is a 51 y.o. female.   Patient presents to urgent care for evaluation of her rash to her left inner thigh that developed last week and lesions to her left upper arm that developed in the last 3 to 4 days.  Lesions are not draining at this time.  Patient denies fever/chills, nausea, abdominal pain, dizziness, and headache.  She states that the lesions to her left upper arm feel like a burning and tingling sensation with mild itch.  Denies exposure to new recent irritants or allergens such as plants, perfumes, laundry detergents, body wash, or any other personal hygiene products.  Patient states that she has some numbness and tingling to her right hand at baseline, but attributes this to her diabetes diagnosis.  She is also complaining of vaginal discomfort at a 3 on a scale of 0-10.  Vaginal discomfort has been present ever since she was seen by her OB/GYN 2 weeks ago and diagnosed with a bladder infection.  She denies vaginal itching, discharge, and odor.  She has been taking Augmentin and TMP-SMX for her bladder infection for the last week and is almost finished with her course of antibiotics.  She states that her urinary frequency has improved, but she is still experiencing some dysuria.  Patient has a history of genital herpes and suspected that her vaginal discomfort may be related to an outbreak.  She has been taking Valtrex once daily for the last 3 days without relief of symptoms.      Past Medical History:  Diagnosis Date   Anemia, iron deficiency    Chronic headache    Dr Vela Prose, Neg MRI 2005   Diabetes mellitus    Elevated glucose 2011   GERD (gastroesophageal reflux disease)    HTN (hypertension)     Patient Active Problem List   Diagnosis Date Noted   Depressed mood 03/18/2022   Genital  herpes simplex 08/05/2021   Gastroenteritis 06/24/2020   Greater trochanteric bursitis of right hip 08/30/2019   Leg wound, left 06/20/2019   Bacterial vaginosis 04/12/2019   Pruritus of vulva 04/12/2019   Callus of foot 12/13/2018   Bursitis of right shoulder 09/01/2017   Dyslipidemia 08/17/2017   Shoulder pain, right 08/17/2017   Mild intermittent asthma with acute exacerbation 08/04/2017   RTI (respiratory tract infection) 08/04/2017   Dysuria 03/16/2017   Tick bite of back 03/16/2017   Multinodular goiter 11/03/2016   Wellness examination 11/03/2016   Ear itching 08/21/2016   Goiter 03/07/2014   Paresthesias 07/25/2013   Low back pain 10/13/2012   Herpes zoster 10/13/2012   Cough 04/20/2011   Vitamin D deficiency 03/08/2011   Uncontrolled diabetes mellitus with hyperglycemia (HCC) 03/06/2011   INSOMNIA, CHRONIC 07/28/2010   OBESITY 06/24/2009   GERD 03/11/2009   ANEMIA-IRON DEFICIENCY 10/19/2007   Essential hypertension 10/19/2007    Past Surgical History:  Procedure Laterality Date   ABDOMINAL HYSTERECTOMY     BREAST REDUCTION SURGERY     Cyst removed from vagina     INDUCED ABORTION  1990-1994   x 4   NOSE SURGERY     PARTIAL HYSTERECTOMY  2010   TUBAL LIGATION      OB History   No obstetric history on file.  Home Medications    Prior to Admission medications   Medication Sig Start Date End Date Taking? Authorizing Provider  acetaminophen (TYLENOL) 500 MG tablet Take 2 tablets (1,000 mg total) by mouth every 6 (six) hours as needed. 06/10/22  Yes Carlisle Beers, FNP  triamcinolone cream (KENALOG) 0.1 % Apply 1 Application topically 2 (two) times daily. 06/10/22  Yes Carlisle Beers, FNP  valACYclovir (VALTREX) 1000 MG tablet Take 1 tablet (1,000 mg total) by mouth 3 (three) times daily for 7 days. 06/10/22 06/17/22 Yes Malaiah Viramontes, Donavan Burnet, FNP  budesonide-formoterol (SYMBICORT) 160-4.5 MCG/ACT inhaler Inhale 2 puffs into the lungs 2 (two)  times daily. 03/17/22 03/17/23  Plotnikov, Georgina Quint, MD  Cholecalciferol (VITAMIN D3) 50 MCG (2000 UT) capsule Take 1 capsule (2,000 Units total) by mouth daily. 08/15/21   Plotnikov, Georgina Quint, MD  Dulaglutide (TRULICITY) 3 MG/0.5ML SOPN Inject 3 mg into the skin once a week. 03/17/22   Plotnikov, Georgina Quint, MD  empagliflozin (JARDIANCE) 25 MG TABS tablet Take 1 tablet (25 mg total) by mouth daily. 03/17/22   Plotnikov, Georgina Quint, MD  escitalopram (LEXAPRO) 5 MG tablet Take 1 tablet (5 mg total) by mouth at bedtime. 03/17/22   Plotnikov, Georgina Quint, MD  fluticasone (FLONASE) 50 MCG/ACT nasal spray Place 2 sprays into both nostrils daily. 03/17/22   Plotnikov, Georgina Quint, MD  glucose blood (ONETOUCH VERIO) test strip 1 each by Other route daily. Use as instructed 02/24/22   Plotnikov, Georgina Quint, MD  metFORMIN (GLUCOPHAGE) 500 MG tablet Take 2 tablets (1,000 mg total) by mouth 2 (two) times daily with a meal. 08/14/21   Plotnikov, Georgina Quint, MD  Olmesartan-amLODIPine-HCTZ 40-10-25 MG TABS Take 1 tablet by mouth daily. 08/14/21   Plotnikov, Georgina Quint, MD  omeprazole (PRILOSEC) 20 MG capsule Take 1 capsule (20 mg total) by mouth daily. 08/14/21   Plotnikov, Georgina Quint, MD  repaglinide (PRANDIN) 2 MG tablet Take 1 tablet (2 mg total) by mouth 3 (three) times daily before meals. 08/14/21   Plotnikov, Georgina Quint, MD  sucralfate (CARAFATE) 1 GM/10ML suspension Take 10 mLs (1 g total) by mouth 4 (four) times daily -  with meals and at bedtime. 08/14/21   Plotnikov, Georgina Quint, MD  Vitamin D, Ergocalciferol, (DRISDOL) 1.25 MG (50000 UNIT) CAPS capsule Take 1 capsule (50,000 Units total) by mouth every 7 (seven) days. 03/18/22   Plotnikov, Georgina Quint, MD    Family History Family History  Problem Relation Age of Onset   Hypertension Mother    Esophageal cancer Mother 62   Healthy Father    Hypertension Maternal Grandmother    Stroke Maternal Grandmother    Ovarian cancer Sister    Prostate cancer Maternal Grandfather     Social  History Social History   Tobacco Use   Smoking status: Never   Smokeless tobacco: Never  Vaping Use   Vaping Use: Never used  Substance Use Topics   Alcohol use: Yes    Alcohol/week: 0.0 standard drinks of alcohol    Comment: occasionally 2 per week   Drug use: No     Allergies   Ciprofloxacin and Tramadol   Review of Systems Review of Systems Per HPI  Physical Exam Triage Vital Signs ED Triage Vitals [06/10/22 0814]  Enc Vitals Group     BP (!) 134/93     Pulse Rate 95     Resp 18     Temp 98.5 F (36.9 C)     Temp Source  Oral     SpO2 100 %     Weight      Height      Head Circumference      Peak Flow      Pain Score      Pain Loc      Pain Edu?      Excl. in GC?    No data found.  Updated Vital Signs BP (!) 134/93 (BP Location: Left Arm)   Pulse 95   Temp 98.5 F (36.9 C) (Oral)   Resp 18   SpO2 100%   Visual Acuity Right Eye Distance:   Left Eye Distance:   Bilateral Distance:    Right Eye Near:   Left Eye Near:    Bilateral Near:     Physical Exam Vitals and nursing note reviewed. Exam conducted with a chaperone present [facility], CMA).  Constitutional:      Appearance: Normal appearance. She is not ill-appearing or toxic-appearing.     Comments: Very pleasant patient sitting on exam in position of comfort table in no acute distress.   HENT:     Head: Normocephalic and atraumatic.     Right Ear: Hearing and external ear normal.     Left Ear: Hearing and external ear normal.     Nose: Nose normal. No rhinorrhea.     Mouth/Throat:     Lips: Pink.     Mouth: Mucous membranes are moist.     Pharynx: No posterior oropharyngeal erythema.  Eyes:     General: Lids are normal. Vision grossly intact. Gaze aligned appropriately.     Extraocular Movements: Extraocular movements intact.     Conjunctiva/sclera: Conjunctivae normal.  Cardiovascular:     Rate and Rhythm: Normal rate and regular rhythm.     Heart sounds: Normal heart sounds, S1 normal  and S2 normal.  Pulmonary:     Effort: Pulmonary effort is normal. No respiratory distress.     Breath sounds: Normal breath sounds and air entry.  Abdominal:     General: Bowel sounds are normal.     Palpations: Abdomen is soft.     Tenderness: There is no abdominal tenderness. There is no right CVA tenderness, left CVA tenderness or guarding.  Genitourinary:    General: Normal vulva.     Exam position: Knee-chest position.     Vagina: No vaginal discharge.     Rectum: Normal.    Musculoskeletal:     Cervical back: Neck supple.  Skin:    General: Skin is warm and dry.     Capillary Refill: Capillary refill takes less than 2 seconds.     Findings: No rash.  Neurological:     General: No focal deficit present.     Mental Status: She is alert and oriented to person, place, and time. Mental status is at baseline.     Cranial Nerves: No dysarthria or facial asymmetry.     Gait: Gait is intact.  Psychiatric:        Mood and Affect: Mood normal.        Speech: Speech normal.        Behavior: Behavior normal.        Thought Content: Thought content normal.        Judgment: Judgment normal.         Left upper arm    Left inner thigh lesions  UC Treatments / Results  Labs (all labs ordered are listed, but only abnormal results are displayed)  Labs Reviewed  POCT URINALYSIS DIPSTICK, ED / UC - Abnormal; Notable for the following components:      Result Value   Glucose, UA 250 (*)    All other components within normal limits  HSV CULTURE AND TYPING    EKG   Radiology No results found.  Procedures Procedures (including critical care time)  Medications Ordered in UC Medications - No data to display  Initial Impression / Assessment and Plan / UC Course  I have reviewed the triage vital signs and the nursing notes.  Pertinent labs & imaging results that were available during my care of the patient were reviewed by me and considered in my medical decision making  (see chart for details).  Rash and nonspecific skin eruption Lesions to the left inner thigh appear to be healing well with triamcinolone cream. Recommend continued use of triamcinolone cream 2 times daily for the next 5-7 days. Discontinue sooner if rash resolves.  Unsure of etiology of lesions to left upper arm, but suspect they may be viral in nature. Plan to treat lesions to upper arm with course of valtrex 3 times daily for 7 days. This will also treat genital herpes outbreak. Swab sent to confirm genital HSV lesions and pending. Patient has a history of genital herpes and suspect current outbreak is cause of vaginal discomfort. Tylenol 1,000 mg may be used every 6 hours as needed for any discomfort she may experience. Urinalysis shows improvement after antibiotics to treat urinary tract infection.   Discussed physical exam and available lab work findings in clinic with patient.  Counseled patient regarding appropriate use of medications and potential side effects for all medications recommended or prescribed today. Discussed red flag signs and symptoms of worsening condition,when to call the PCP office, return to urgent care, and when to seek higher level of care in the emergency department. Patient verbalizes understanding and agreement with plan. All questions answered. Patient discharged in stable condition.   Final Clinical Impressions(s) / UC Diagnoses   Final diagnoses:  Rash and nonspecific skin eruption  Genital herpes simplex, unspecified site  Vaginal discomfort     Discharge Instructions      Continue applying triamcinolone cream to the rash on your left inner thigh.  Take Valtrex 3 times daily for the next 7 days for your rash to your upper arm as well as your genital herpes outbreak.  You may take Tylenol 1000 mg every 6 hours as needed for any pain or discomfort you may have.  Your urine is without infection today.  If you develop any new or worsening symptoms or do not  improve in the next 2 to 3 days, please return.  If your symptoms are severe, please go to the emergency room.  Follow-up with your primary care provider for further evaluation and management of your symptoms as well as ongoing wellness visits.  I hope you feel better!      ED Prescriptions     Medication Sig Dispense Auth. Provider   triamcinolone cream (KENALOG) 0.1 % Apply 1 Application topically 2 (two) times daily. 30 g Reita May M, FNP   valACYclovir (VALTREX) 1000 MG tablet Take 1 tablet (1,000 mg total) by mouth 3 (three) times daily for 7 days. 21 tablet Reita May M, FNP   acetaminophen (TYLENOL) 500 MG tablet Take 2 tablets (1,000 mg total) by mouth every 6 (six) hours as needed. 30 tablet Carlisle Beers, FNP      PDMP not reviewed this  encounter.   Carlisle BeersStanhope, Daymeon Fischman M, OregonFNP 06/12/22 2040

## 2022-06-10 NOTE — ED Triage Notes (Signed)
Pt c/o rash all over body x 2-3 days.

## 2022-06-10 NOTE — Discharge Instructions (Addendum)
Continue applying triamcinolone cream to the rash on your left inner thigh.  Take Valtrex 3 times daily for the next 7 days for your rash to your upper arm as well as your genital herpes outbreak.  You may take Tylenol 1000 mg every 6 hours as needed for any pain or discomfort you may have.  Your urine is without infection today.  If you develop any new or worsening symptoms or do not improve in the next 2 to 3 days, please return.  If your symptoms are severe, please go to the emergency room.  Follow-up with your primary care provider for further evaluation and management of your symptoms as well as ongoing wellness visits.  I hope you feel better!

## 2022-06-12 LAB — HSV CULTURE AND TYPING

## 2022-07-09 ENCOUNTER — Ambulatory Visit: Payer: 59 | Admitting: Internal Medicine

## 2022-07-14 ENCOUNTER — Other Ambulatory Visit: Payer: Self-pay

## 2022-07-14 ENCOUNTER — Emergency Department (HOSPITAL_BASED_OUTPATIENT_CLINIC_OR_DEPARTMENT_OTHER): Payer: 59

## 2022-07-14 ENCOUNTER — Emergency Department (HOSPITAL_COMMUNITY): Payer: 59

## 2022-07-14 ENCOUNTER — Encounter (HOSPITAL_BASED_OUTPATIENT_CLINIC_OR_DEPARTMENT_OTHER): Payer: Self-pay

## 2022-07-14 ENCOUNTER — Emergency Department (HOSPITAL_BASED_OUTPATIENT_CLINIC_OR_DEPARTMENT_OTHER)
Admission: EM | Admit: 2022-07-14 | Discharge: 2022-07-14 | Disposition: A | Discharge status: Home / Self Care | Payer: 59 | Attending: Emergency Medicine | Admitting: Emergency Medicine

## 2022-07-14 DIAGNOSIS — Z7984 Long term (current) use of oral hypoglycemic drugs: Secondary | ICD-10-CM | POA: Diagnosis not present

## 2022-07-14 DIAGNOSIS — M542 Cervicalgia: Secondary | ICD-10-CM | POA: Insufficient documentation

## 2022-07-14 DIAGNOSIS — Y92410 Unspecified street and highway as the place of occurrence of the external cause: Secondary | ICD-10-CM | POA: Diagnosis not present

## 2022-07-14 DIAGNOSIS — M545 Low back pain, unspecified: Secondary | ICD-10-CM | POA: Diagnosis not present

## 2022-07-14 DIAGNOSIS — Z7985 Long-term (current) use of injectable non-insulin antidiabetic drugs: Secondary | ICD-10-CM | POA: Insufficient documentation

## 2022-07-14 DIAGNOSIS — E119 Type 2 diabetes mellitus without complications: Secondary | ICD-10-CM | POA: Diagnosis not present

## 2022-07-14 DIAGNOSIS — I10 Essential (primary) hypertension: Secondary | ICD-10-CM | POA: Insufficient documentation

## 2022-07-14 DIAGNOSIS — M5020 Other cervical disc displacement, unspecified cervical region: Secondary | ICD-10-CM

## 2022-07-14 DIAGNOSIS — R519 Headache, unspecified: Secondary | ICD-10-CM | POA: Insufficient documentation

## 2022-07-14 DIAGNOSIS — Z79899 Other long term (current) drug therapy: Secondary | ICD-10-CM | POA: Diagnosis not present

## 2022-07-14 MED ORDER — METHYLPREDNISOLONE 4 MG PO TBPK: ORAL_TABLET | ORAL | 0 refills | Status: DC

## 2022-07-14 MED ORDER — OXYCODONE-ACETAMINOPHEN 5-325 MG PO TABS
1.0000 | ORAL_TABLET | Freq: Once | ORAL | Status: AC
  Administered 2022-07-14: 1 via ORAL
  Filled 2022-07-14: qty 1

## 2022-07-14 MED ORDER — IBUPROFEN 800 MG PO TABS
800.0000 mg | ORAL_TABLET | Freq: Once | ORAL | Status: AC
  Administered 2022-07-14: 800 mg via ORAL
  Filled 2022-07-14: qty 1

## 2022-07-14 MED ORDER — METHOCARBAMOL 500 MG PO TABS: 500.0000 mg | ORAL_TABLET | Freq: Three times a day (TID) | ORAL | 0 refills | Status: DC | PRN

## 2022-07-14 MED ORDER — METHOCARBAMOL 500 MG PO TABS
500.0000 mg | ORAL_TABLET | Freq: Once | ORAL | Status: AC
  Administered 2022-07-14: 500 mg via ORAL
  Filled 2022-07-14: qty 1

## 2022-07-14 MED ORDER — NAPROXEN 500 MG PO TABS: 500.0000 mg | ORAL_TABLET | Freq: Two times a day (BID) | ORAL | 0 refills | Status: DC

## 2022-07-14 MED ORDER — LIDOCAINE 5 % EX PTCH
1.0000 | MEDICATED_PATCH | Freq: Once | CUTANEOUS | Status: DC
  Administered 2022-07-14: 1 via TRANSDERMAL
  Filled 2022-07-14: qty 1
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
## 2022-07-14 NOTE — ED Notes (Signed)
Patient transported to MRI 

## 2022-07-14 NOTE — ED Notes (Signed)
Spoke to [employer] , made aware of patient's transfer to their facility for MRI testing .  Pt transfer via POV .
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
## 2022-07-14 NOTE — ED Notes (Addendum)
Pt arrives to ED from [facility] for transfer for MRI. Pt endorses numbness in R hand from fingers to mid/lower-forearm. Pt arrives in [employer]. A/ox4. Appears in NAD. EDP Kneller at bedside.

## 2022-07-16 ENCOUNTER — Ambulatory Visit: Payer: 59 | Admitting: Internal Medicine

## 2022-07-16 ENCOUNTER — Encounter: Payer: Self-pay | Admitting: Internal Medicine

## 2022-07-16 VITALS — BP 160/118 | HR 100 | Temp 98.6°F | Ht 63.0 in

## 2022-07-16 DIAGNOSIS — E1165 Type 2 diabetes mellitus with hyperglycemia: Secondary | ICD-10-CM

## 2022-07-16 DIAGNOSIS — M50121 Cervical disc disorder at C4-C5 level with radiculopathy: Secondary | ICD-10-CM

## 2022-07-16 DIAGNOSIS — M545 Low back pain, unspecified: Secondary | ICD-10-CM

## 2022-07-16 DIAGNOSIS — I10 Essential (primary) hypertension: Secondary | ICD-10-CM

## 2022-07-16 DIAGNOSIS — N390 Urinary tract infection, site not specified: Secondary | ICD-10-CM | POA: Insufficient documentation

## 2022-07-16 MED ORDER — HYDROCODONE-ACETAMINOPHEN 5-325 MG PO TABS: 1.0000 | ORAL_TABLET | Freq: Four times a day (QID) | ORAL | 0 refills | Status: DC | PRN
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
## 2022-07-16 NOTE — Assessment & Plan Note (Signed)
Cont on Olmesart-Amlodipine-HCT
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
## 2022-07-16 NOTE — Assessment & Plan Note (Signed)
Worse on steroids
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
## 2022-07-21 ENCOUNTER — Encounter: Payer: Self-pay | Admitting: Internal Medicine

## 2022-07-21 NOTE — Telephone Encounter (Signed)
Medication is not on med list. Please advise./lmb

## 2022-07-23 ENCOUNTER — Other Ambulatory Visit: Payer: Self-pay | Admitting: Internal Medicine

## 2022-07-23 MED ORDER — CYCLOBENZAPRINE HCL 5 MG PO TABS: 5.0000 mg | ORAL_TABLET | Freq: Three times a day (TID) | ORAL | 1 refills | Status: DC | PRN

## 2022-07-29 ENCOUNTER — Ambulatory Visit: Payer: 59 | Attending: Student | Admitting: Physical Therapy

## 2022-07-29 ENCOUNTER — Encounter: Payer: Self-pay | Admitting: Physical Therapy

## 2022-07-29 DIAGNOSIS — M6281 Muscle weakness (generalized): Secondary | ICD-10-CM | POA: Diagnosis present

## 2022-07-29 DIAGNOSIS — R252 Cramp and spasm: Secondary | ICD-10-CM | POA: Diagnosis present

## 2022-07-29 DIAGNOSIS — R209 Unspecified disturbances of skin sensation: Secondary | ICD-10-CM | POA: Insufficient documentation

## 2022-07-29 DIAGNOSIS — M5412 Radiculopathy, cervical region: Secondary | ICD-10-CM | POA: Diagnosis present
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
## 2022-08-04 ENCOUNTER — Encounter: Payer: Self-pay | Admitting: Physical Therapy

## 2022-08-04 ENCOUNTER — Ambulatory Visit: Payer: 59 | Admitting: Physical Therapy

## 2022-08-04 DIAGNOSIS — R209 Unspecified disturbances of skin sensation: Secondary | ICD-10-CM

## 2022-08-04 DIAGNOSIS — R252 Cramp and spasm: Secondary | ICD-10-CM

## 2022-08-04 DIAGNOSIS — M6281 Muscle weakness (generalized): Secondary | ICD-10-CM

## 2022-08-04 DIAGNOSIS — M5412 Radiculopathy, cervical region: Secondary | ICD-10-CM | POA: Diagnosis not present
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
## 2022-08-05 ENCOUNTER — Ambulatory Visit: Payer: 59 | Admitting: Physical Therapy

## 2022-08-05 ENCOUNTER — Encounter: Payer: Self-pay | Admitting: Physical Therapy

## 2022-08-05 DIAGNOSIS — R252 Cramp and spasm: Secondary | ICD-10-CM

## 2022-08-05 DIAGNOSIS — M5412 Radiculopathy, cervical region: Secondary | ICD-10-CM

## 2022-08-05 DIAGNOSIS — R209 Unspecified disturbances of skin sensation: Secondary | ICD-10-CM

## 2022-08-05 DIAGNOSIS — M6281 Muscle weakness (generalized): Secondary | ICD-10-CM
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
## 2022-08-11 ENCOUNTER — Ambulatory Visit: Payer: 59

## 2022-08-11 DIAGNOSIS — M5412 Radiculopathy, cervical region: Secondary | ICD-10-CM

## 2022-08-11 DIAGNOSIS — R252 Cramp and spasm: Secondary | ICD-10-CM

## 2022-08-11 DIAGNOSIS — M6281 Muscle weakness (generalized): Secondary | ICD-10-CM

## 2022-08-11 DIAGNOSIS — R209 Unspecified disturbances of skin sensation: Secondary | ICD-10-CM
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
## 2022-08-12 ENCOUNTER — Ambulatory Visit: Payer: 59

## 2022-08-12 DIAGNOSIS — R252 Cramp and spasm: Secondary | ICD-10-CM

## 2022-08-12 DIAGNOSIS — M5412 Radiculopathy, cervical region: Secondary | ICD-10-CM

## 2022-08-12 DIAGNOSIS — M6281 Muscle weakness (generalized): Secondary | ICD-10-CM

## 2022-08-12 DIAGNOSIS — R209 Unspecified disturbances of skin sensation: Secondary | ICD-10-CM
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
## 2022-08-18 ENCOUNTER — Ambulatory Visit: Payer: 59 | Attending: Student

## 2022-08-18 DIAGNOSIS — R252 Cramp and spasm: Secondary | ICD-10-CM | POA: Insufficient documentation

## 2022-08-18 DIAGNOSIS — R209 Unspecified disturbances of skin sensation: Secondary | ICD-10-CM | POA: Insufficient documentation

## 2022-08-18 DIAGNOSIS — M6281 Muscle weakness (generalized): Secondary | ICD-10-CM | POA: Insufficient documentation

## 2022-08-18 DIAGNOSIS — M5412 Radiculopathy, cervical region: Secondary | ICD-10-CM | POA: Diagnosis present
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
## 2022-08-19 ENCOUNTER — Ambulatory Visit: Payer: 59

## 2022-08-19 DIAGNOSIS — M5412 Radiculopathy, cervical region: Secondary | ICD-10-CM

## 2022-08-19 DIAGNOSIS — R209 Unspecified disturbances of skin sensation: Secondary | ICD-10-CM

## 2022-08-19 DIAGNOSIS — M6281 Muscle weakness (generalized): Secondary | ICD-10-CM

## 2022-08-19 DIAGNOSIS — R252 Cramp and spasm: Secondary | ICD-10-CM
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
## 2022-08-25 ENCOUNTER — Ambulatory Visit: Payer: 59

## 2022-08-25 DIAGNOSIS — M5412 Radiculopathy, cervical region: Secondary | ICD-10-CM

## 2022-08-25 DIAGNOSIS — M6281 Muscle weakness (generalized): Secondary | ICD-10-CM

## 2022-08-25 DIAGNOSIS — R252 Cramp and spasm: Secondary | ICD-10-CM
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
## 2022-08-26 ENCOUNTER — Ambulatory Visit: Payer: 59

## 2022-08-26 DIAGNOSIS — M5412 Radiculopathy, cervical region: Secondary | ICD-10-CM

## 2022-08-26 DIAGNOSIS — M6281 Muscle weakness (generalized): Secondary | ICD-10-CM

## 2022-08-26 DIAGNOSIS — R252 Cramp and spasm: Secondary | ICD-10-CM
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
## 2022-09-02 ENCOUNTER — Ambulatory Visit: Payer: 59

## 2022-09-02 DIAGNOSIS — R252 Cramp and spasm: Secondary | ICD-10-CM

## 2022-09-02 DIAGNOSIS — M6281 Muscle weakness (generalized): Secondary | ICD-10-CM

## 2022-09-02 DIAGNOSIS — R209 Unspecified disturbances of skin sensation: Secondary | ICD-10-CM

## 2022-09-02 DIAGNOSIS — M5412 Radiculopathy, cervical region: Secondary | ICD-10-CM
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
## 2022-09-03 ENCOUNTER — Ambulatory Visit: Payer: 59

## 2022-09-03 DIAGNOSIS — R209 Unspecified disturbances of skin sensation: Secondary | ICD-10-CM

## 2022-09-03 DIAGNOSIS — M5412 Radiculopathy, cervical region: Secondary | ICD-10-CM

## 2022-09-03 DIAGNOSIS — M6281 Muscle weakness (generalized): Secondary | ICD-10-CM

## 2022-09-03 DIAGNOSIS — R252 Cramp and spasm: Secondary | ICD-10-CM
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
## 2022-09-04 ENCOUNTER — Other Ambulatory Visit: Payer: Self-pay | Admitting: Internal Medicine
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
## 2022-09-08 ENCOUNTER — Ambulatory Visit: Payer: 59

## 2022-09-08 DIAGNOSIS — M5412 Radiculopathy, cervical region: Secondary | ICD-10-CM

## 2022-09-08 DIAGNOSIS — M6281 Muscle weakness (generalized): Secondary | ICD-10-CM | POA: Diagnosis not present

## 2022-09-08 DIAGNOSIS — R252 Cramp and spasm: Secondary | ICD-10-CM

## 2022-09-08 DIAGNOSIS — R209 Unspecified disturbances of skin sensation: Secondary | ICD-10-CM

## 2022-09-24 ENCOUNTER — Telehealth: Payer: Self-pay | Admitting: Internal Medicine

## 2022-09-24 NOTE — Telephone Encounter (Signed)
Pt came in and dropped off a [employer] claim form that she needs Dr. Camila Li to sign and it can be faxed when done. Form is in Dr. Plot's basket at the front.

## 2022-09-25 NOTE — Telephone Encounter (Signed)
Rec'd form place in MD purple folder to complete../lmb 

## 2022-09-27 NOTE — Telephone Encounter (Signed)
Okay.  Thanks.

## 2022-09-28 DIAGNOSIS — Z0289 Encounter for other administrative examinations: Secondary | ICD-10-CM
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
## 2022-09-29 NOTE — Telephone Encounter (Signed)
Done. Thanks.

## 2022-09-29 NOTE — Telephone Encounter (Signed)
Notified pt form is ready for pick-up, also fax form to fax # on form...Chryl Heck

## 2022-10-13 ENCOUNTER — Ambulatory Visit: Payer: 59 | Admitting: Internal Medicine

## 2022-10-13 ENCOUNTER — Encounter: Payer: Self-pay | Admitting: Internal Medicine

## 2022-10-13 DIAGNOSIS — E1165 Type 2 diabetes mellitus with hyperglycemia: Secondary | ICD-10-CM | POA: Diagnosis not present

## 2022-10-13 DIAGNOSIS — B356 Tinea cruris: Secondary | ICD-10-CM

## 2022-10-13 MED ORDER — FLUCONAZOLE 100 MG PO TABS: ORAL_TABLET | ORAL | 1 refills | Status: DC

## 2022-10-13 MED ORDER — KETOCONAZOLE 2 % EX CREA: 1.0000 | TOPICAL_CREAM | Freq: Every day | CUTANEOUS | 1 refills | Status: DC

## 2022-10-13 MED ORDER — REPAGLINIDE 1 MG PO TABS
1.0000 mg | ORAL_TABLET | Freq: Three times a day (TID) | ORAL | 11 refills | Status: DC
Start: 2022-10-13 — End: 2022-12-25

## 2022-10-13 NOTE — Assessment & Plan Note (Signed)
New Start Diflucan po Ketoconazole cream Treat DM
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
## 2022-10-13 NOTE — Assessment & Plan Note (Signed)
Worse Cont Metformin, Trulicity Add Prandin Endo appt in Jan 2024

## 2022-12-02 LAB — HM DIABETES EYE EXAM

## 2022-12-03 ENCOUNTER — Encounter: Payer: Self-pay | Admitting: Internal Medicine

## 2022-12-25 ENCOUNTER — Ambulatory Visit: Payer: 59 | Admitting: Internal Medicine

## 2022-12-25 ENCOUNTER — Encounter: Payer: Self-pay | Admitting: Internal Medicine

## 2022-12-25 VITALS — BP 134/82 | HR 74 | Ht 63.0 in | Wt 213.0 lb

## 2022-12-25 DIAGNOSIS — E1165 Type 2 diabetes mellitus with hyperglycemia: Secondary | ICD-10-CM | POA: Diagnosis not present

## 2022-12-25 LAB — POCT GLYCOSYLATED HEMOGLOBIN (HGB A1C): Hemoglobin A1C: 13.2 % — AB (ref 4.0–5.6)

## 2022-12-25 LAB — POCT GLUCOSE (DEVICE FOR HOME USE): POC Glucose: 306 mg/dl — AB (ref 70–99)

## 2022-12-25 MED ORDER — TRULICITY 3 MG/0.5ML ~~LOC~~ SOAJ: 3.0000 mg | SUBCUTANEOUS | 3 refills | Status: DC

## 2022-12-25 MED ORDER — LANTUS SOLOSTAR 100 UNIT/ML ~~LOC~~ SOPN: 20.0000 [IU] | PEN_INJECTOR | Freq: Every day | SUBCUTANEOUS | 6 refills | Status: DC

## 2022-12-25 MED ORDER — INSULIN PEN NEEDLE 32G X 4 MM MISC: 1.0000 | Freq: Every day | 3 refills | Status: DC

## 2022-12-25 NOTE — Progress Notes (Unsigned)
Name: [name] [name]  Age/ Sex: 52 y.o., female   MRN/ DOB: [id number], 10-31-1971     PCP: Cassandria Anger, MD   Reason for Endocrinology Evaluation: Type 2 Diabetes Mellitus  Initial Endocrine Consultative Visit: 03/07/2014    PATIENT IDENTIFIER: [name] [name] is a 52 y.o. female with a past medical history of DM, HTN . The patient has followed with Endocrinology clinic since 03/07/2014 for consultative assistance with management of her diabetes.  DIABETIC HISTORY:  Ms. Tumlin was diagnosed with DM 2011. Her hemoglobin A1c has ranged from 7.1% in 2020, peaking at 13.2% in 2024.   Pt also has small multinodular goiter (dx'ed 2015; US showed the nodules too small to merit bx; f/u US in 2020 showed no signif change, and no need for f/u; she has been euthyroid on no rx).  She denies neck swelling.   She had follow up with Dr. Loanne Drilling  SUBJECTIVE:   During the last visit (04/05/2020): Saw Dr. Loanne Drilling   Today (12/25/2022): [name] [name]  is here for a follow up on diabetes management. She has has NOT been to our clinic in almost 3 years. She checks her blood sugars 1 time a week  The patient has not had hypoglycemic episodes since the last clinic visit.  She avoids sugar-sweetened beverages  She is had stopped metformin and repaglinide due to beeing on "too much " medications  Denies nausea, vomiting or diarrhea  She eats 2 meals a day, snack occasionally.   She has depression , she sees a counselor   HOME DIABETES REGIMEN:  Metformin 500 mg 2 tabs BID - not taking  Repaglinide 1 mg TIDQAC- not taking  Jardiance 25 mg daily  Trulicity 3 mg daily   Statin: no ACE-I/ARB: no    METER DOWNLOAD SUMMARY: Did not bring    DIABETIC COMPLICATIONS: Microvascular complications:   Denies: CKD, retinopathy, neuropathy  Last Eye Exam: Completed 11/2022  Macrovascular complications:   Denies: CAD, CVA, PVD   HISTORY:  Past Medical History:  Past Medical History:   Diagnosis Date   Anemia, iron deficiency    Chronic headache    Dr Melton Alar, Neg MRI 2005   Diabetes mellitus    Elevated glucose 2011   GERD (gastroesophageal reflux disease)    HTN (hypertension)    Past Surgical History:  Past Surgical History:  Procedure Laterality Date   ABDOMINAL HYSTERECTOMY     BREAST REDUCTION SURGERY     Cyst removed from vagina     INDUCED ABORTION  1990-1994   x 4   NOSE SURGERY     PARTIAL HYSTERECTOMY  2010   TUBAL LIGATION     Social History:  reports that she has never smoked. She has never used smokeless tobacco. She reports current alcohol use. She reports that she does not use drugs. Family History:  Family History  Problem Relation Age of Onset   Hypertension Mother    Esophageal cancer Mother 24   Healthy Father    Hypertension Maternal Grandmother    Stroke Maternal Grandmother    Ovarian cancer Sister    Prostate cancer Maternal Grandfather      HOME MEDICATIONS: Allergies as of 12/25/2022       Reactions   Ciprofloxacin    REACTION: gittery   Tramadol Other (See Comments)   Headaches worse        Medication List        Accurate as of December 25, 2022  1:57  PM. If you have any questions, ask your nurse or doctor.          STOP taking these medications    fluconazole 100 MG tablet Commonly known as: Diflucan Stopped by: Dorita Sciara, MD       TAKE these medications    acetaminophen 500 MG tablet Commonly known as: TYLENOL Take 2 tablets (1,000 mg total) by mouth every 6 (six) hours as needed.   budesonide-formoterol 160-4.5 MCG/ACT inhaler Commonly known as: Symbicort Inhale 2 puffs into the lungs 2 (two) times daily.   cyclobenzaprine 5 MG tablet Commonly known as: FLEXERIL Take 1 tablet (5 mg total) by mouth 3 (three) times daily as needed for muscle spasms.   desonide 0.05 % cream Commonly known as: DESOWEN Apply 1 Application topically 2 (two) times daily.   empagliflozin 25 MG Tabs  tablet Commonly known as: Jardiance Take 1 tablet (25 mg total) by mouth daily.   escitalopram 5 MG tablet Commonly known as: Lexapro Take 1 tablet (5 mg total) by mouth at bedtime.   fluticasone 50 MCG/ACT nasal spray Commonly known as: FLONASE Place 2 sprays into both nostrils daily.   HYDROcodone-acetaminophen 5-325 MG tablet Commonly known as: NORCO/VICODIN Take 1 tablet by mouth every 6 (six) hours as needed for severe pain.   ketoconazole 2 % cream Commonly known as: NIZORAL Apply 1 Application topically daily.   metFORMIN 500 MG tablet Commonly known as: Glucophage Take 2 tablets (1,000 mg total) by mouth 2 (two) times daily with a meal.   naproxen 500 MG tablet Commonly known as: NAPROSYN Take 1 tablet (500 mg total) by mouth 2 (two) times daily with a meal.   Olmesartan-amLODIPine-HCTZ 40-10-25 MG Tabs TAKE 1 TABLET BY MOUTH DAILY   omeprazole 20 MG capsule Commonly known as: PRILOSEC Take 1 capsule (20 mg total) by mouth daily.   OneTouch Verio test strip Generic drug: glucose blood 1 each by Other route daily. Use as instructed   repaglinide 1 MG tablet Commonly known as: PRANDIN Take 1 tablet (1 mg total) by mouth 3 (three) times daily before meals.   sucralfate 1 GM/10ML suspension Commonly known as: Carafate Take 10 mLs (1 g total) by mouth 4 (four) times daily -  with meals and at bedtime.   triamcinolone cream 0.1 % Commonly known as: KENALOG Apply 1 Application topically 2 (two) times daily.   Trulicity 3 EX/5.2WU Sopn Generic drug: Dulaglutide Inject 3 mg into the skin once a week.   Vitamin D3 50 MCG (2000 UT) capsule Take 1 capsule (2,000 Units total) by mouth daily.         OBJECTIVE:   Vital Signs: BP 134/82 (BP Location: Left Arm, Patient Position: Sitting, Cuff Size: Large)   Pulse 74   Ht 5\' 3"  (1.6 m)   Wt 213 lb (96.6 kg)   SpO2 98%   BMI 37.73 kg/m   Wt Readings from Last 3 Encounters:  12/25/22 213 lb (96.6 kg)   10/13/22 223 lb 9.6 oz (101.4 kg)  07/14/22 220 lb (99.8 kg)     Exam: General: Pt appears well and is in NAD  Neck: General: Supple without adenopathy. Thyroid: Thyroid size normal.  No goiter or nodules appreciated.   Lungs: Clear with good BS bilat   Heart: RRR   Abdomen:  soft, nontender  Extremities: No pretibial edema.   Neuro: MS is good with appropriate affect, pt is alert and Ox3      DATA REVIEWED:  Lab Results  Component Value Date   HGBA1C 13.2 (A) 12/25/2022   HGBA1C 12.2 (H) 03/17/2022   HGBA1C 9.5 (H) 08/14/2021    Latest Reference Range & Units 03/17/22 15:34  Sodium 135 - 145 mEq/L 136  Potassium 3.5 - 5.1 mEq/L 3.9  Chloride 96 - 112 mEq/L 95 (L)  CO2 19 - 32 mEq/L 32  Glucose 70 - 99 mg/dL 161 (H)  BUN 6 - 23 mg/dL 23  Creatinine 0.96 - 0.45 mg/dL 4.09  Calcium 8.4 - 81.1 mg/dL 91.4  Alkaline Phosphatase 39 - 117 U/L 87  Albumin 3.5 - 5.2 g/dL 4.6  AST 0 - 37 U/L 15  ALT 0 - 35 U/L 23  Total Protein 6.0 - 8.3 g/dL 8.5 (H)  Total Bilirubin 0.2 - 1.2 mg/dL 0.4  GFR >78.29 mL/min 64.58   Old records , labs and images have been reviewed.   ASSESSMENT / PLAN / RECOMMENDATIONS:   1) Type 2 Diabetes Mellitus, Poorly controlled, Without  complications - Most recent A1c of 13.2 %. Goal A1c < 7.0 %.    -Poorly controlled diabetes due to suboptimal medical management, given A1c > 10.0%, I have recommended starting her on basal insulin -Discussed importance of avoiding sugar sweetened beverages and low-carb diet -Discussed microvascular complications to include blindness, ESRD, and increased risk of amputation -She is in agreement of starting insulin, she will gradually increase the dose to a maximum of 20 units daily -Barrier to diabetes self-care is depression  MEDICATIONS: Start Lantus 10 units daily, increasing the dose every 4 days to a maximum of 20 units daily Continue Trulicity 3 mg weekly  EDUCATION / INSTRUCTIONS: BG monitoring  instructions: Patient is instructed to check her blood sugars 1 times a day, fasting. Call [place] [facility] if: BG persistently < 70  I reviewed the Rule of 15 for the treatment of hypoglycemia in detail with the patient. Literature supplied.    2) Diabetic complications:  Eye: Does not have known diabetic retinopathy.  Neuro/ Feet: Does not have known diabetic peripheral neuropathy .  Renal: Patient does not have known baseline CKD. She   is not on an ACEI/ARB at present.     F/U in 4 months     Signed electronically by: Lyndle Herrlich, MD  [facility]  [facility] [address] [place], [place] [address] Phone: [phone] FAX: [phone]   CC: Tresa Garter, MD [address] [place] [address] Phone: [phone]  Fax: [phone]  Return to Endocrinology clinic as below: No future appointments.

## 2022-12-25 NOTE — Patient Instructions (Addendum)
Start lantus 10 units for 4 days , than increase to 14 units for another 4 days, then increase to 20 units daily   Continue Trulicity 3 mg weekly     HOW TO TREAT LOW BLOOD SUGARS (Blood sugar LESS THAN 70 MG/DL) Please follow the RULE OF 15 for the treatment of hypoglycemia treatment (when your (blood sugars are less than 70 mg/dL)   STEP 1: Take 15 grams of carbohydrates when your blood sugar is low, which includes:  3-4 GLUCOSE TABS  OR 3-4 OZ OF JUICE OR REGULAR SODA OR ONE TUBE OF GLUCOSE GEL    STEP 2: RECHECK blood sugar in 15 MINUTES STEP 3: If your blood sugar is still low at the 15 minute recheck --> then, go back to STEP 1 and treat AGAIN with another 15 grams of carbohydrates.

## 2022-12-27 ENCOUNTER — Ambulatory Visit (HOSPITAL_COMMUNITY)
Admission: EM | Admit: 2022-12-27 | Discharge: 2022-12-27 | Disposition: A | Discharge status: Home / Self Care | Payer: 59 | Attending: Emergency Medicine | Admitting: Emergency Medicine

## 2022-12-27 ENCOUNTER — Encounter (HOSPITAL_COMMUNITY): Payer: Self-pay | Admitting: Emergency Medicine

## 2022-12-27 DIAGNOSIS — N898 Other specified noninflammatory disorders of vagina: Secondary | ICD-10-CM | POA: Insufficient documentation
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
## 2022-12-27 NOTE — Discharge Instructions (Signed)
We will call you if anything on your swab returns positive. Please abstain from sexual intercourse until your results return. 

## 2022-12-27 NOTE — ED Triage Notes (Signed)
Since Friday having vaginal discharge and itching. Reports that when stands up with have lot of liquid come out.

## 2022-12-27 NOTE — ED Notes (Signed)
At bedside for provider examination of patient genitalia.  Vaginal swab obtained, labeled at bedside and placed in lab

## 2022-12-28 LAB — CERVICOVAGINAL ANCILLARY ONLY
Bacterial Vaginitis (gardnerella): POSITIVE — AB
Candida Glabrata: NEGATIVE
Candida Vaginitis: NEGATIVE
Chlamydia: NEGATIVE
Comment: NEGATIVE
Comment: NEGATIVE
Comment: NEGATIVE
Comment: NEGATIVE
Comment: NEGATIVE
Comment: NORMAL
Neisseria Gonorrhea: NEGATIVE
Trichomonas: POSITIVE — AB

## 2022-12-29 ENCOUNTER — Telehealth (HOSPITAL_COMMUNITY): Payer: Self-pay | Admitting: Emergency Medicine

## 2022-12-29 MED ORDER — METRONIDAZOLE 500 MG PO TABS: 500.0000 mg | ORAL_TABLET | Freq: Two times a day (BID) | ORAL | 0 refills | Status: DC

## 2023-05-26 ENCOUNTER — Ambulatory Visit: Payer: 59 | Admitting: Internal Medicine

## 2023-05-26 ENCOUNTER — Other Ambulatory Visit: Payer: Self-pay

## 2023-05-26 ENCOUNTER — Encounter: Payer: Self-pay | Admitting: Internal Medicine

## 2023-05-26 VITALS — BP 124/80 | HR 100 | Ht 63.0 in | Wt 218.0 lb

## 2023-05-26 DIAGNOSIS — E1165 Type 2 diabetes mellitus with hyperglycemia: Secondary | ICD-10-CM

## 2023-05-26 DIAGNOSIS — Z7985 Long-term (current) use of injectable non-insulin antidiabetic drugs: Secondary | ICD-10-CM

## 2023-05-26 DIAGNOSIS — Z794 Long term (current) use of insulin: Secondary | ICD-10-CM | POA: Diagnosis not present

## 2023-05-26 LAB — POCT GLUCOSE (DEVICE FOR HOME USE): POC Glucose: 302 mg/dl — AB (ref 70–99)

## 2023-05-26 LAB — POCT GLYCOSYLATED HEMOGLOBIN (HGB A1C): Hemoglobin A1C: 13.3 % — AB (ref 4.0–5.6)

## 2023-05-26 MED ORDER — TIRZEPATIDE 5 MG/0.5ML ~~LOC~~ SOAJ
5.0000 mg | SUBCUTANEOUS | 3 refills | Status: DC
  Filled 2023-05-26: qty 2, 28d supply, fill #0
  Filled 2023-06-30 – 2023-07-12 (×2): qty 2, 28d supply, fill #1
  Filled 2023-08-20: qty 2, 28d supply, fill #2
  Filled 2023-09-21: qty 2, 28d supply, fill #3
  Filled 2023-12-01: qty 2, 28d supply, fill #4
  Filled 2024-01-15: qty 2, 28d supply, fill #5
  Filled 2024-03-10: qty 2, 28d supply, fill #6

## 2023-05-26 MED ORDER — INSULIN PEN NEEDLE 32G X 4 MM MISC: 1.0000 | Freq: Every day | 3 refills | Status: DC

## 2023-05-26 MED ORDER — LANTUS SOLOSTAR 100 UNIT/ML ~~LOC~~ SOPN: 24.0000 [IU] | PEN_INJECTOR | Freq: Every day | SUBCUTANEOUS | 3 refills | Status: DC

## 2023-05-26 NOTE — Patient Instructions (Addendum)
Stop Trulicity Start Mounjaro 5 mg weekly Increase  lantus 24  units once daily   HOW TO TREAT LOW BLOOD SUGARS (Blood sugar LESS THAN 70 MG/DL) Please follow the RULE OF 15 for the treatment of hypoglycemia treatment (when your (blood sugars are less than 70 mg/dL)   STEP 1: Take 15 grams of carbohydrates when your blood sugar is low, which includes:  3-4 GLUCOSE TABS  OR 3-4 OZ OF JUICE OR REGULAR SODA OR ONE TUBE OF GLUCOSE GEL    STEP 2: RECHECK blood sugar in 15 MINUTES STEP 3: If your blood sugar is still low at the 15 minute recheck --> then, go back to STEP 1 and treat AGAIN with another 15 grams of carbohydrates.

## 2023-05-26 NOTE — Progress Notes (Signed)
Name: [name] [name]  Age/ Sex: 52 y.o., female   MRN/ DOB: [id number], 09/13/1971     PCP: Tresa Garter, MD   Reason for Endocrinology Evaluation: Type 2 Diabetes Mellitus  Initial Endocrine Consultative Visit: 03/07/2014    PATIENT IDENTIFIER: [name] is a 52 y.o. female with a past medical history of DM, HTN . The patient has followed with Endocrinology clinic since 03/07/2014 for consultative assistance with management of her diabetes.  DIABETIC HISTORY:  [name] [name] was diagnosed with DM 2011. Her hemoglobin A1c has ranged from 7.1% in 2020, peaking at 13.2% in 2024.   Pt also has small multinodular goiter (dx'ed 2015; US showed the nodules too small to merit bx; f/u US in 2020 showed no signif change, and no need for f/u; she has been euthyroid on no rx).  She denies neck swelling.   She had follow up with Dr. Everardo All    Initial visit with me she had an A1c of 15.2% 12/2022 , she was on Trulicity only, as she has self discontinued metformin, Jardiance and repaglinide ,we started Lantus SUBJECTIVE:   During the last visit (12/25/2022): A1c 13.2%     Today (05/26/2023): [name] [name]  is here for a follow up on diabetes management. She has not checked glucose in a month after losing her meter.   She took a trip to [place] and [place] and was without Trulicity for 2 weeks , she was also without lantus for 1-2 weeks    She had an ED visit for vaginal discharge She has depression , she sees a counselor   Denies nausea or vomiting  Denies constipation diarrhea   HOME DIABETES REGIMEN:  Trulicity 3 mg daily  Lantus 20 units daily      Statin: no ACE-I/ARB: no    METER DOWNLOAD SUMMARY: Did not bring  122- 437 mg/dL   DIABETIC COMPLICATIONS: Microvascular complications:   Denies: CKD, retinopathy, neuropathy  Last Eye Exam: Completed 11/2022  Macrovascular complications:   Denies: CAD, CVA, PVD   HISTORY:  Past Medical History:  Past Medical  History:  Diagnosis Date   Anemia, iron deficiency    Chronic headache    Dr Vela Prose, Neg MRI 2005   Diabetes mellitus    Elevated glucose 2011   GERD (gastroesophageal reflux disease)    HTN (hypertension)    Past Surgical History:  Past Surgical History:  Procedure Laterality Date   ABDOMINAL HYSTERECTOMY     BREAST REDUCTION SURGERY     Cyst removed from vagina     INDUCED ABORTION  1990-1994   x 4   NOSE SURGERY     PARTIAL HYSTERECTOMY  2010   TUBAL LIGATION     Social History:  reports that she has never smoked. She has never used smokeless tobacco. She reports current alcohol use. She reports that she does not use drugs. Family History:  Family History  Problem Relation Age of Onset   Hypertension Mother    Esophageal cancer Mother 27   Healthy Father    Hypertension Maternal Grandmother    Stroke Maternal Grandmother    Ovarian cancer Sister    Prostate cancer Maternal Grandfather      HOME MEDICATIONS: Allergies as of 05/26/2023       Reactions   Ciprofloxacin    REACTION: gittery   Tramadol Other (See Comments)   Headaches worse        Medication List  Accurate as of May 26, 2023  2:54 PM. If you have any questions, ask your nurse or doctor.          STOP taking these medications    metroNIDAZOLE 500 MG tablet Commonly known as: FLAGYL Stopped by: Scarlette Shorts, MD       TAKE these medications    acetaminophen 500 MG tablet Commonly known as: TYLENOL Take 2 tablets (1,000 mg total) by mouth every 6 (six) hours as needed.   budesonide-formoterol 160-4.5 MCG/ACT inhaler Commonly known as: Symbicort Inhale 2 puffs into the lungs 2 (two) times daily.   escitalopram 5 MG tablet Commonly known as: Lexapro Take 1 tablet (5 mg total) by mouth at bedtime.   Insulin Pen Needle 32G X 4 MM Misc 1 Device by Does not apply route daily in the afternoon.   Lantus SoloStar 100 UNIT/ML Solostar Pen Generic drug: insulin  glargine Inject 20 Units into the skin daily.   Trulicity 3 MG/0.5ML Sopn Generic drug: Dulaglutide Inject 3 mg into the skin once a week.         OBJECTIVE:   Vital Signs: BP 124/80 (BP Location: Left Arm, Patient Position: Sitting, Cuff Size: Large)   Pulse 100   Ht 5\' 3"  (1.6 m)   Wt 218 lb (98.9 kg)   SpO2 99%   BMI 38.62 kg/m   Wt Readings from Last 3 Encounters:  05/26/23 218 lb (98.9 kg)  12/25/22 213 lb (96.6 kg)  10/13/22 223 lb 9.6 oz (101.4 kg)     Exam: General: Pt appears well and is in NAD  Neck: General: Supple without adenopathy. Thyroid: Thyroid size normal.  No goiter or nodules appreciated.   Lungs: Clear with good BS bilat   Heart: RRR   Abdomen:  soft, nontender  Extremities: No pretibial edema.   Neuro: MS is good with appropriate affect, pt is alert and Ox3   DM Foot Exam 05/26/2023  The skin of the feet is intact without sores or ulcerations. The pedal pulses are 2+ on right and 2+ on left. The sensation is intact to a screening 5.07, 10 gram monofilament bilaterally    DATA REVIEWED:  Lab Results  Component Value Date   HGBA1C 13.3 (A) 05/26/2023   HGBA1C 13.2 (A) 12/25/2022   HGBA1C 12.2 (H) 03/17/2022   ASSESSMENT / PLAN / RECOMMENDATIONS:   1) Type 2 Diabetes Mellitus, Poorly controlled, Without  complications - Most recent A1c of 13.3 %. Goal A1c < 7.0 %.     -Despite adding 20 units of Lantus her A1c continues to be > 13.0%, which makes me question her intake of Lantus -She has been going on multiple trips since last visit here, I also suspect dietary indiscretions -I have recommended switching Trulicity to [facility] -I will increase Lantus as below    MEDICATIONS: Increase Lantus 24 units daily Stop Trulicity Start Mounjaro 5 mg weekly  EDUCATION / INSTRUCTIONS: BG monitoring instructions: Patient is instructed to check her blood sugars 1 times a day, fasting. Call [facility] if: BG persistently <  70  I reviewed the Rule of 15 for the treatment of hypoglycemia in detail with the patient. Literature supplied.    2) Diabetic complications:  Eye: Does not have known diabetic retinopathy.  Neuro/ Feet: Does not have known diabetic peripheral neuropathy .  Renal: Patient does not have known baseline CKD. She   is not on an ACEI/ARB at present.     F/U in 3  months     Signed electronically by: Lyndle Herrlich, MD  [facility] Endocrinology  [facility] [address] [place], [place] [address] Phone: [phone] FAX: [phone]   CC: Tresa Garter, MD [address] [place] [address] Phone: [phone]  Fax: [phone]  Return to Endocrinology clinic as below: No future appointments.

## 2023-07-06 ENCOUNTER — Other Ambulatory Visit: Payer: Self-pay

## 2023-07-12 ENCOUNTER — Other Ambulatory Visit: Payer: Self-pay

## 2023-07-28 ENCOUNTER — Telehealth: Payer: 59 | Admitting: Physician Assistant

## 2023-07-28 DIAGNOSIS — R6889 Other general symptoms and signs: Secondary | ICD-10-CM

## 2023-07-28 MED ORDER — OSELTAMIVIR PHOSPHATE 75 MG PO CAPS: 75.0000 mg | ORAL_CAPSULE | Freq: Two times a day (BID) | ORAL | 0 refills | Status: DC

## 2023-07-28 MED ORDER — BENZONATATE 100 MG PO CAPS: 100.0000 mg | ORAL_CAPSULE | Freq: Three times a day (TID) | ORAL | 0 refills | Status: DC | PRN

## 2023-07-28 NOTE — Patient Instructions (Signed)
Yvette Rack, thank you for joining [name], PA-C for today's virtual visit.  While this provider is not your primary care provider (PCP), if your PCP is located in our provider database this encounter information will be shared with them immediately following your visit.   A [facility] MyChart account gives you access to today's visit and all your visits, tests, and labs performed at [facility] " click here if you don't have a [facility] MyChart account or go to [email or web address]  Consent: (Patient) Yvette Rack provided verbal consent for this virtual visit at the beginning of the encounter.  Current Medications:  Current Outpatient Medications:    benzonatate (TESSALON) 100 MG capsule, Take 1 capsule (100 mg total) by mouth 3 (three) times daily as needed for cough., Disp: 30 capsule, Rfl: 0   oseltamivir (TAMIFLU) 75 MG capsule, Take 1 capsule (75 mg total) by mouth 2 (two) times daily., Disp: 10 capsule, Rfl: 0   acetaminophen (TYLENOL) 500 MG tablet, Take 2 tablets (1,000 mg total) by mouth every 6 (six) hours as needed., Disp: 30 tablet, Rfl: 0   budesonide-formoterol (SYMBICORT) 160-4.5 MCG/ACT inhaler, Inhale 2 puffs into the lungs 2 (two) times daily. (Patient not taking: Reported on 12/25/2022), Disp: 1 each, Rfl: 5   escitalopram (LEXAPRO) 5 MG tablet, Take 1 tablet (5 mg total) by mouth at bedtime., Disp: 30 tablet, Rfl: 5   insulin glargine (LANTUS SOLOSTAR) 100 UNIT/ML Solostar Pen, Inject 24 Units into the skin daily., Disp: 30 mL, Rfl: 3   Insulin Pen Needle 32G X 4 MM MISC, 1 Device by Does not apply route daily in the afternoon., Disp: 100 each, Rfl: 3   tirzepatide (MOUNJARO) 5 MG/0.5ML Pen, Inject 5 mg into the skin once a week., Disp: 6 mL, Rfl: 3   Medications ordered in this encounter:  Meds ordered this encounter  Medications   benzonatate (TESSALON) 100 MG capsule    Sig: Take 1 capsule (100 mg total) by mouth 3 (three)  times daily as needed for cough.    Dispense:  30 capsule    Refill:  0    Order Specific Question:   Supervising Provider    Answer:   Merrilee Jansky [id number]   oseltamivir (TAMIFLU) 75 MG capsule    Sig: Take 1 capsule (75 mg total) by mouth 2 (two) times daily.    Dispense:  10 capsule    Refill:  0    Order Specific Question:   Supervising Provider    Answer:   Merrilee Jansky [id number]     *If you need refills on other medications prior to your next appointment, please contact your pharmacy*  Follow-Up: Call back or seek an in-person evaluation if the symptoms worsen or if the condition fails to improve as anticipated.  [facility] [phone]  Other Instructions Please keep well-hydrated and try to get plenty of rest. If you have a humidifier, place it in the bedroom and run it at night. Start a saline nasal rinse for nasal congestion. You can consider use of a nasal steroid spray like Flonase or Nasacort OTC. You can alternate between Tylenol and Ibuprofen if needed for fever, body aches, headache and/or throat pain. Salt water-gargles and chloraseptic spray can be very beneficial for sore throat. Mucinex-DM for congestion or cough. Please take all prescribed medications as directed.  Remain out of work until [employer] for 24 hours without a fever-reducing medication, and you are feeling  better.  You should mask until symptoms are resolved.  If anything worsens despite treatment, you need to be evaluated in-person. Please do not delay care.  Influenza, Adult Influenza is also called "the flu." It is an infection in the lungs, nose, and throat (respiratory tract). It spreads easily from person to person (is contagious). The flu causes symptoms that are like a cold, along with high fever and body aches. What are the causes? This condition is caused by the influenza virus. You can get the virus by: Breathing in droplets that are in the air after a person  infected with the flu coughed or sneezed. Touching something that has the virus on it and then touching your mouth, nose, or eyes. What increases the risk? Certain things may make you more likely to get the flu. These include: Not washing your hands often. Having close contact with many people during cold and flu season. Touching your mouth, eyes, or nose without first washing your hands. Not getting a flu shot every year. You may have a higher risk for the flu, and serious problems, such as a lung infection (pneumonia), if you: Are older than 65. Are pregnant. Have a weakened disease-fighting system (immune system) because of a disease or because you are taking certain medicines. Have a long-term (chronic) condition, such as: Heart, kidney, or lung disease. Diabetes. Asthma. Have a liver disorder. Are very overweight (morbidly obese). Have anemia. What are the signs or symptoms? Symptoms usually begin suddenly and last 4-14 days. They may include: Fever and chills. Headaches, body aches, or muscle aches. Sore throat. Cough. Runny or stuffy (congested) nose. Feeling discomfort in your chest. Not wanting to eat as much as normal. Feeling weak or tired. Feeling dizzy. Feeling sick to your stomach or throwing up. How is this treated? If the flu is found early, you can be treated with antiviral medicine. This can help to reduce how bad the illness is and how long it lasts. This may be given by mouth or through an IV tube. Taking care of yourself at home can help your symptoms get better. Your doctor may want you to: Take over-the-counter medicines. Drink plenty of fluids. The flu often goes away on its own. If you have very bad symptoms or other problems, you may be treated in a hospital. Follow these instructions at home:     Activity Rest as needed. Get plenty of sleep. Stay home from work or school as told by your doctor. Do not leave home until you do not have a fever for  24 hours without taking medicine. Leave home only to go to your doctor. Eating and drinking Take an ORS (oral rehydration solution). This is a drink that is sold at pharmacies and stores. Drink enough fluid to keep your pee pale yellow. Drink clear fluids in small amounts as you are able. Clear fluids include: Water. Ice chips. Fruit juice mixed with water. Low-calorie sports drinks. Eat bland foods that are easy to digest. Eat small amounts as you are able. These foods include: Bananas. Applesauce. Rice. Lean meats. Toast. Crackers. Do not eat or drink: Fluids that have a lot of sugar or caffeine. Alcohol. Spicy or fatty foods. General instructions Take over-the-counter and prescription medicines only as told by your doctor. Use a cool mist humidifier to add moisture to the air in your home. This can make it easier for you to breathe. When using a cool mist humidifier, clean it daily. Empty water and replace with clean  water. Cover your mouth and nose when you cough or sneeze. Wash your hands with soap and water often and for at least 20 seconds. This is also important after you cough or sneeze. If you cannot use soap and water, use alcohol-based hand sanitizer. Keep all follow-up visits. How is this prevented?  Get a flu shot every year. You may get the flu shot in late summer, fall, or winter. Ask your doctor when you should get your flu shot. Avoid contact with people who are sick during fall and winter. This is cold and flu season. Contact a doctor if: You get new symptoms. You have: Chest pain. Watery poop (diarrhea). A fever. Your cough gets worse. You start to have more mucus. You feel sick to your stomach. You throw up. Get help right away if you: Have shortness of breath. Have trouble breathing. Have skin or nails that turn a bluish color. Have very bad pain or stiffness in your neck. Get a sudden headache. Get sudden pain in your face or ear. Cannot eat or  drink without throwing up. These symptoms may represent a serious problem that is an emergency. Get medical help right away. Call your local emergency services (911 in the [place] Do not wait to see if the symptoms will go away. Do not drive yourself to the hospital. Summary Influenza is also called "the flu." It is an infection in the lungs, nose, and throat. It spreads easily from person to person. Take over-the-counter and prescription medicines only as told by your doctor. Getting a flu shot every year is the best way to not get the flu. This information is not intended to replace advice given to you by your health care provider. Make sure you discuss any questions you have with your health care provider. Document Revised: 07/19/2020 Document Reviewed: 07/19/2020 Elsevier Patient Education  2023 Elsevier Inc.      If you have been instructed to have an in-person evaluation today at a local Urgent Care facility, please use the link below. It will take you to a list of all of our available [facility], including address, phone number and hours of operation. Please do not delay care.  [facility]  If you or a family member do not have a primary care provider, use the link below to schedule a visit and establish care. When you choose a [facility] primary care physician or advanced practice provider, you gain a long-term partner in health. Find a Primary Care Provider  Learn more about [facility]'s in-office and virtual care options: [facility] - Get Care Now
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
## 2023-07-30 ENCOUNTER — Encounter (HOSPITAL_COMMUNITY): Payer: Self-pay

## 2023-07-30 ENCOUNTER — Telehealth: Payer: Self-pay | Admitting: Internal Medicine

## 2023-07-30 ENCOUNTER — Ambulatory Visit (HOSPITAL_COMMUNITY)
Admission: EM | Admit: 2023-07-30 | Discharge: 2023-07-30 | Disposition: A | Discharge status: Home / Self Care | Payer: 59 | Attending: Internal Medicine | Admitting: Internal Medicine

## 2023-07-30 DIAGNOSIS — Z1152 Encounter for screening for COVID-19: Secondary | ICD-10-CM | POA: Insufficient documentation

## 2023-07-30 DIAGNOSIS — B9789 Other viral agents as the cause of diseases classified elsewhere: Secondary | ICD-10-CM | POA: Insufficient documentation

## 2023-07-30 DIAGNOSIS — R059 Cough, unspecified: Secondary | ICD-10-CM | POA: Insufficient documentation

## 2023-07-30 DIAGNOSIS — J069 Acute upper respiratory infection, unspecified: Secondary | ICD-10-CM | POA: Diagnosis not present

## 2023-07-30 LAB — POCT RAPID STREP A (OFFICE): Rapid Strep A Screen: NEGATIVE

## 2023-07-30 MED ORDER — LIDOCAINE VISCOUS HCL 2 % MT SOLN: 15.0000 mL | OROMUCOSAL | 0 refills | Status: DC | PRN

## 2023-07-30 MED ORDER — BENZONATATE 100 MG PO CAPS: 100.0000 mg | ORAL_CAPSULE | Freq: Three times a day (TID) | ORAL | 0 refills | Status: DC

## 2023-07-30 NOTE — ED Triage Notes (Signed)
Pt states she was expose to the flu 3 days ago. Pt states did a virtual visit and they prescribed Tamiflu. Pt states her throat has been hurting x 3 days.
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
## 2023-07-30 NOTE — Telephone Encounter (Signed)
Pt will need follow-up appt w/ whomever that can see her. Dr. Posey Rea is not in the office until Monday../l;mb

## 2023-07-30 NOTE — Discharge Instructions (Addendum)
 Your symptoms are most likely due to a viral illness, which will improve on its own with rest and fluids.  - Take prescribed medicines to help with symptoms: tessalon perles - Use over the counter medicines to help with symptoms as discussed: Tylenol, guaifenesin (mucinex), zyrtec, etc - Two teaspoons of honey in warm water every 4-6 hours may help with throat pains - Humidifier in your room at night to help add water the air and soothe cough  If you develop any new or worsening symptoms or do not improve in the next 2 to 3 days, please return.  If your symptoms are severe, please go to the emergency room.  Follow-up with PCP as needed.

## 2023-07-30 NOTE — Telephone Encounter (Signed)
Pt called thinking she have strep throat she just had a virtual visit advise pt she should have made an OV to be tested pt stated her throat is still hurting. Please advise

## 2023-07-31 LAB — SARS CORONAVIRUS 2 (TAT 6-24 HRS): SARS Coronavirus 2: NEGATIVE

## 2023-08-01 LAB — CULTURE, GROUP A STREP (THRC)

## 2023-08-03 ENCOUNTER — Other Ambulatory Visit: Payer: Self-pay | Admitting: Internal Medicine

## 2023-08-23 ENCOUNTER — Other Ambulatory Visit: Payer: Self-pay

## 2023-08-24 ENCOUNTER — Other Ambulatory Visit: Payer: Self-pay

## 2023-08-31 ENCOUNTER — Ambulatory Visit: Payer: 59 | Admitting: Internal Medicine

## 2023-08-31 NOTE — Progress Notes (Deleted)
Name: [name] [name]  Age/ Sex: 52 y.o., female   MRN/ DOB: [id number], [birth year]     PCP: Tresa Garter, MD   Reason for Endocrinology Evaluation: Type 2 Diabetes Mellitus  Initial Endocrine Consultative Visit: 03/07/2014    PATIENT IDENTIFIER: [name] [name] is a 52 y.o. female with a past medical history of DM, HTN . The patient has followed with Endocrinology clinic since 03/07/2014 for consultative assistance with management of her diabetes.  DIABETIC HISTORY:  [name] was diagnosed with DM 2011. Her hemoglobin A1c has ranged from 7.1% in 2020, peaking at 13.2% in 2024.   Pt also has small multinodular goiter (dx'ed 2015; US showed the nodules too small to merit bx; f/u US in 2020 showed no signif change, and no need for f/u; she has been euthyroid on no rx).  She denies neck swelling.   She had follow up with Dr. Everardo All    Initial visit with me she had an A1c of 15.2% 12/2022 , she was on Trulicity only, as she has self discontinued metformin, Jardiance and repaglinide ,we started Lantus SUBJECTIVE:   During the last visit (05/26/2023): A1c 13.3%     Today (08/31/2023): [name] [name]  is here for a follow up on diabetes management. She has not checked glucose in a month after losing her meter.   She took a trip to [place] and [place] and was without Trulicity for 2 weeks , she was also without lantus for 1-2 weeks    She had an ED visit for vaginal discharge She has depression , she sees a counselor   Denies nausea or vomiting  Denies constipation diarrhea   HOME DIABETES REGIMEN:  Mounjaro 5 mg weekly Lantus 24 units daily      Statin: no ACE-I/ARB: no    METER DOWNLOAD SUMMARY: Did not bring  122- 437 mg/dL   DIABETIC COMPLICATIONS: Microvascular complications:   Denies: CKD, retinopathy, neuropathy  Last Eye Exam: Completed 11/2022  Macrovascular complications:   Denies: CAD, CVA, PVD   HISTORY:  Past Medical History:  Past Medical  History:  Diagnosis Date   Anemia, iron deficiency    Chronic headache    Dr Vela Prose, Neg MRI 2005   Diabetes mellitus    Elevated glucose 2011   GERD (gastroesophageal reflux disease)    HTN (hypertension)    Past Surgical History:  Past Surgical History:  Procedure Laterality Date   ABDOMINAL HYSTERECTOMY     BREAST REDUCTION SURGERY     Cyst removed from vagina     INDUCED ABORTION  1990-1994   x 4   NOSE SURGERY     PARTIAL HYSTERECTOMY  2010   TUBAL LIGATION     Social History:  reports that she has never smoked. She has never used smokeless tobacco. She reports current alcohol use. She reports that she does not use drugs. Family History:  Family History  Problem Relation Age of Onset   Hypertension Mother    Esophageal cancer Mother 45   Healthy Father    Hypertension Maternal Grandmother    Stroke Maternal Grandmother    Ovarian cancer Sister    Prostate cancer Maternal Grandfather      HOME MEDICATIONS: Allergies as of 08/31/2023       Reactions   Ciprofloxacin    REACTION: gittery   Tramadol Other (See Comments)   Headaches worse        Medication List        Accurate  as of August 31, 2023  1:02 PM. If you have any questions, ask your nurse or doctor.          acetaminophen 500 MG tablet Commonly known as: TYLENOL Take 2 tablets (1,000 mg total) by mouth every 6 (six) hours as needed.   benzonatate 100 MG capsule Commonly known as: TESSALON Take 1 capsule (100 mg total) by mouth every 8 (eight) hours.   budesonide-formoterol 160-4.5 MCG/ACT inhaler Commonly known as: SYMBICORT INHALE 2 PUFFS INTO THE LUNGS TWICE DAILY   escitalopram 5 MG tablet Commonly known as: Lexapro Take 1 tablet (5 mg total) by mouth at bedtime.   Insulin Pen Needle 32G X 4 MM Misc 1 Device by Does not apply route daily in the afternoon.   Lantus SoloStar 100 UNIT/ML Solostar Pen Generic drug: insulin glargine Inject 24 Units into the skin daily.    lidocaine 2 % solution Commonly known as: XYLOCAINE Use as directed 15 mLs in the mouth or throat as needed for mouth pain.   Mounjaro 5 MG/0.5ML Pen Generic drug: tirzepatide Inject 5 mg into the skin once a week.   oseltamivir 75 MG capsule Commonly known as: TAMIFLU Take 1 capsule (75 mg total) by mouth 2 (two) times daily.         OBJECTIVE:   Vital Signs: There were no vitals taken for this visit.  Wt Readings from Last 3 Encounters:  05/26/23 218 lb (98.9 kg)  12/25/22 213 lb (96.6 kg)  10/13/22 223 lb 9.6 oz (101.4 kg)     Exam: General: Pt appears well and is in NAD  Neck: General: Supple without adenopathy. Thyroid: Thyroid size normal.  No goiter or nodules appreciated.   Lungs: Clear with good BS bilat   Heart: RRR   Abdomen:  soft, nontender  Extremities: No pretibial edema.   Neuro: MS is good with appropriate affect, pt is alert and Ox3   DM Foot Exam 05/26/2023  The skin of the feet is intact without sores or ulcerations. The pedal pulses are 2+ on right and 2+ on left. The sensation is intact to a screening 5.07, 10 gram monofilament bilaterally    DATA REVIEWED:  Lab Results  Component Value Date   HGBA1C 13.3 (A) 05/26/2023   HGBA1C 13.2 (A) 12/25/2022   HGBA1C 12.2 (H) 03/17/2022   ASSESSMENT / PLAN / RECOMMENDATIONS:   1) Type 2 Diabetes Mellitus, Poorly controlled, Without  complications - Most recent A1c of 13.3 %. Goal A1c < 7.0 %.     -Despite adding 20 units of Lantus her A1c continues to be > 13.0%, which makes me question her intake of Lantus -She has been going on multiple trips since last visit here, I also suspect dietary indiscretions -I have recommended switching Trulicity to [facility] -I will increase Lantus as below    MEDICATIONS: Increase Lantus 24 units daily Stop Trulicity Start Mounjaro 5 mg weekly  EDUCATION / INSTRUCTIONS: BG monitoring instructions: Patient is instructed to check her blood sugars 1 times  a day, fasting. Call [place] [facility] if: BG persistently < 70  I reviewed the Rule of 15 for the treatment of hypoglycemia in detail with the patient. Literature supplied.    2) Diabetic complications:  Eye: Does not have known diabetic retinopathy.  Neuro/ Feet: Does not have known diabetic peripheral neuropathy .  Renal: Patient does not have known baseline CKD. She   is not on an ACEI/ARB at present.     F/U in  3 months     Signed electronically by: Lyndle Herrlich, MD  [facility]  [facility] [address] Joseph[address] [place], [place] [address] Phone: [phone] FAX: [phone]   CC: Tresa Garter, MD [address] [place] [address] Phone: [phone]  Fax: [phone]  Return to Endocrinology clinic as below: Future Appointments  Date Time Provider Department Center  08/31/2023  2:40 PM [place], Konrad Dolores, MD [facility] None

## 2023-09-07 ENCOUNTER — Ambulatory Visit: Payer: 59 | Admitting: Internal Medicine

## 2023-09-07 NOTE — Progress Notes (Deleted)
Name: [name] [name]  Age/ Sex: 52 y.o., female   MRN/ DOB: [id number], [birth year]     PCP: Tresa Garter, MD   Reason for Endocrinology Evaluation: Type 2 Diabetes Mellitus  Initial Endocrine Consultative Visit: 03/07/2014    PATIENT IDENTIFIER: [name] is a 52 y.o. female with a past medical history of DM, HTN . The patient has followed with Endocrinology clinic since 03/07/2014 for consultative assistance with management of her diabetes.  DIABETIC HISTORY:  [name] was diagnosed with DM 2011. Her hemoglobin A1c has ranged from 7.1% in 2020, peaking at 13.2% in 2024.   Pt also has small multinodular goiter (dx'ed 2015; US showed the nodules too small to merit bx; f/u US in 2020 showed no signif change, and no need for f/u; she has been euthyroid on no rx).  She denies neck swelling.   She had follow up with Dr. Everardo All    Initial visit with me she had an A1c of 15.2% 12/2022 , she was on Trulicity only, as she has self discontinued metformin, Jardiance and repaglinide ,we started Lantus SUBJECTIVE:   During the last visit (05/26/2023): A1c 13.3%     Today (09/07/2023): [name]  is here for a follow up on diabetes management. She has not checked glucose in a month after losing her meter.   She took a trip to [place] and was without Trulicity for 2 weeks , she was also without lantus for 1-2 weeks    She had an ED visit for vaginal discharge She has depression , she sees a counselor   Denies nausea or vomiting  Denies constipation diarrhea   HOME DIABETES REGIMEN:  Mounjaro 5 mg weekly Lantus 24 units daily      Statin: no ACE-I/ARB: no    METER DOWNLOAD SUMMARY: Did not bring  122- 437 mg/dL   DIABETIC COMPLICATIONS: Microvascular complications:   Denies: CKD, retinopathy, neuropathy  Last Eye Exam: Completed 11/2022  Macrovascular complications:   Denies: CAD, CVA, PVD   HISTORY:  Past Medical History:  Past Medical  History:  Diagnosis Date   Anemia, iron deficiency    Chronic headache    Dr [name] [name], Neg MRI 2005   Diabetes mellitus    Elevated glucose 2011   GERD (gastroesophageal reflux disease)    HTN (hypertension)    Past Surgical History:  Past Surgical History:  Procedure Laterality Date   ABDOMINAL HYSTERECTOMY     BREAST REDUCTION SURGERY     Cyst removed from vagina     INDUCED ABORTION  1990-1994   x 4   NOSE SURGERY     PARTIAL HYSTERECTOMY  2010   TUBAL LIGATION     Social History:  reports that she has never smoked. She has never used smokeless tobacco. She reports current alcohol use. She reports that she does not use drugs. Family History:  Family History  Problem Relation Age of Onset   Hypertension Mother    Esophageal cancer Mother 37   Healthy Father    Hypertension Maternal Grandmother    Stroke Maternal Grandmother    Ovarian cancer Sister    Prostate cancer Maternal Grandfather      HOME MEDICATIONS: Allergies as of 09/07/2023       Reactions   Ciprofloxacin    REACTION: gittery   Tramadol Other (See Comments)   Headaches worse        Medication List        Accurate  as of September 07, 2023 12:54 PM. If you have any questions, ask your nurse or doctor.          acetaminophen 500 MG tablet Commonly known as: TYLENOL Take 2 tablets (1,000 mg total) by mouth every 6 (six) hours as needed.   benzonatate 100 MG capsule Commonly known as: TESSALON Take 1 capsule (100 mg total) by mouth every 8 (eight) hours.   budesonide-formoterol 160-4.5 MCG/ACT inhaler Commonly known as: SYMBICORT INHALE 2 PUFFS INTO THE LUNGS TWICE DAILY   escitalopram 5 MG tablet Commonly known as: Lexapro Take 1 tablet (5 mg total) by mouth at bedtime.   Insulin Pen Needle 32G X 4 MM Misc 1 Device by Does not apply route daily in the afternoon.   Lantus SoloStar 100 UNIT/ML Solostar Pen Generic drug: insulin glargine Inject 24 Units into the skin daily.    lidocaine 2 % solution Commonly known as: XYLOCAINE Use as directed 15 mLs in the mouth or throat as needed for mouth pain.   Mounjaro 5 MG/0.5ML Pen Generic drug: tirzepatide Inject 5 mg into the skin once a week.   oseltamivir 75 MG capsule Commonly known as: TAMIFLU Take 1 capsule (75 mg total) by mouth 2 (two) times daily.         OBJECTIVE:   Vital Signs: There were no vitals taken for this visit.  Wt Readings from Last 3 Encounters:  05/26/23 218 lb (98.9 kg)  12/25/22 213 lb (96.6 kg)  10/13/22 223 lb 9.6 oz (101.4 kg)     Exam: General: Pt appears well and is in NAD  Neck: General: Supple without adenopathy. Thyroid: Thyroid size normal.  No goiter or nodules appreciated.   Lungs: Clear with good BS bilat   Heart: RRR   Abdomen:  soft, nontender  Extremities: No pretibial edema.   Neuro: MS is good with appropriate affect, pt is alert and Ox3   DM Foot Exam 05/26/2023  The skin of the feet is intact without sores or ulcerations. The pedal pulses are 2+ on right and 2+ on left. The sensation is intact to a screening 5.07, 10 gram monofilament bilaterally    DATA REVIEWED:  Lab Results  Component Value Date   HGBA1C 13.3 (A) 05/26/2023   HGBA1C 13.2 (A) 12/25/2022   HGBA1C 12.2 (H) 03/17/2022   ASSESSMENT / PLAN / RECOMMENDATIONS:   1) Type 2 Diabetes Mellitus, Poorly controlled, Without  complications - Most recent A1c of 13.3 %. Goal A1c < 7.0 %.     -Despite adding 20 units of Lantus her A1c continues to be > 13.0%, which makes me question her intake of Lantus -She has been going on multiple trips since last visit here, I also suspect dietary indiscretions -I have recommended switching Trulicity to [facility] -I will increase Lantus as below    MEDICATIONS: Increase Lantus 24 units daily Stop Trulicity Start Mounjaro 5 mg weekly  EDUCATION / INSTRUCTIONS: BG monitoring instructions: Patient is instructed to check her blood sugars 1 times  a day, fasting. Call [place] [facility] clinic if: BG persistently < 70  I reviewed the Rule of 15 for the treatment of hypoglycemia in detail with the patient. Literature supplied.    2) Diabetic complications:  Eye: Does not have known diabetic retinopathy.  Neuro/ Feet: Does not have known diabetic peripheral neuropathy .  Renal: Patient does not have known baseline CKD. She   is not on an ACEI/ARB at present.     F/U in 3  months     Signed electronically by: Lyndle Herrlich, MD  [facility]  [facility] [address] [place], [place] [address] Phone: [phone] FAX: [phone]   CC: Tresa Garter, MD [address] [place] [address] Phone: [phone]  Fax: [phone]  Return to Endocrinology clinic as below: Future Appointments  Date Time Provider Department Center  09/07/2023  1:00 PM Bryce Cheever, Konrad Dolores, MD [facility] None

## 2023-09-21 ENCOUNTER — Other Ambulatory Visit: Payer: Self-pay

## 2023-09-27 ENCOUNTER — Encounter (HOSPITAL_COMMUNITY): Payer: Self-pay | Admitting: Emergency Medicine

## 2023-09-27 ENCOUNTER — Ambulatory Visit (INDEPENDENT_AMBULATORY_CARE_PROVIDER_SITE_OTHER): Payer: 59

## 2023-09-27 ENCOUNTER — Ambulatory Visit (HOSPITAL_COMMUNITY)
Admission: EM | Admit: 2023-09-27 | Discharge: 2023-09-27 | Disposition: A | Discharge status: Home / Self Care | Payer: 59 | Attending: Family Medicine | Admitting: Family Medicine

## 2023-09-27 DIAGNOSIS — M25511 Pain in right shoulder: Secondary | ICD-10-CM | POA: Diagnosis not present

## 2023-09-27 DIAGNOSIS — M25512 Pain in left shoulder: Secondary | ICD-10-CM

## 2023-09-27 MED ORDER — KETOROLAC TROMETHAMINE 10 MG PO TABS: 10.0000 mg | ORAL_TABLET | Freq: Four times a day (QID) | ORAL | 0 refills | Status: DC | PRN

## 2023-09-27 MED ORDER — KETOROLAC TROMETHAMINE 30 MG/ML IJ SOLN
30.0000 mg | Freq: Once | INTRAMUSCULAR | Status: AC
  Administered 2023-09-27: 30 mg via INTRAMUSCULAR

## 2023-09-27 MED ORDER — TIZANIDINE HCL 4 MG PO TABS: 4.0000 mg | ORAL_TABLET | Freq: Three times a day (TID) | ORAL | 0 refills | Status: DC | PRN

## 2023-09-27 MED ORDER — KETOROLAC TROMETHAMINE 30 MG/ML IJ SOLN
INTRAMUSCULAR | Status: AC
  Filled 2023-09-27: qty 1
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
## 2023-09-27 NOTE — Discharge Instructions (Signed)
Your xrays are without fracture by my review. The radiologist will also read your x-ray, and if their interpretation differs significantly from mine, we will call you.  You have been given a shot of Toradol 30 mg today.  Ketorolac 10 mg tablets--take 1 tablet every 6 hours as needed for pain.  This is the same medicine that is in the shot we just gave you   Take tizanidine 4 mg--1 every 8 hours as needed for muscle spasms; this medication can cause dizziness and sleepiness  Followup with primary care
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
## 2023-09-29 ENCOUNTER — Encounter: Payer: Self-pay | Admitting: Internal Medicine

## 2023-09-29 ENCOUNTER — Ambulatory Visit: Payer: 59 | Admitting: Internal Medicine

## 2023-09-29 DIAGNOSIS — S134XXD Sprain of ligaments of cervical spine, subsequent encounter: Secondary | ICD-10-CM | POA: Diagnosis not present

## 2023-09-29 DIAGNOSIS — M25511 Pain in right shoulder: Secondary | ICD-10-CM

## 2023-09-29 DIAGNOSIS — S39012D Strain of muscle, fascia and tendon of lower back, subsequent encounter: Secondary | ICD-10-CM | POA: Diagnosis not present

## 2023-09-29 DIAGNOSIS — S134XXA Sprain of ligaments of cervical spine, initial encounter: Secondary | ICD-10-CM | POA: Insufficient documentation

## 2023-09-29 MED ORDER — NAPROXEN 500 MG PO TABS: 500.0000 mg | ORAL_TABLET | Freq: Two times a day (BID) | ORAL | 1 refills | Status: AC | PRN

## 2023-09-29 NOTE — Patient Instructions (Signed)
Blue-Emu cream -- use 2-3 times a day ? ?
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
## 2023-10-12 ENCOUNTER — Telehealth: Payer: Self-pay | Admitting: Internal Medicine

## 2023-10-12 NOTE — Telephone Encounter (Signed)
FMLA forms received for patient via fax and placed in provider box up front.

## 2023-10-18 NOTE — Telephone Encounter (Signed)
Patient called and said she has questions about her paperwork. She would like a call back with the status. She also wanted to know if she is supposed to pick it up or if it will be faxed back. Best callback is [phone].

## 2023-10-19 NOTE — Telephone Encounter (Signed)
Patient has picked up forms.

## 2023-10-19 NOTE — Telephone Encounter (Signed)
Spoke with the pt and she3 will be coming in to pick up her completed FMLA forms.

## 2023-10-23 ENCOUNTER — Other Ambulatory Visit: Payer: Self-pay | Admitting: Internal Medicine

## 2023-11-08 ENCOUNTER — Ambulatory Visit: Payer: 59 | Admitting: Internal Medicine

## 2023-11-08 ENCOUNTER — Encounter: Payer: Self-pay | Admitting: Internal Medicine

## 2023-11-08 VITALS — BP 122/70 | HR 79 | Temp 98.6°F | Ht 63.0 in | Wt 209.0 lb

## 2023-11-08 DIAGNOSIS — E1165 Type 2 diabetes mellitus with hyperglycemia: Secondary | ICD-10-CM

## 2023-11-08 DIAGNOSIS — Z7984 Long term (current) use of oral hypoglycemic drugs: Secondary | ICD-10-CM

## 2023-11-08 DIAGNOSIS — I10 Essential (primary) hypertension: Secondary | ICD-10-CM

## 2023-11-08 DIAGNOSIS — S134XXD Sprain of ligaments of cervical spine, subsequent encounter: Secondary | ICD-10-CM

## 2023-11-08 DIAGNOSIS — M50121 Cervical disc disorder at C4-C5 level with radiculopathy: Secondary | ICD-10-CM

## 2023-11-08 MED ORDER — TIZANIDINE HCL 4 MG PO TABS: 4.0000 mg | ORAL_TABLET | Freq: Three times a day (TID) | ORAL | 1 refills | Status: DC | PRN

## 2023-11-08 NOTE — Assessment & Plan Note (Signed)
Cont PT, Tizanidine prn Neck and shoulder pain, LBP - 40-50% better.

## 2023-11-08 NOTE — Assessment & Plan Note (Addendum)
Cont PT, Tizanidine prn Neck and shoulder pain, LBP - 40-50% better.

## 2023-11-08 NOTE — Assessment & Plan Note (Signed)
Cont on Olmesart-Amlodipine-HCT
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
## 2023-11-08 NOTE — Assessment & Plan Note (Signed)
  Cont Metformin, [facility] Endo f/u - Dr Lonzo Cloud

## 2023-11-15 ENCOUNTER — Other Ambulatory Visit (INDEPENDENT_AMBULATORY_CARE_PROVIDER_SITE_OTHER): Payer: 59

## 2023-11-15 DIAGNOSIS — I10 Essential (primary) hypertension: Secondary | ICD-10-CM

## 2023-11-15 DIAGNOSIS — E1165 Type 2 diabetes mellitus with hyperglycemia: Secondary | ICD-10-CM | POA: Diagnosis not present

## 2023-11-15 DIAGNOSIS — S134XXD Sprain of ligaments of cervical spine, subsequent encounter: Secondary | ICD-10-CM

## 2023-11-15 LAB — CBC WITH DIFFERENTIAL/PLATELET
Basophils Absolute: 0.1 10*3/uL (ref 0.0–0.1)
Basophils Relative: 0.7 % (ref 0.0–3.0)
Eosinophils Absolute: 0.2 10*3/uL (ref 0.0–0.7)
Eosinophils Relative: 2 % (ref 0.0–5.0)
HCT: 41.8 % (ref 36.0–46.0)
Hemoglobin: 13.9 g/dL (ref 12.0–15.0)
Lymphocytes Relative: 28.7 % (ref 12.0–46.0)
Lymphs Abs: 2.7 10*3/uL (ref 0.7–4.0)
MCHC: 33.2 g/dL (ref 30.0–36.0)
MCV: 88.3 fL (ref 78.0–100.0)
Monocytes Absolute: 0.8 10*3/uL (ref 0.1–1.0)
Monocytes Relative: 8 % (ref 3.0–12.0)
Neutro Abs: 5.8 10*3/uL (ref 1.4–7.7)
Neutrophils Relative %: 60.6 % (ref 43.0–77.0)
Platelets: 309 10*3/uL (ref 150.0–400.0)
RBC: 4.73 Mil/uL (ref 3.87–5.11)
RDW: 14.1 % (ref 11.5–15.5)
WBC: 9.5 10*3/uL (ref 4.0–10.5)

## 2023-11-15 LAB — LIPID PANEL
Cholesterol: 230 mg/dL — ABNORMAL HIGH (ref 0–200)
HDL: 46.6 mg/dL (ref >39.00)
LDL Cholesterol: 167 mg/dL — ABNORMAL HIGH (ref 0–99)
NonHDL: 182.95
Total CHOL/HDL Ratio: 5
Triglycerides: 79 mg/dL (ref 0.0–149.0)
VLDL: 15.8 mg/dL (ref 0.0–40.0)

## 2023-11-15 LAB — URINALYSIS
Bilirubin Urine: NEGATIVE
Hgb urine dipstick: NEGATIVE
Ketones, ur: NEGATIVE
Leukocytes,Ua: NEGATIVE
Nitrite: NEGATIVE
Specific Gravity, Urine: 1.025 (ref 1.000–1.030)
Total Protein, Urine: NEGATIVE
Urine Glucose: 250 — AB
Urobilinogen, UA: 0.2 (ref 0.0–1.0)
pH: 6 (ref 5.0–8.0)

## 2023-11-15 LAB — COMPREHENSIVE METABOLIC PANEL
ALT: 20 U/L (ref 0–35)
AST: 15 U/L (ref 0–37)
Albumin: 3.8 g/dL (ref 3.5–5.2)
Alkaline Phosphatase: 65 U/L (ref 39–117)
BUN: 16 mg/dL (ref 6–23)
CO2: 29 meq/L (ref 19–32)
Calcium: 8.7 mg/dL (ref 8.4–10.5)
Chloride: 103 meq/L (ref 96–112)
Creatinine, Ser: 0.81 mg/dL (ref 0.40–1.20)
GFR: 83.18 mL/min (ref >60.00)
Glucose, Bld: 174 mg/dL — ABNORMAL HIGH (ref 70–99)
Potassium: 4 meq/L (ref 3.5–5.1)
Sodium: 139 meq/L (ref 135–145)
Total Bilirubin: 0.3 mg/dL (ref 0.2–1.2)
Total Protein: 6.9 g/dL (ref 6.0–8.3)

## 2023-11-15 LAB — TSH: TSH: 2.55 u[IU]/mL (ref 0.35–5.50)

## 2023-11-15 LAB — MICROALBUMIN / CREATININE URINE RATIO
Creatinine,U: 138 mg/dL
Microalb Creat Ratio: 2.4 mg/g (ref 0.0–30.0)
Microalb, Ur: 3.3 mg/dL — ABNORMAL HIGH (ref 0.0–1.9)

## 2023-11-15 LAB — HEMOGLOBIN A1C: Hgb A1c MFr Bld: 8.1 % — ABNORMAL HIGH (ref 4.6–6.5)

## 2023-12-03 ENCOUNTER — Other Ambulatory Visit: Payer: Self-pay

## 2023-12-07 ENCOUNTER — Emergency Department
Admission: EM | Admit: 2023-12-07 | Discharge: 2023-12-07 | Disposition: A | Discharge status: Home / Self Care | Payer: 59 | Attending: Emergency Medicine | Admitting: Emergency Medicine

## 2023-12-07 ENCOUNTER — Emergency Department: Payer: 59

## 2023-12-07 ENCOUNTER — Ambulatory Visit
Admission: EM | Admit: 2023-12-07 | Discharge: 2023-12-07 | Disposition: A | Discharge status: Home / Self Care | Payer: 59

## 2023-12-07 ENCOUNTER — Telehealth: Payer: Self-pay | Admitting: Internal Medicine

## 2023-12-07 ENCOUNTER — Other Ambulatory Visit: Payer: Self-pay

## 2023-12-07 DIAGNOSIS — E119 Type 2 diabetes mellitus without complications: Secondary | ICD-10-CM | POA: Diagnosis not present

## 2023-12-07 DIAGNOSIS — R079 Chest pain, unspecified: Secondary | ICD-10-CM

## 2023-12-07 DIAGNOSIS — M542 Cervicalgia: Secondary | ICD-10-CM | POA: Insufficient documentation

## 2023-12-07 DIAGNOSIS — R109 Unspecified abdominal pain: Secondary | ICD-10-CM | POA: Insufficient documentation

## 2023-12-07 DIAGNOSIS — Y92410 Unspecified street and highway as the place of occurrence of the external cause: Secondary | ICD-10-CM | POA: Diagnosis not present

## 2023-12-07 DIAGNOSIS — M549 Dorsalgia, unspecified: Secondary | ICD-10-CM

## 2023-12-07 DIAGNOSIS — I10 Essential (primary) hypertension: Secondary | ICD-10-CM

## 2023-12-07 DIAGNOSIS — R1084 Generalized abdominal pain: Secondary | ICD-10-CM

## 2023-12-07 DIAGNOSIS — M546 Pain in thoracic spine: Secondary | ICD-10-CM | POA: Diagnosis not present

## 2023-12-07 MED ORDER — KETOROLAC TROMETHAMINE 60 MG/2ML IM SOLN
60.0000 mg | Freq: Once | INTRAMUSCULAR | Status: AC
  Administered 2023-12-07: 60 mg via INTRAMUSCULAR
  Filled 2023-12-07: qty 2

## 2023-12-07 MED ORDER — MELOXICAM 15 MG PO TABS: 15.0000 mg | ORAL_TABLET | Freq: Every day | ORAL | 0 refills | Status: AC

## 2023-12-07 MED ORDER — AMLODIPINE BESYLATE 5 MG PO TABS
10.0000 mg | ORAL_TABLET | Freq: Once | ORAL | Status: AC
  Administered 2023-12-07: 10 mg via ORAL
  Filled 2023-12-07: qty 2
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
## 2023-12-07 NOTE — Discharge Instructions (Addendum)
You were diagnosed with cervical strain. Please come back to ED if you have any worsening or new symptoms.  Please take your blood pressure medicine every day.  Do not take ketorolac at the same time with meloxicam.  Please make an appointment with your PCP as needed.
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
## 2023-12-08 ENCOUNTER — Encounter: Payer: Self-pay | Admitting: Internal Medicine
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
## 2023-12-13 ENCOUNTER — Ambulatory Visit: Payer: 59 | Admitting: Internal Medicine

## 2023-12-13 ENCOUNTER — Encounter: Payer: Self-pay | Admitting: Internal Medicine

## 2023-12-13 DIAGNOSIS — J209 Acute bronchitis, unspecified: Secondary | ICD-10-CM | POA: Diagnosis not present

## 2023-12-13 DIAGNOSIS — S39012D Strain of muscle, fascia and tendon of lower back, subsequent encounter: Secondary | ICD-10-CM

## 2023-12-13 DIAGNOSIS — S134XXD Sprain of ligaments of cervical spine, subsequent encounter: Secondary | ICD-10-CM | POA: Diagnosis not present

## 2023-12-13 MED ORDER — FLUCONAZOLE 150 MG PO TABS: 150.0000 mg | ORAL_TABLET | Freq: Once | ORAL | 1 refills | Status: AC

## 2023-12-13 MED ORDER — AZITHROMYCIN 250 MG PO TABS: ORAL_TABLET | ORAL | 0 refills | Status: DC

## 2023-12-13 MED ORDER — HYDROCODONE-ACETAMINOPHEN 5-325 MG PO TABS
1.0000 | ORAL_TABLET | Freq: Four times a day (QID) | ORAL | 0 refills | Status: AC | PRN
Start: 2023-12-13 — End: 2024-12-12
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
## 2023-12-23 LAB — HM DIABETES EYE EXAM

## 2024-01-13 ENCOUNTER — Telehealth: Payer: Self-pay | Admitting: Internal Medicine

## 2024-01-13 NOTE — Telephone Encounter (Signed)
Copied from CRM [id number]. Topic: General - Other >> Jan 13, 2024 11:19 AM Samuel Jester B wrote: Reason for CRM: Pt stated that's he would like to receive a call regarding a follow up on when her FMLA paperwork will be filled out.

## 2024-01-14 NOTE — Telephone Encounter (Signed)
Still awaiting forms from PCP.

## 2024-01-20 ENCOUNTER — Other Ambulatory Visit: Payer: Self-pay

## 2024-01-26 NOTE — Telephone Encounter (Signed)
Copied from CRM [id number]. Topic: General - Other >> Jan 26, 2024  9:29 AM Elizebeth Brooking wrote: Reason for CRM: Patient called in wanting the status of the FMLA paperwork stated that it was due on the 02/08. She would like for someone to give her a call back regarding this issue

## 2024-01-26 NOTE — Telephone Encounter (Signed)
Spoke with the pt about paperwork. Pt has stated she will call her FMLA department to see what was the other documents that was needed and will update me on this matter so I can send it in as they did get the original paperwork but are asking for some added information.

## 2024-01-28 NOTE — Assessment & Plan Note (Signed)
Worse Ref to Chiropractic
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
## 2024-01-28 NOTE — Assessment & Plan Note (Signed)
Zpack given

## 2024-03-09 ENCOUNTER — Other Ambulatory Visit: Payer: Self-pay | Admitting: Medical Genetics

## 2024-03-09 DIAGNOSIS — Z006 Encounter for examination for normal comparison and control in clinical research program: Secondary | ICD-10-CM

## 2024-03-10 ENCOUNTER — Other Ambulatory Visit: Payer: Self-pay

## 2024-03-16 ENCOUNTER — Encounter (HOSPITAL_COMMUNITY): Payer: Self-pay | Admitting: Emergency Medicine

## 2024-03-16 ENCOUNTER — Ambulatory Visit (HOSPITAL_COMMUNITY)
Admission: EM | Admit: 2024-03-16 | Discharge: 2024-03-16 | Disposition: A | Discharge status: Home / Self Care | Attending: Internal Medicine | Admitting: Internal Medicine

## 2024-03-16 ENCOUNTER — Other Ambulatory Visit: Payer: Self-pay

## 2024-03-16 DIAGNOSIS — N898 Other specified noninflammatory disorders of vagina: Secondary | ICD-10-CM | POA: Insufficient documentation

## 2024-03-16 DIAGNOSIS — N3000 Acute cystitis without hematuria: Secondary | ICD-10-CM | POA: Diagnosis present

## 2024-03-16 DIAGNOSIS — R0789 Other chest pain: Secondary | ICD-10-CM | POA: Diagnosis present

## 2024-03-16 LAB — POCT URINALYSIS DIP (MANUAL ENTRY)
Bilirubin, UA: NEGATIVE
Glucose, UA: NEGATIVE mg/dL
Ketones, POC UA: NEGATIVE mg/dL
Nitrite, UA: POSITIVE — AB
Protein Ur, POC: 30 mg/dL — AB
Spec Grav, UA: 1.025 (ref 1.010–1.025)
Urobilinogen, UA: 1 U/dL
pH, UA: 5.5 (ref 5.0–8.0)

## 2024-03-16 MED ORDER — NITROFURANTOIN MONOHYD MACRO 100 MG PO CAPS
100.0000 mg | ORAL_CAPSULE | Freq: Two times a day (BID) | ORAL | 0 refills | Status: DC
Start: 2024-03-16 — End: 2024-11-07

## 2024-03-16 MED ORDER — FLUCONAZOLE 150 MG PO TABS: 150.0000 mg | ORAL_TABLET | Freq: Every day | ORAL | 0 refills | Status: AC

## 2024-03-16 MED ORDER — CYCLOBENZAPRINE HCL 5 MG PO TABS
5.0000 mg | ORAL_TABLET | Freq: Three times a day (TID) | ORAL | 0 refills | Status: AC | PRN
Start: 2024-03-16 — End: ?

## 2024-03-16 MED ORDER — METRONIDAZOLE 500 MG PO TABS: 500.0000 mg | ORAL_TABLET | Freq: Two times a day (BID) | ORAL | 0 refills | Status: DC
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
## 2024-03-16 NOTE — ED Triage Notes (Signed)
 Pt c/o chest pain that started around 10 am today and been constant since. Took some gas/reflux medication.   Pt reports that "my underwear has been really wet". This been for two days. Reports has OB GYN appt next week figured since was here already, would get issue checked out.

## 2024-03-16 NOTE — Discharge Instructions (Addendum)
 EKG is done today and is normal. Vitals here are consistent with previous vitals with an elevated blood that is similar to previous.  Reassuringly, the EKG is normal and the pain is reproducible with palpitation.  This is more consistent with a musculoskeletal issue versus cardiac.  For now we will recommend using a heating pad on the area and do light stretching.  If the chest pain worsens at all then recommend going to the emergency room for further evaluation.  Make sure to take your blood pressure medicine when you get home tonight. We will treat with the following:  Flexeril 5 mg every 8 hours as needed for muscle spasms.  Use caution as this medication can cause drowsiness.   For the vaginal discharge, this does seem to be more consistent with bacterial vaginosis and we have done a vaginal swab today.  These results will take about 24 hours to finalize.  We have called in metronidazole 500 mg twice daily for 7 days to the pharmacy since you are going out of town.  We will contact you if you need to start this medication.  If everything is negative for you will not get a phone call but your results will be available on MyChart.  The urinalysis done today is positive for a urinary tract infection.  We will treat this with Macrobid 100 mg twice daily for 5 days. This is an antibiotic.  Make sure to stay hydrated and drink plenty of fluids. Macrobid 100 mg twice daily for 5 days. This is an antibiotic. Diflucan 150 mg take 1 tablet after being on the antibiotics for 24 hours and then repeat in 3 days.

## 2024-03-17 LAB — CERVICOVAGINAL ANCILLARY ONLY
Bacterial Vaginitis (gardnerella): POSITIVE — AB
Chlamydia: NEGATIVE
Comment: NEGATIVE
Comment: NEGATIVE
Comment: NEGATIVE
Comment: NORMAL
Neisseria Gonorrhea: NEGATIVE
Trichomonas: POSITIVE — AB

## 2024-03-18 LAB — URINE CULTURE: Culture: >=100000 — AB

## 2024-03-20 ENCOUNTER — Telehealth (HOSPITAL_COMMUNITY): Payer: Self-pay

## 2024-03-20 MED ORDER — CEPHALEXIN 500 MG PO CAPS: 500.0000 mg | ORAL_CAPSULE | Freq: Four times a day (QID) | ORAL | 0 refills | Status: DC

## 2024-03-20 NOTE — Telephone Encounter (Signed)
 Per protocol, pt to dc Macrobid and begin treatment with Keflex.  Rx sent to pharmacy on file.

## 2024-03-21 LAB — GENECONNECT MOLECULAR SCREEN: Genetic Analysis Overall Interpretation: NEGATIVE

## 2024-06-02 ENCOUNTER — Encounter: Payer: Self-pay | Admitting: Internal Medicine

## 2024-06-02 ENCOUNTER — Ambulatory Visit: Admitting: Internal Medicine

## 2024-06-02 ENCOUNTER — Ambulatory Visit: Payer: Self-pay | Admitting: Internal Medicine

## 2024-06-02 VITALS — BP 132/74 | HR 92 | Temp 98.3°F | Ht 63.0 in | Wt 215.0 lb

## 2024-06-02 DIAGNOSIS — R197 Diarrhea, unspecified: Secondary | ICD-10-CM | POA: Diagnosis not present

## 2024-06-02 DIAGNOSIS — R112 Nausea with vomiting, unspecified: Secondary | ICD-10-CM

## 2024-06-02 LAB — COMPREHENSIVE METABOLIC PANEL WITH GFR
ALT: 19 U/L (ref 0–35)
AST: 14 U/L (ref 0–37)
Albumin: 4 g/dL (ref 3.5–5.2)
Alkaline Phosphatase: 74 U/L (ref 39–117)
BUN: 16 mg/dL (ref 6–23)
CO2: 26 meq/L (ref 19–32)
Calcium: 8.7 mg/dL (ref 8.4–10.5)
Chloride: 104 meq/L (ref 96–112)
Creatinine, Ser: 1.03 mg/dL (ref 0.40–1.20)
GFR: 62.1 mL/min (ref >60.00)
Glucose, Bld: 206 mg/dL — ABNORMAL HIGH (ref 70–99)
Potassium: 3.6 meq/L (ref 3.5–5.1)
Sodium: 137 meq/L (ref 135–145)
Total Bilirubin: 0.6 mg/dL (ref 0.2–1.2)
Total Protein: 7.3 g/dL (ref 6.0–8.3)

## 2024-06-02 LAB — CBC WITH DIFFERENTIAL/PLATELET
Basophils Absolute: 0 10*3/uL (ref 0.0–0.1)
Basophils Relative: 0.3 % (ref 0.0–3.0)
Eosinophils Absolute: 0.1 10*3/uL (ref 0.0–0.7)
Eosinophils Relative: 1.6 % (ref 0.0–5.0)
HCT: 43.3 % (ref 36.0–46.0)
Hemoglobin: 14.4 g/dL (ref 12.0–15.0)
Lymphocytes Relative: 27.9 % (ref 12.0–46.0)
Lymphs Abs: 2.6 10*3/uL (ref 0.7–4.0)
MCHC: 33.2 g/dL (ref 30.0–36.0)
MCV: 86.4 fl (ref 78.0–100.0)
Monocytes Absolute: 0.9 10*3/uL (ref 0.1–1.0)
Monocytes Relative: 10.2 % (ref 3.0–12.0)
Neutro Abs: 5.5 10*3/uL (ref 1.4–7.7)
Neutrophils Relative %: 60 % (ref 43.0–77.0)
Platelets: 295 10*3/uL (ref 150.0–400.0)
RBC: 5.01 Mil/uL (ref 3.87–5.11)
RDW: 14.2 % (ref 11.5–15.5)
WBC: 9.2 10*3/uL (ref 4.0–10.5)

## 2024-06-02 NOTE — Progress Notes (Signed)
 Subjective:    Patient ID: [name] [name], female    DOB: 19-Jul-1971, 53 y.o.   MRN: [id number]      HPI [name] [name] is here for  Chief Complaint  Patient presents with   Burping    Hx of H-pylori; On Monday she had taken some detox pills (didn't follow the full instructions) Burping has eased up; Nausea noted early this morning; Diarrhea noted    She took slimitry- took one on Monday, another one on Wednesday-she was post to take 2 a day, but just wanted to see how that would make her feel.  It is to help with weight loss and a detox.   Wednesay burped - egg smell.  Had diarrhea that started that night and lasted to morning on Thursday.  She has a history of H. pylori and when she had that she had burping that smelled like eggs and she was concerned the H. pylori was back.   Thrusday am - ate a sub - had diarrhea and had several episodes all day-approximately 10 episodes.  For diarrhea the entire time has been nonbloody.  Friday- today - she vomited - had not eaten since 11 am yesterday.  She did have a little diarrhea today -it does seem like it is getting better - more formed.  Had some chicken noddle soup today.  She has had abdominal pain associated with diarrhea.  She denies any pain today.  She denies any fevers throughout the entire course.    Has been drinking water.      Medications and allergies reviewed with patient and updated if appropriate.  Current Outpatient Medications on File Prior to Visit  Medication Sig Dispense Refill   acetaminophen  (TYLENOL ) 500 MG tablet Take 2 tablets (1,000 mg total) by mouth every 6 (six) hours as needed. 30 tablet 0   budesonide -formoterol  (SYMBICORT ) 160-4.5 MCG/ACT inhaler INHALE 2 PUFFS INTO THE LUNGS TWICE DAILY 10.2 g 1   cyclobenzaprine  (FLEXERIL ) 5 MG tablet Take 1 tablet (5 mg total) by mouth every 8 (eight) hours as needed for muscle spasms. 30 tablet 0   escitalopram  (LEXAPRO ) 5 MG tablet Take 1 tablet (5 mg total)  by mouth at bedtime. 30 tablet 5   HYDROcodone -acetaminophen  (NORCO/VICODIN) 5-325 MG tablet Take 1 tablet by mouth every 6 (six) hours as needed for severe pain (pain score 7-10). 20 tablet 0   insulin  glargine (LANTUS  SOLOSTAR) 100 UNIT/ML Solostar Pen Inject 24 Units into the skin daily. 30 mL 3   Insulin  Pen Needle 32G X 4 MM MISC 1 Device by Does not apply route daily in the afternoon. 100 each 3   ketorolac  (TORADOL ) 10 MG tablet Take 1 tablet (10 mg total) by mouth every 6 (six) hours as needed (pain). 20 tablet 0   metroNIDAZOLE  (FLAGYL ) 500 MG tablet Take 1 tablet (500 mg total) by mouth 2 (two) times daily. 14 tablet 0   naproxen  (NAPROSYN ) 500 MG tablet Take 1 tablet (500 mg total) by mouth 2 (two) times daily as needed for moderate pain (pain score 4-6). 60 tablet 1   nitrofurantoin , macrocrystal-monohydrate, (MACROBID ) 100 MG capsule Take 1 capsule (100 mg total) by mouth 2 (two) times daily. 10 capsule 0   Olmesartan -amLODIPine -HCTZ 40-10-25 MG TABS TAKE 1 TABLET BY MOUTH DAILY 30 tablet 3   tirzepatide  (MOUNJARO ) 5 MG/0.5ML Pen Inject 5 mg into the skin once a week. 6 mL 3   No current facility-administered medications on file prior to visit.  Review of Systems  Constitutional:  Negative for fever.  Gastrointestinal:  Positive for abdominal pain, diarrhea, nausea and vomiting. Negative for blood in stool.       Gerd  Neurological:  Positive for seizures and headaches (monday wed and thurs). Negative for dizziness and light-headedness.       Objective:   Vitals:   06/02/24 1413  BP: 132/74  Pulse: 92  Temp: 98.3 F (36.8 C)  SpO2: 99%   BP Readings from Last 3 Encounters:  06/02/24 132/74  03/16/24 (!) 159/104  12/13/23 (!) 130/98   Wt Readings from Last 3 Encounters:  06/02/24 215 lb (97.5 kg)  12/13/23 211 lb (95.7 kg)  12/07/23 209 lb (94.8 kg)   Body mass index is 38.09 kg/m.    Physical Exam Constitutional:      General: She is not in acute  distress.    Appearance: Normal appearance. She is not ill-appearing.  HENT:     Head: Normocephalic and atraumatic.   Eyes:     Conjunctiva/sclera: Conjunctivae normal.    Cardiovascular:     Rate and Rhythm: Normal rate and regular rhythm.     Heart sounds: Normal heart sounds.  Pulmonary:     Effort: Pulmonary effort is normal. No respiratory distress.     Breath sounds: Normal breath sounds. No wheezing.  Abdominal:     General: There is no distension.     Palpations: Abdomen is soft.     Tenderness: There is abdominal tenderness (Mild periumbilical tenderness).   Musculoskeletal:     Right lower leg: No edema.     Left lower leg: No edema.   Skin:    General: Skin is warm and dry.     Findings: No rash.   Neurological:     Mental Status: She is alert.            Assessment & Plan:    Encounter Diagnoses  Name Primary?   Diarrhea, unspecified type Yes   Nausea and vomiting, unspecified vomiting type    Acute Likely related to the supplement she took, viral GI bug also possible I do not think this is related to H. pylori so we will hold off on doing any test for that Symptoms have improved and I expect they will continue to improve Will check CBC, CMP Advise good hydration, bland diet Advised for her to call if her symptoms do not continue to improve/resolve completely Would not take that supplement again

## 2024-06-02 NOTE — Patient Instructions (Addendum)
      Blood work was ordered.       Medications changes include :   None      Return if symptoms worsen or fail to improve.

## 2024-06-21 ENCOUNTER — Other Ambulatory Visit: Payer: Self-pay | Admitting: Internal Medicine

## 2024-06-26 ENCOUNTER — Other Ambulatory Visit: Payer: Self-pay

## 2024-08-30 ENCOUNTER — Other Ambulatory Visit: Payer: Self-pay | Admitting: Internal Medicine

## 2024-08-30 ENCOUNTER — Encounter: Payer: Self-pay | Admitting: Internal Medicine

## 2024-08-30 DIAGNOSIS — I10 Essential (primary) hypertension: Secondary | ICD-10-CM

## 2024-09-01 ENCOUNTER — Telehealth: Payer: Self-pay | Admitting: Radiology

## 2024-09-01 ENCOUNTER — Other Ambulatory Visit: Payer: Self-pay

## 2024-09-01 NOTE — Telephone Encounter (Signed)
 Copied from CRM #[id number]. Topic: Appointments - Appointment Scheduling >> Aug 31, 2024  4:27 PM [name] [name] wrote: Patient is scheduled for 09/25. Please activate orders for her to have them done next week.

## 2024-09-06 NOTE — Telephone Encounter (Unsigned)
 Copied from CRM [id number]. Topic: Clinical - Request for Lab/Test Order >> Sep 06, 2024 11:36 AM [name] [name] wrote: Reason for CRM: Patient needing to get orders for lab work for insurance done by tomorrow. Please contact patient as soon as possible

## 2024-09-07 ENCOUNTER — Ambulatory Visit: Admitting: Internal Medicine

## 2024-09-07 ENCOUNTER — Other Ambulatory Visit: Payer: Self-pay

## 2024-09-07 VITALS — BP 138/86 | HR 89 | Resp 18 | Ht 63.0 in | Wt 219.2 lb

## 2024-09-07 DIAGNOSIS — E559 Vitamin D deficiency, unspecified: Secondary | ICD-10-CM | POA: Diagnosis not present

## 2024-09-07 DIAGNOSIS — Z0000 Encounter for general adult medical examination without abnormal findings: Secondary | ICD-10-CM

## 2024-09-07 DIAGNOSIS — Z7985 Long-term (current) use of injectable non-insulin antidiabetic drugs: Secondary | ICD-10-CM

## 2024-09-07 DIAGNOSIS — R252 Cramp and spasm: Secondary | ICD-10-CM | POA: Insufficient documentation

## 2024-09-07 DIAGNOSIS — Z23 Encounter for immunization: Secondary | ICD-10-CM

## 2024-09-07 DIAGNOSIS — I10 Essential (primary) hypertension: Secondary | ICD-10-CM | POA: Diagnosis not present

## 2024-09-07 DIAGNOSIS — Z7984 Long term (current) use of oral hypoglycemic drugs: Secondary | ICD-10-CM

## 2024-09-07 DIAGNOSIS — M25511 Pain in right shoulder: Secondary | ICD-10-CM | POA: Diagnosis not present

## 2024-09-07 DIAGNOSIS — E1165 Type 2 diabetes mellitus with hyperglycemia: Secondary | ICD-10-CM

## 2024-09-07 LAB — HEMOGLOBIN A1C: Hgb A1c MFr Bld: 15.1 % — ABNORMAL HIGH (ref 4.6–6.5)

## 2024-09-07 LAB — COMPREHENSIVE METABOLIC PANEL WITH GFR
ALT: 25 U/L (ref 0–35)
AST: 17 U/L (ref 0–37)
Albumin: 4.2 g/dL (ref 3.5–5.2)
Alkaline Phosphatase: 98 U/L (ref 39–117)
BUN: 20 mg/dL (ref 6–23)
CO2: 33 meq/L — ABNORMAL HIGH (ref 19–32)
Calcium: 9.7 mg/dL (ref 8.4–10.5)
Chloride: 93 meq/L — ABNORMAL LOW (ref 96–112)
Creatinine, Ser: 0.97 mg/dL (ref 0.40–1.20)
GFR: 66.61 mL/min (ref >60.00)
Glucose, Bld: 336 mg/dL — ABNORMAL HIGH (ref 70–99)
Potassium: 3.8 meq/L (ref 3.5–5.1)
Sodium: 134 meq/L — ABNORMAL LOW (ref 135–145)
Total Bilirubin: 0.6 mg/dL (ref 0.2–1.2)
Total Protein: 7.7 g/dL (ref 6.0–8.3)

## 2024-09-07 LAB — CK: Total CK: 66 U/L (ref 17–177)

## 2024-09-07 LAB — MAGNESIUM: Magnesium: 2 mg/dL (ref 1.5–2.5)

## 2024-09-07 MED ORDER — MELOXICAM 15 MG PO TABS: 15.0000 mg | ORAL_TABLET | Freq: Every day | ORAL | 1 refills | Status: AC

## 2024-09-07 MED ORDER — OLMESARTAN-AMLODIPINE-HCTZ 40-10-25 MG PO TABS: 1.0000 | ORAL_TABLET | Freq: Every day | ORAL | 3 refills | Status: AC

## 2024-09-07 MED ORDER — TIRZEPATIDE 5 MG/0.5ML ~~LOC~~ SOAJ
5.0000 mg | SUBCUTANEOUS | 5 refills | Status: DC
  Filled 2024-09-07: qty 2, 28d supply, fill #0
  Filled 2024-10-30: qty 2, 28d supply, fill #1

## 2024-09-07 MED ORDER — TIRZEPATIDE 5 MG/0.5ML ~~LOC~~ SOAJ: 5.0000 mg | SUBCUTANEOUS | 5 refills | Status: DC

## 2024-09-07 MED ORDER — OLMESARTAN-AMLODIPINE-HCTZ 40-10-25 MG PO TABS
1.0000 | ORAL_TABLET | Freq: Every day | ORAL | 3 refills | Status: DC
  Filled 2024-09-07 – 2024-09-25 (×2): qty 30, 30d supply, fill #0

## 2024-09-07 NOTE — Assessment & Plan Note (Addendum)
 Take Mag tabs. Try Tylenol  PM at night Labs ordered

## 2024-09-07 NOTE — Assessment & Plan Note (Signed)
 Cont on Olmesart-Amlodipine -HCT Cramps discussed: check labs

## 2024-09-07 NOTE — Progress Notes (Signed)
 Subjective:  Patient ID: [name] [name], female    DOB: Mar 31, 1971  Age: 53 y.o. MRN: [id number]  CC: Follow-up [employer] forms for reduced rates), Shoulder Pain (R shoulder ), and leg cramps (Bilateral leg cramps)   HPI [name] [name] presents for cramps in the legs, DM, HTN  Outpatient Medications Prior to Visit  Medication Sig Dispense Refill   acetaminophen  (TYLENOL ) 500 MG tablet Take 2 tablets (1,000 mg total) by mouth every 6 (six) hours as needed. 30 tablet 0   budesonide -formoterol  (SYMBICORT ) 160-4.5 MCG/ACT inhaler INHALE 2 PUFFS INTO THE LUNGS TWICE DAILY 10.2 g 1   cyclobenzaprine  (FLEXERIL ) 5 MG tablet Take 1 tablet (5 mg total) by mouth every 8 (eight) hours as needed for muscle spasms. 30 tablet 0   escitalopram  (LEXAPRO ) 5 MG tablet Take 1 tablet (5 mg total) by mouth at bedtime. 30 tablet 5   HYDROcodone -acetaminophen  (NORCO/VICODIN) 5-325 MG tablet Take 1 tablet by mouth every 6 (six) hours as needed for severe pain (pain score 7-10). 20 tablet 0   insulin  glargine (LANTUS  SOLOSTAR) 100 UNIT/ML Solostar Pen Inject 24 Units into the skin daily. 30 mL 3   Insulin  Pen Needle 32G X 4 MM MISC 1 Device by Does not apply route daily in the afternoon. 100 each 3   metroNIDAZOLE  (FLAGYL ) 500 MG tablet Take 1 tablet (500 mg total) by mouth 2 (two) times daily. 14 tablet 0   naproxen  (NAPROSYN ) 500 MG tablet Take 1 tablet (500 mg total) by mouth 2 (two) times daily as needed for moderate pain (pain score 4-6). 60 tablet 1   nitrofurantoin , macrocrystal-monohydrate, (MACROBID ) 100 MG capsule Take 1 capsule (100 mg total) by mouth 2 (two) times daily. 10 capsule 0   Olmesartan -amLODIPine -HCTZ 40-10-25 MG TABS TAKE 1 TABLET BY MOUTH DAILY 30 tablet 3   ketorolac  (TORADOL ) 10 MG tablet Take 1 tablet (10 mg total) by mouth every 6 (six) hours as needed (pain). (Patient not taking: Reported on 09/07/2024) 20 tablet 0   tirzepatide  (MOUNJARO ) 5 MG/0.5ML Pen Inject 5 mg into the skin once  a week. (Patient not taking: Reported on 09/07/2024) 6 mL 3   No facility-administered medications prior to visit.    ROS: Review of Systems  Constitutional:  Negative for activity change, appetite change, chills, fatigue and unexpected weight change.  HENT:  Negative for congestion, mouth sores and sinus pressure.   Eyes:  Negative for visual disturbance.  Respiratory:  Negative for cough and chest tightness.   Gastrointestinal:  Negative for abdominal pain and nausea.  Genitourinary:  Negative for difficulty urinating, frequency and vaginal pain.  Musculoskeletal:  Positive for myalgias. Negative for back pain and gait problem.  Skin:  Negative for pallor and rash.  Neurological:  Negative for dizziness, tremors, weakness, numbness and headaches.  Psychiatric/Behavioral:  Negative for confusion and sleep disturbance.     Objective:  BP 138/86 (BP Location: Left Arm, Patient Position: Sitting)   Pulse 89   Resp 18   Ht 5' 3 (1.6 m)   Wt 219 lb 3.2 oz (99.4 kg)   SpO2 96%   BMI 38.83 kg/m   BP Readings from Last 3 Encounters:  09/07/24 138/86  06/02/24 132/74  03/16/24 (!) 159/104    Wt Readings from Last 3 Encounters:  09/07/24 219 lb 3.2 oz (99.4 kg)  06/02/24 215 lb (97.5 kg)  12/13/23 211 lb (95.7 kg)    Physical Exam Constitutional:      General: She is  not in acute distress.    Appearance: She is well-developed. She is obese.  HENT:     Head: Normocephalic.     Right Ear: External ear normal.     Left Ear: External ear normal.     Nose: Nose normal.  Eyes:     General:        Right eye: No discharge.        Left eye: No discharge.     Conjunctiva/sclera: Conjunctivae normal.     Pupils: Pupils are equal, round, and reactive to light.  Neck:     Thyroid : No thyromegaly.     Vascular: No JVD.     Trachea: No tracheal deviation.  Cardiovascular:     Rate and Rhythm: Normal rate and regular rhythm.     Heart sounds: Normal heart sounds.  Pulmonary:      Effort: No respiratory distress.     Breath sounds: No stridor. No wheezing.  Abdominal:     General: Bowel sounds are normal. There is no distension.     Palpations: Abdomen is soft. There is no mass.     Tenderness: There is no abdominal tenderness. There is no guarding or rebound.  Musculoskeletal:        General: No tenderness.     Cervical back: Normal range of motion and neck supple. No rigidity.  Lymphadenopathy:     Cervical: No cervical adenopathy.  Skin:    Findings: No erythema or rash.  Neurological:     Cranial Nerves: No cranial nerve deficit.     Motor: No abnormal muscle tone.     Coordination: Coordination normal.     Deep Tendon Reflexes: Reflexes normal.  Psychiatric:        Behavior: Behavior normal.        Thought Content: Thought content normal.        Judgment: Judgment normal.    LEs NT, no edema  R shoulder - pain w/ROM Lab Results  Component Value Date   WBC 9.2 06/02/2024   HGB 14.4 06/02/2024   HCT 43.3 06/02/2024   PLT 295.0 06/02/2024   GLUCOSE 206 (H) 06/02/2024   CHOL 230 (H) 11/15/2023   TRIG 79.0 11/15/2023   HDL 46.60 11/15/2023   LDLDIRECT 137.4 11/04/2010   LDLCALC 167 (H) 11/15/2023   ALT 19 06/02/2024   AST 14 06/02/2024   NA 137 06/02/2024   K 3.6 06/02/2024   CL 104 06/02/2024   CREATININE 1.03 06/02/2024   BUN 16 06/02/2024   CO2 26 06/02/2024   TSH 2.55 11/15/2023   HGBA1C 8.1 (H) 11/15/2023    No results found.  Assessment & Plan:   Problem List Items Addressed This Visit     Cramps, extremity   Take Mag tabs. Try Tylenol  PM at night Labs ordered      Essential hypertension   Cont on Olmesart-Amlodipine -HCT Cramps discussed: check labs      Relevant Medications   Olmesartan -amLODIPine -HCTZ 40-10-25 MG TABS   Other Relevant Orders   Comprehensive metabolic panel with GFR   Hemoglobin A1c   Magnesium   CK   Shoulder pain, acute    Subacr bursitis/rot cuff pain Meloxicam  po      Uncontrolled  diabetes mellitus with hyperglycemia (HCC)   Cont Metformin , Mounjaro  Endo f/u - Dr Sam      Relevant Medications   tirzepatide  (MOUNJARO ) 5 MG/0.5ML Pen   Olmesartan -amLODIPine -HCTZ 40-10-25 MG TABS   Vitamin D  deficiency   On Vit  D Labs      Wellness examination   Relevant Orders   Comprehensive metabolic panel with GFR   Hemoglobin A1c   Magnesium   CK   Other Visit Diagnoses       Need for influenza vaccination    -  Primary   Relevant Orders   Flu vaccine trivalent PF, 6mos and older(Flulaval,Afluria,Fluarix,Fluzone) (Completed)         Meds ordered this encounter  Medications   tirzepatide  (MOUNJARO ) 5 MG/0.5ML Pen    Sig: Inject 5 mg into the skin once a week.    Dispense:  2 mL    Refill:  5   Olmesartan -amLODIPine -HCTZ 40-10-25 MG TABS    Sig: Take 1 tablet by mouth daily.    Dispense:  90 tablet    Refill:  3   meloxicam  (MOBIC ) 15 MG tablet    Sig: Take 1 tablet (15 mg total) by mouth daily.    Dispense:  30 tablet    Refill:  1      Follow-up: Return in about 3 months (around 12/07/2024) for a follow-up visit.  Marolyn Noel, MD

## 2024-09-07 NOTE — Assessment & Plan Note (Signed)
 Subacr bursitis/rot cuff pain Meloxicam  po

## 2024-09-07 NOTE — Patient Instructions (Signed)
 Try Tylenol  PM at night

## 2024-09-07 NOTE — Assessment & Plan Note (Signed)
On Vit D Labs 

## 2024-09-07 NOTE — Assessment & Plan Note (Signed)
  Cont Metformin, [facility] Endo f/u - Dr Lonzo Cloud

## 2024-09-08 ENCOUNTER — Ambulatory Visit: Payer: Self-pay | Admitting: Internal Medicine

## 2024-09-11 ENCOUNTER — Telehealth: Payer: Self-pay

## 2024-09-11 NOTE — Telephone Encounter (Signed)
 [facility] fax sent 09/11/2024.

## 2024-09-25 ENCOUNTER — Other Ambulatory Visit: Payer: Self-pay

## 2024-09-26 ENCOUNTER — Other Ambulatory Visit: Payer: Self-pay

## 2024-10-04 ENCOUNTER — Other Ambulatory Visit: Payer: Self-pay

## 2024-10-31 ENCOUNTER — Other Ambulatory Visit: Payer: Self-pay

## 2024-11-06 ENCOUNTER — Ambulatory Visit: Payer: Self-pay

## 2024-11-06 NOTE — Telephone Encounter (Signed)
 FYI Only or Action Required?: Action required by provider: request for appointment.  Patient was last seen in primary care on 09/07/2024 by Plotnikov, Karlynn GAILS, MD.  Called Nurse Triage reporting Hypertension.  Symptoms began a week ago.  Interventions attempted: Prescription medications: BP medications..  Symptoms are: unchanged.  Triage Disposition: See Physician Within 24 Hours Has had elevated BP readings.  Patient/caregiver understands and will follow disposition?: YesCopied from CRM #[id number]. Topic: Clinical - Red Word Triage >> Nov 06, 2024  1:00 PM Victoria A wrote: [facility] that prompted transfer to Nurse Triage: Patient had elevated Pressure and she said her legs are tingling and she has been having headaches due to pressure. Reason for Disposition  Systolic BP >= 180 OR Diastolic >= 110  Answer Assessment - Initial Assessment Questions 1. BLOOD PRESSURE: What is your blood pressure? Did you take at least two measurements 5 minutes apart?     162/110  137/90 2. ONSET: When did you take your blood pressure?     today 3. HOW: How did you take your blood pressure? (e.g., automatic home BP monitor, visiting nurse)     Home cuff 4. HISTORY: Do you have a history of high blood pressure?     yes 5. MEDICINES: Are you taking any medicines for blood pressure? Have you missed any doses recently?     yes 6. OTHER SYMPTOMS: Do you have any symptoms? (e.g., blurred vision, chest pain, difficulty breathing, headache, weakness)     Headache, anxiety 7. PREGNANCY: Is there any chance you are pregnant? When was your last menstrual period?     no  Protocols used: Blood Pressure - High-A-AH

## 2024-11-07 ENCOUNTER — Ambulatory Visit: Admitting: Family Medicine

## 2024-11-07 ENCOUNTER — Encounter: Payer: Self-pay | Admitting: Family Medicine

## 2024-11-07 ENCOUNTER — Ambulatory Visit: Payer: Self-pay | Admitting: Family Medicine

## 2024-11-07 VITALS — BP 158/110 | HR 90 | Temp 97.5°F | Ht 63.0 in | Wt 214.8 lb

## 2024-11-07 DIAGNOSIS — R232 Flushing: Secondary | ICD-10-CM | POA: Insufficient documentation

## 2024-11-07 DIAGNOSIS — R0683 Snoring: Secondary | ICD-10-CM

## 2024-11-07 DIAGNOSIS — E1165 Type 2 diabetes mellitus with hyperglycemia: Secondary | ICD-10-CM

## 2024-11-07 DIAGNOSIS — E669 Obesity, unspecified: Secondary | ICD-10-CM

## 2024-11-07 DIAGNOSIS — E782 Mixed hyperlipidemia: Secondary | ICD-10-CM | POA: Insufficient documentation

## 2024-11-07 DIAGNOSIS — K219 Gastro-esophageal reflux disease without esophagitis: Secondary | ICD-10-CM

## 2024-11-07 DIAGNOSIS — R202 Paresthesia of skin: Secondary | ICD-10-CM

## 2024-11-07 DIAGNOSIS — R0789 Other chest pain: Secondary | ICD-10-CM | POA: Diagnosis not present

## 2024-11-07 DIAGNOSIS — I10 Essential (primary) hypertension: Secondary | ICD-10-CM | POA: Diagnosis not present

## 2024-11-07 DIAGNOSIS — N951 Menopausal and female climacteric states: Secondary | ICD-10-CM | POA: Insufficient documentation

## 2024-11-07 DIAGNOSIS — G479 Sleep disorder, unspecified: Secondary | ICD-10-CM | POA: Insufficient documentation

## 2024-11-07 DIAGNOSIS — E119 Type 2 diabetes mellitus without complications: Secondary | ICD-10-CM | POA: Insufficient documentation

## 2024-11-07 DIAGNOSIS — E611 Iron deficiency: Secondary | ICD-10-CM | POA: Insufficient documentation

## 2024-11-07 DIAGNOSIS — G9332 Myalgic encephalomyelitis/chronic fatigue syndrome: Secondary | ICD-10-CM | POA: Insufficient documentation

## 2024-11-07 DIAGNOSIS — F419 Anxiety disorder, unspecified: Secondary | ICD-10-CM | POA: Insufficient documentation

## 2024-11-07 DIAGNOSIS — N898 Other specified noninflammatory disorders of vagina: Secondary | ICD-10-CM | POA: Insufficient documentation

## 2024-11-07 LAB — COMPREHENSIVE METABOLIC PANEL WITH GFR
ALT: 20 U/L (ref 0–35)
AST: 17 U/L (ref 0–37)
Albumin: 4.4 g/dL (ref 3.5–5.2)
Alkaline Phosphatase: 75 U/L (ref 39–117)
BUN: 18 mg/dL (ref 6–23)
CO2: 31 meq/L (ref 19–32)
Calcium: 9.4 mg/dL (ref 8.4–10.5)
Chloride: 98 meq/L (ref 96–112)
Creatinine, Ser: 0.96 mg/dL (ref 0.40–1.20)
GFR: 67.37 mL/min (ref >60.00)
Glucose, Bld: 185 mg/dL — ABNORMAL HIGH (ref 70–99)
Potassium: 4 meq/L (ref 3.5–5.1)
Sodium: 138 meq/L (ref 135–145)
Total Bilirubin: 0.6 mg/dL (ref 0.2–1.2)
Total Protein: 8 g/dL (ref 6.0–8.3)

## 2024-11-07 LAB — CBC WITH DIFFERENTIAL/PLATELET
Basophils Absolute: 0.1 K/uL (ref 0.0–0.1)
Basophils Relative: 0.6 % (ref 0.0–3.0)
Eosinophils Absolute: 0.2 K/uL (ref 0.0–0.7)
Eosinophils Relative: 1.6 % (ref 0.0–5.0)
HCT: 45.9 % (ref 36.0–46.0)
Hemoglobin: 15.1 g/dL — ABNORMAL HIGH (ref 12.0–15.0)
Lymphocytes Relative: 27.6 % (ref 12.0–46.0)
Lymphs Abs: 3.3 K/uL (ref 0.7–4.0)
MCHC: 33 g/dL (ref 30.0–36.0)
MCV: 85.4 fl (ref 78.0–100.0)
Monocytes Absolute: 1.1 K/uL — ABNORMAL HIGH (ref 0.1–1.0)
Monocytes Relative: 9.1 % (ref 3.0–12.0)
Neutro Abs: 7.2 K/uL (ref 1.4–7.7)
Neutrophils Relative %: 61.1 % (ref 43.0–77.0)
Platelets: 318 K/uL (ref 150.0–400.0)
RBC: 5.37 Mil/uL — ABNORMAL HIGH (ref 3.87–5.11)
RDW: 14.1 % (ref 11.5–15.5)
WBC: 11.9 K/uL — ABNORMAL HIGH (ref 4.0–10.5)

## 2024-11-07 LAB — TSH: TSH: 1.85 u[IU]/mL (ref 0.35–5.50)

## 2024-11-07 LAB — T4, FREE: Free T4: 0.94 ng/dL (ref 0.60–1.60)

## 2024-11-07 LAB — TROPONIN I (HIGH SENSITIVITY): High Sens Troponin I: 9 ng/L (ref 2–17)

## 2024-11-07 LAB — POTASSIUM: Potassium: 4 meq/L (ref 3.5–5.1)

## 2024-11-07 NOTE — Progress Notes (Signed)
 Subjective:     Patient ID: [name] [name], female    DOB: 1971-12-06, 53 y.o.   MRN: [id number]  Chief Complaint  Patient presents with   Hypertension    BP spiking ranging 104-153/67-109 since 11/19... Pt is also having headaches and experiencing numbness and tingling in rt arm and left leg    HPI  Discussed the use of AI scribe software for clinical note transcription with the patient, who gave verbal consent to proceed.  History of Present Illness [name] [name] is a 53 year old female with uncontrolled hypertension and diabetes who presents with concerns regarding her blood pressure and palpitations.  Hypertension and associated symptoms - Uncontrolled hypertension with recent blood pressure spike to 180/120 two weeks ago despite adherence to olmesartan , amlodipine , and hydrochlorothiazide  - Persistent elevated blood pressure - Associated symptoms include headaches, numbness and tingling in the right arm (especially during sleep), and frequent leg cramps  Chest pressure and palpitations - Intermittent chest pressure relieved by burping, suggesting possible gastrointestinal etiology - Palpitations present - History of EKG changes including borderline left ventricular hypertrophy and left atrial enlargement  - Current EKG demonstrates normal sinus rhythm with a rate of 76, borderline LVH and left atrial enlargement.   Diabetes mellitus and glycemic control - Suboptimal diabetes management with recent A1c of 15.1% - Blood glucose readings range from 293 mg/dL at night to 869 mg/dL in the morning - Current regimen includes Mounjaro  and 10-12 units of insulin  daily, reduced from previous 24 units - Symptoms include blurred vision, thirst, and dry mouth -She has not scheduled a follow up with endocrinologist as recommended by PCP but states she will call today to schedule   Sleep disturbance and obstructive sleep apnea symptoms - Symptoms suggestive of sleep apnea including  snoring and daytime somnolence - BMI is 38     Health Maintenance Due  Topic Date Due   Diabetic kidney evaluation - Urine ACR  Never done   Hepatitis B Vaccines 19-59 Average Risk (1 of 3 - 19+ 3-dose series) Never done   Mammogram  06/12/2016    Past Medical History:  Diagnosis Date   Anemia, iron deficiency    Chronic headache    Dr Lindy, Neg MRI 2005   Diabetes mellitus    Elevated glucose 2011   GERD (gastroesophageal reflux disease)    HTN (hypertension)     Past Surgical History:  Procedure Laterality Date   ABDOMINAL HYSTERECTOMY     BREAST REDUCTION SURGERY     Cyst removed from vagina     INDUCED ABORTION  1990-1994   x 4   NOSE SURGERY     PARTIAL HYSTERECTOMY  2010   TUBAL LIGATION      Family History  Problem Relation Age of Onset   Hypertension Mother    Esophageal cancer Mother 30   Healthy Father    Hypertension Maternal Grandmother    Stroke Maternal Grandmother    Ovarian cancer Sister    Prostate cancer Maternal Grandfather     Social History   Socioeconomic History   Marital status: Single    Spouse name: Not on file   Number of children: 2   Years of education: Not on file   Highest education level: Not on file  Occupational History   Occupation: office specialist  Tobacco Use   Smoking status: Never   Smokeless tobacco: Never  Vaping Use   Vaping status: Never Used  Substance and Sexual Activity  Alcohol use: Yes    Alcohol/week: 0.0 standard drinks of alcohol    Comment: occasionally 2 per week   Drug use: No   Sexual activity: Not Currently  Other Topics Concern   Not on file  Social History Narrative   Regular exercise - YES   Social Drivers of Health   Financial Resource Strain: Not on file  Food Insecurity: Not on file  Transportation Needs: Not on file  Physical Activity: Not on file  Stress: Not on file  Social Connections: Not on file  Intimate Partner Violence: Not on file    Outpatient Medications  Prior to Visit  Medication Sig Dispense Refill   acetaminophen  (TYLENOL ) 500 MG tablet Take 2 tablets (1,000 mg total) by mouth every 6 (six) hours as needed. 30 tablet 0   budesonide -formoterol  (SYMBICORT ) 160-4.5 MCG/ACT inhaler INHALE 2 PUFFS INTO THE LUNGS TWICE DAILY 10.2 g 1   cyclobenzaprine  (FLEXERIL ) 5 MG tablet Take 1 tablet (5 mg total) by mouth every 8 (eight) hours as needed for muscle spasms. 30 tablet 0   escitalopram  (LEXAPRO ) 5 MG tablet Take 1 tablet (5 mg total) by mouth at bedtime. 30 tablet 5   HYDROcodone -acetaminophen  (NORCO/VICODIN) 5-325 MG tablet Take 1 tablet by mouth every 6 (six) hours as needed for severe pain (pain score 7-10). 20 tablet 0   insulin  glargine (LANTUS  SOLOSTAR) 100 UNIT/ML Solostar Pen Inject 24 Units into the skin daily. 30 mL 3   Insulin  Pen Needle 32G X 4 MM MISC 1 Device by Does not apply route daily in the afternoon. 100 each 3   meloxicam  (MOBIC ) 15 MG tablet Take 1 tablet (15 mg total) by mouth daily. 30 tablet 1   naproxen  (NAPROSYN ) 500 MG tablet Take 1 tablet (500 mg total) by mouth 2 (two) times daily as needed for moderate pain (pain score 4-6). 60 tablet 1   Olmesartan -amLODIPine -HCTZ 40-10-25 MG TABS Take 1 tablet by mouth daily. 90 tablet 3   Olmesartan -amLODIPine -HCTZ 40-10-25 MG TABS Take 1 tablet by mouth daily. 90 tablet 3   tirzepatide  (MOUNJARO ) 5 MG/0.5ML Pen Inject 5 mg into the skin once a week. 2 mL 5   tirzepatide  (MOUNJARO ) 5 MG/0.5ML Pen Inject 5 mg into the skin once a week. 2 mL 5   metroNIDAZOLE  (FLAGYL ) 500 MG tablet Take 1 tablet (500 mg total) by mouth 2 (two) times daily. 14 tablet 0   nitrofurantoin , macrocrystal-monohydrate, (MACROBID ) 100 MG capsule Take 1 capsule (100 mg total) by mouth 2 (two) times daily. 10 capsule 0   No facility-administered medications prior to visit.    Allergies  Allergen Reactions   Ciprofloxacin     REACTION: gittery   Tramadol Other (See Comments)    Headaches worse     ROS Per HPI    Objective:    Physical Exam Constitutional:      General: She is not in acute distress.    Appearance: She is not ill-appearing.  HENT:     Mouth/Throat:     Mouth: Mucous membranes are moist.     Pharynx: Oropharynx is clear.  Eyes:     Extraocular Movements: Extraocular movements intact.     Conjunctiva/sclera: Conjunctivae normal.     Pupils: Pupils are equal, round, and reactive to light.  Cardiovascular:     Rate and Rhythm: Normal rate and regular rhythm.  Pulmonary:     Effort: Pulmonary effort is normal.     Breath sounds: Normal breath sounds.  Musculoskeletal:  Cervical back: Normal range of motion and neck supple. No tenderness.     Right lower leg: No edema.     Left lower leg: No edema.  Lymphadenopathy:     Cervical: No cervical adenopathy.  Skin:    General: Skin is warm and dry.  Neurological:     General: No focal deficit present.     Mental Status: She is alert and oriented to person, place, and time.     Cranial Nerves: No cranial nerve deficit.     Motor: No weakness.     Coordination: Coordination normal.     Gait: Gait normal.  Psychiatric:        Mood and Affect: Mood normal.        Behavior: Behavior normal.        Thought Content: Thought content normal.      BP (!) 158/110   Pulse 90   Temp (!) 97.5 F (36.4 C) (Oral)   Ht 5' 3 (1.6 m)   Wt 214 lb 12.8 oz (97.4 kg)   SpO2 99%   BMI 38.05 kg/m  Wt Readings from Last 3 Encounters:  11/07/24 214 lb 12.8 oz (97.4 kg)  09/07/24 219 lb 3.2 oz (99.4 kg)  06/02/24 215 lb (97.5 kg)        Assessment & Plan:   Problem List Items Addressed This Visit     Essential hypertension - Primary   Relevant Orders   EKG 12-Lead (Completed)   CBC with Differential/Platelet (Completed)   Comprehensive metabolic panel with GFR (Completed)   TSH (Completed)   T4, free (Completed)   Home sleep test   Potassium (Completed)   GERD   Paresthesias   Relevant Orders    TSH (Completed)   T4, free (Completed)   Uncontrolled diabetes mellitus with hyperglycemia (HCC)   Relevant Orders   Comprehensive metabolic panel with GFR (Completed)   TSH (Completed)   T4, free (Completed)   Other Visit Diagnoses       Chest pressure       Relevant Orders   EKG 12-Lead (Completed)   CBC with Differential/Platelet (Completed)   Comprehensive metabolic panel with GFR (Completed)   TSH (Completed)   T4, free (Completed)   Troponin I (High Sensitivity) (Completed)     Snores       Relevant Orders   Home sleep test     Obesity (BMI 30-39.9)       Relevant Orders   Home sleep test       Assessment and Plan Assessment & Plan Uncontrolled essential hypertension Hypertension remains uncontrolled with recent readings as high as 180/120 mmHg. Current medications include olmesartan , amlodipine , and hydrochlorothiazide . Stress and grief may be contributing to elevated blood pressure. EKG shows normal sinus rhythm but indicates heart strain, likely due to uncontrolled hypertension and diabetes. - Rechecked blood pressure today showed improvement 154/90 - Ordered blood work including troponin, CBC, CMP, TSH, and free T4 - Continue close monitoring of blood pressure at home - Provided low sodium diet and Dash diet handout  Uncontrolled type 2 diabetes mellitus with hyperglycemia Diabetes is uncontrolled with recent A1c of 15.1% and blood sugar readings as high as 293 mg/dL. Symptoms include blurred vision, thirst, and dry mouth. She has not followed up with endocrinology as recommended by her PCP. - Schedule appointment with endocrinologist - Continue current medication but consider increasing insulin  dose, may titrate up to 24 units daily - Follow up with PCP  Obesity BMI  is 38, indicating obesity. Obesity is a risk factor for hypertension and diabetes. - Provided low sodium diet and Dash diet handout  Suspected obstructive sleep apnea Reports snoring and daytime  somnolence. Previous sleep study over ten years ago, results not recalled. Potential sleep apnea may contribute to hypertension and overall health issues. - Ordered home sleep test  Gastroesophageal reflux disease (GERD) Reports chest pressure relieved by burping, suggestive of GERD. Previous treatment with Maalox/Mylanta   Paresthesia of skin Reports numbness and tingling in arms, particularly the right arm, and cramping in legs which is not new. Symptoms may be related to uncontrolled diabetes or hypertension.  Other chest pain Intermittent chest pressure relieved by burping, likely related to GERD. EKG shows normal sinus rhythm, borderline left ventricular hypertrophy, and left atrial enlargement, but no evidence of acute coronary syndrome. Discussed that women may present with different symptoms of heart disease. - Ordered blood work including troponin to assess cardiac strain - Advised to seek immediate care if chest pain does not resolve     I have discontinued Ameshia N. Martindale's metroNIDAZOLE  and nitrofurantoin  (macrocrystal-monohydrate). I am also having her maintain her escitalopram , acetaminophen , Lantus  SoloStar, Insulin  Pen Needle, budesonide -formoterol , naproxen , HYDROcodone -acetaminophen , cyclobenzaprine , tirzepatide , Olmesartan -amLODIPine -HCTZ, meloxicam , tirzepatide , and Olmesartan -amLODIPine -HCTZ.  No orders of the defined types were placed in this encounter.

## 2024-11-07 NOTE — Patient Instructions (Signed)
 Please go downstairs for labs before you leave  Will be in touch with your results  Continue monitoring your blood pressure at home  Eat a diet low in sodium.  See the handout  I am ordering a home sleep test and you will be contacted to get this set up.  If your blood pressures are not improving, we can refer you to our pharmacist in the office to help you with medications.  Please follow-up with your PCP, Dr. Garald, and 2 to 3 months  Call and schedule with your endocrinologist immediately

## 2024-11-13 ENCOUNTER — Other Ambulatory Visit: Payer: Self-pay

## 2024-11-13 ENCOUNTER — Other Ambulatory Visit: Payer: Self-pay | Admitting: Family

## 2024-11-13 MED ORDER — METOPROLOL SUCCINATE ER 25 MG PO TB24: 25.0000 mg | ORAL_TABLET | Freq: Every day | ORAL | 3 refills | Status: DC

## 2024-11-13 NOTE — Telephone Encounter (Signed)
 Please advise, only note I see regarding change in BP meds is If your blood pressures are not improving, we can refer you to our pharmacist in the office to help you with medications.

## 2024-11-14 ENCOUNTER — Telehealth: Payer: Self-pay | Admitting: Internal Medicine

## 2024-11-14 NOTE — Telephone Encounter (Unsigned)
 Copied from CRM #[id number]. Topic: Clinical - Medication Question >> Nov 14, 2024 11:14 AM [name] [name] wrote: Reason for CRM: Patient sent a MyCHART message about blood pressure- diastolic number is already in the 100 range/ having headaches and feel that the medication is not working correctly  Advise patient that metoprolol succinate (TOPROL-XL) 25 MG 24 hr tablet was sent to pharmacy yesterday  Patient is asking if she should continue to take the Olmesartan -amLODIPine -HCTZ 40-10-25 MG TABS and the new medication or start the new medication only   Please call the patient back at [phone] to discuss

## 2024-11-14 NOTE — Telephone Encounter (Signed)
 Yes, please continue to take olmesartan -amlodipine -HCTZ with Toprol-XL.  Thanks

## 2024-11-15 NOTE — Telephone Encounter (Signed)
 PCP has stated the following  Yes, please continue to take olmesartan -amlodipine -HCTZ with Toprol-XL.  Thanks  Please inform the pt of the above upon her call back to the clinic.

## 2024-11-17 ENCOUNTER — Ambulatory Visit: Admitting: Internal Medicine

## 2024-11-17 ENCOUNTER — Encounter: Payer: Self-pay | Admitting: Internal Medicine

## 2024-11-17 ENCOUNTER — Telehealth: Payer: Self-pay

## 2024-11-17 VITALS — BP 142/80 | Ht 63.0 in | Wt 220.0 lb

## 2024-11-17 DIAGNOSIS — Z794 Long term (current) use of insulin: Secondary | ICD-10-CM

## 2024-11-17 DIAGNOSIS — Z7985 Long-term (current) use of injectable non-insulin antidiabetic drugs: Secondary | ICD-10-CM

## 2024-11-17 DIAGNOSIS — E1165 Type 2 diabetes mellitus with hyperglycemia: Secondary | ICD-10-CM

## 2024-11-17 LAB — POCT GLYCOSYLATED HEMOGLOBIN (HGB A1C): Hemoglobin A1C: 12.9 % — AB (ref 4.0–5.6)

## 2024-11-17 MED ORDER — TIRZEPATIDE 7.5 MG/0.5ML ~~LOC~~ SOAJ: 7.5000 mg | SUBCUTANEOUS | 2 refills | Status: AC

## 2024-11-17 MED ORDER — INSULIN PEN NEEDLE 32G X 4 MM MISC: 1.0000 | Freq: Four times a day (QID) | 2 refills | Status: AC

## 2024-11-17 MED ORDER — LANTUS SOLOSTAR 100 UNIT/ML ~~LOC~~ SOPN: 24.0000 [IU] | PEN_INJECTOR | Freq: Every day | SUBCUTANEOUS | 2 refills | Status: AC

## 2024-11-17 MED ORDER — NOVOLOG FLEXPEN 100 UNIT/ML ~~LOC~~ SOPN: 6.0000 [IU] | PEN_INJECTOR | Freq: Three times a day (TID) | SUBCUTANEOUS | 2 refills | Status: AC

## 2024-11-17 NOTE — Telephone Encounter (Signed)
 Copied from CRM [id number]. Topic: Clinical - Medical Advice >> Nov 17, 2024 11:00 AM [name] [name] wrote: Reason for CRM: Patient asking for a nurse to call her about the medications she is taking for BP

## 2024-11-17 NOTE — Progress Notes (Signed)
 Name: [name] [name]  Age/ Sex: 53 y.o., female   MRN/ DOB: [id number], 1971-09-22     PCP: Garald Karlynn GAILS, MD   Reason for Endocrinology Evaluation: Type 2 Diabetes Mellitus  Initial Endocrine Consultative Visit: 03/07/2014    PATIENT IDENTIFIER: [name] [name] is a 53 y.o. female with a past medical history of DM, HTN . The patient has followed with Endocrinology clinic since 03/07/2014 for consultative assistance with management of her diabetes.  DIABETIC HISTORY:  [name] [name] was diagnosed with DM 2011. Her hemoglobin A1c has ranged from 7.1% in 2020, peaking at 13.2% in 2024.   Pt also has small multinodular goiter (dx'ed 2015; US  showed the nodules too small to merit bx; f/u US  in 2020 showed no signif change, and no need for f/u; she has been euthyroid on no rx).  She denies neck swelling.   She had follow up with Dr. Kassie    Initial visit with me she had an A1c of 15.2% 12/2022 , she was on Trulicity  only, as she has self discontinued metformin , Jardiance  and repaglinide  ,we started Lantus   Mounjaro  was added in June, 2024   She was lost to follow-up for approximately a year and a half until her return in December, 2025  SUBJECTIVE:   During the last visit (05/26/2023): A1c 13.3%     Today (11/17/2024): [name] [name]  is here for a follow up on diabetes management. She has has been checking glucose occasionally, no hypoglycemia.  She has not been to our clinic in 18 months    No nausea or vomiting  Has occasional constipation but no diarrhea  She eats 2 meals a day, snacks occasionally   HOME DIABETES REGIMEN:  Mounjaro  5 mg daily  Lantus  24 units daily -she continues to take 20 units     Statin: no ACE-I/ARB: no    METER DOWNLOAD SUMMARY: Did not bring  129 - 300 mg/dL   DIABETIC COMPLICATIONS: Microvascular complications:   Denies: CKD, retinopathy, neuropathy  Last Eye Exam: Completed 11/2022  Macrovascular complications:   Denies:  CAD, CVA, PVD   HISTORY:  Past Medical History:  Past Medical History:  Diagnosis Date   Anemia, iron deficiency    Chronic headache    Dr Lindy, Neg MRI 2005   Diabetes mellitus    Elevated glucose 2011   GERD (gastroesophageal reflux disease)    HTN (hypertension)    Past Surgical History:  Past Surgical History:  Procedure Laterality Date   ABDOMINAL HYSTERECTOMY     BREAST REDUCTION SURGERY     Cyst removed from vagina     INDUCED ABORTION  1990-1994   x 4   NOSE SURGERY     PARTIAL HYSTERECTOMY  2010   TUBAL LIGATION     Social History:  reports that she has never smoked. She has never used smokeless tobacco. She reports current alcohol use. She reports that she does not use drugs. Family History:  Family History  Problem Relation Age of Onset   Hypertension Mother    Esophageal cancer Mother 27   Healthy Father    Hypertension Maternal Grandmother    Stroke Maternal Grandmother    Ovarian cancer Sister    Prostate cancer Maternal Grandfather      HOME MEDICATIONS: Allergies as of 11/17/2024       Reactions   Ciprofloxacin    REACTION: gittery   Tramadol Other (See Comments)   Headaches worse  Medication List        Accurate as of November 17, 2024  2:07 PM. If you have any questions, ask your nurse or doctor.          STOP taking these medications    tirzepatide  5 MG/0.5ML Pen Commonly known as: MOUNJARO  Replaced by: tirzepatide  7.5 MG/0.5ML Pen Stopped by: Chad Tiznado J Annett Boxwell       TAKE these medications    acetaminophen  500 MG tablet Commonly known as: TYLENOL  Take 2 tablets (1,000 mg total) by mouth every 6 (six) hours as needed.   budesonide -formoterol  160-4.5 MCG/ACT inhaler Commonly known as: SYMBICORT  INHALE 2 PUFFS INTO THE LUNGS TWICE DAILY   cyclobenzaprine  5 MG tablet Commonly known as: FLEXERIL  Take 1 tablet (5 mg total) by mouth every 8 (eight) hours as needed for muscle spasms.   escitalopram  5 MG  tablet Commonly known as: Lexapro  Take 1 tablet (5 mg total) by mouth at bedtime.   HYDROcodone -acetaminophen  5-325 MG tablet Commonly known as: NORCO/VICODIN Take 1 tablet by mouth every 6 (six) hours as needed for severe pain (pain score 7-10).   Insulin  Pen Needle 32G X 4 MM Misc 1 Device by Does not apply route in the morning, at noon, in the evening, and at bedtime. What changed: when to take this Changed by: Donell PARAS Alyxandra Tenbrink   Lantus  SoloStar 100 UNIT/ML Solostar Pen Generic drug: insulin  glargine Inject 24 Units into the skin daily.   meloxicam  15 MG tablet Commonly known as: MOBIC  Take 1 tablet (15 mg total) by mouth daily.   metoprolol  succinate 25 MG 24 hr tablet Commonly known as: TOPROL -XL Take 1 tablet (25 mg total) by mouth daily.   naproxen  500 MG tablet Commonly known as: NAPROSYN  Take 1 tablet (500 mg total) by mouth 2 (two) times daily as needed for moderate pain (pain score 4-6).   NovoLOG  FlexPen 100 UNIT/ML FlexPen Generic drug: insulin  aspart Inject 6 Units into the skin 3 (three) times daily with meals. Started by: Jarad Barth J Shayleen Eppinger   Olmesartan -amLODIPine -HCTZ 40-10-25 MG Tabs Take 1 tablet by mouth daily.   tirzepatide  7.5 MG/0.5ML Pen Commonly known as: MOUNJARO  Inject 7.5 mg into the skin once a week. Replaces: tirzepatide  5 MG/0.5ML Pen Started by: Donell PARAS Mauria Asquith         OBJECTIVE:   Vital Signs: BP (!) 142/80   Ht 5' 3 (1.6 m)   Wt 220 lb (99.8 kg)   BMI 38.97 kg/m   Wt Readings from Last 3 Encounters:  11/17/24 220 lb (99.8 kg)  11/07/24 214 lb 12.8 oz (97.4 kg)  09/07/24 219 lb 3.2 oz (99.4 kg)     Exam: General: Pt appears well and is in NAD  Lungs: Clear with good BS bilat   Heart: RRR   Abdomen:  soft, nontender  Extremities: No pretibial edema.   Neuro: MS is good with appropriate affect, pt is alert and Ox3   DM Foot Exam 11/17/2024  The skin of the feet is intact without sores or  ulcerations. The pedal pulses are 2+ on right and 2+ on left. The sensation is intact to a screening 5.07, 10 gram monofilament bilaterally    DATA REVIEWED:  Lab Results  Component Value Date   HGBA1C 12.9 (A) 11/17/2024   HGBA1C 15.1 (H) 09/07/2024   HGBA1C 8.1 (H) 11/15/2023    Latest Reference Range & Units 11/07/24 10:49  Sodium 135 - 145 mEq/L 138  Potassium 3.5 - 5.1 mEq/L 3.5 - 5.1  mEq/L 4.0 4.0  Chloride 96 - 112 mEq/L 98  CO2 19 - 32 mEq/L 31  Glucose 70 - 99 mg/dL 814 (H)  BUN 6 - 23 mg/dL 18  Creatinine 9.59 - 8.79 mg/dL 9.03  Calcium  8.4 - 10.5 mg/dL 9.4  Alkaline Phosphatase 39 - 117 U/L 75  Albumin 3.5 - 5.2 g/dL 4.4  AST 0 - 37 U/L 17  ALT 0 - 35 U/L 20  Total Protein 6.0 - 8.3 g/dL 8.0  Total Bilirubin 0.2 - 1.2 mg/dL 0.6  GFR >39.99 mL/min 67.37  (H): Data is abnormally high     ASSESSMENT / PLAN / RECOMMENDATIONS:   1) Type 2 Diabetes Mellitus, Poorly controlled, Without  complications - Most recent A1c of 12.9%. Goal A1c < 7.0 %.    -A1c remains above goal - She has not been to our clinic in a year and a half - Limited glucose data, I did encourage her to check glucose twice daily as she is twice daily - She continues to take less Lantus  than previously prescribed, will increase as below - Tolerating Mounjaro  will increase - I did recommend prandial insulin  as the quickest way to bring her glucose down, she is in agreement of this, discussed the importance of taking this right before the meal - She is to be on Trulicity  and we had to change due to lack of clinical outcome - We briefly discussed CGM technology, patient would like to postpone this for now   MEDICATIONS: Increase Lantus  24 units daily Increase Mounjaro  7.5 mg weekly Start NovoLog  6 units with each meal  EDUCATION / INSTRUCTIONS: BG monitoring instructions: Patient is instructed to check her blood sugars 2 times a day Call [facility] if: BG persistently < 70   I reviewed the Rule of 15 for the treatment of hypoglycemia in detail with the patient. Literature supplied.    2) Diabetic complications:  Eye: Does not have known diabetic retinopathy.  Neuro/ Feet: Does not have known diabetic peripheral neuropathy .  Renal: Patient does not have known baseline CKD. She   is not on an ACEI/ARB at present.     F/U in 3 months     Signed electronically by: Stefano Redgie Butts, MD  [facility] Endocrinology  [facility] [address] [place], [place] [address] Phone: [phone] FAX: [phone]   CC: Garald Karlynn GAILS, MD [address] [place] [address] Phone: [phone]  Fax: [phone]  Return to Endocrinology clinic as below: Future Appointments  Date Time Provider Department Center  11/17/2024  2:20 PM Maebelle Sulton, Donell Redgie, MD [facility] None  12/28/2024  3:00 PM Plotnikov, Karlynn GAILS, MD [facility]  01/25/2025  3:20 PM Plotnikov, Karlynn GAILS, MD [facility]

## 2024-11-17 NOTE — Patient Instructions (Signed)
 Increase Mounjaro  7.5 mg weekly Increase  lantus  24  units once daily Start NovoLog  6 units with each meal   HOW TO TREAT LOW BLOOD SUGARS (Blood sugar LESS THAN 70 MG/DL) Please follow the RULE OF 15 for the treatment of hypoglycemia treatment (when your (blood sugars are less than 70 mg/dL)   STEP 1: Take 15 grams of carbohydrates when your blood sugar is low, which includes:  3-4 GLUCOSE TABS  OR 3-4 OZ OF JUICE OR REGULAR SODA OR ONE TUBE OF GLUCOSE GEL    STEP 2: RECHECK blood sugar in 15 MINUTES STEP 3: If your blood sugar is still low at the 15 minute recheck --> then, go back to STEP 1 and treat AGAIN with another 15 grams of carbohydrates.

## 2024-11-20 ENCOUNTER — Ambulatory Visit: Payer: Self-pay | Admitting: *Deleted

## 2024-11-20 NOTE — Telephone Encounter (Signed)
 Pt states when she takes the Metoprolol  it is causing her to have pain at the roof of her mouth. Pt is stating when taking the BP meds it is only taking her BP down to the 140's over the 90's-100's.  Pt states this is making her anxious and can not get in until January to be seen. Pt is asking can a different medication be sent in place of the Metoprolol  due to above reaction.

## 2024-11-20 NOTE — Addendum Note (Signed)
 Addended by: HEDDY IP R on: 11/20/2024 11:48 AM   Modules accepted: Orders

## 2024-11-20 NOTE — Telephone Encounter (Signed)
 FYI Only or Action Required?: Action required by provider: request for appointment.  Patient was last seen in primary care on 11/07/2024 by [name] Boby CROME, NP-C.  Called Nurse Triage reporting Hypertension.  Symptoms began several days ago.  Interventions attempted: Prescription medications: Patient taking prescibed BP medications - can not get normal readings.  Symptoms are: gradually worsening.  Triage Disposition: See PCP When Office is Open (Within 3 Days)  Patient/caregiver understands and will follow disposition?: No, wishes to speak with PCP- only will see PCP  Copied from CRM #[id number]. Topic: Clinical - Red Word Triage >> Nov 20, 2024 10:41 AM [name] [name] wrote: [facility] that prompted transfer to Nurse Triage: Pt calling states she is having increased blood pressure, worsening anxiety, along with symptoms of headache, chest pressure and discomfort, thinks it is indigestion.  Pt also reports she has been continually trying to contact office, but no response. She states she feels like no one is listening to her regarding her health.   She would like to speak with nurse regarding her concerns.   Blood pressure readings have been  148/102 pulse 105 146/111 pulse 97  Ph. [phone] Reason for Disposition  [1] Taking BP medications AND [2] feels is having side effects (e.g., impotence, cough, dizzy upon standing)  Answer Assessment - Initial Assessment Questions 1. BLOOD PRESSURE: What is your blood pressure? Did you take at least two measurements 5 minutes apart?     148/102, 96, 134/99,90 2. ONSET: When did you take your blood pressure?     This morning- 1:11am, 5:32am 3. HOW: How did you take your blood pressure? (e.g., automatic home BP monitor, visiting nurse)     Auto cuff- arm 4. HISTORY: Do you have a history of high blood pressure?     Yes- patient has been in office for elevated BP-she was supposed to send medication - finally got started 5. MEDICINES:  Are you taking any medicines for blood pressure? Have you missed any doses recently?     Yes- not bringing BP down 6. OTHER SYMPTOMS: Do you have any symptoms? (e.g., blurred vision, chest pain, difficulty breathing, headache, weakness)     Increased anxiety, headache, elevated glucose- 347- early am (took 24 units insulin )- 240- 5:00am- unable to check at work  Patient states she is having a hard time with medication- BP medication is not bringing BP down into normal range and patient is having a hard time with glucose control also. Patient has been to see endocrinology- but does not really agree with her plan- does not want to increase Monjaro dose due to SE. Patient is having increased anxiety and only wants to see PCP- unable to find open appointment with PCP will send request for appointment as soon as possible.Patient is almost to the point of crying- she is very anxious.  Protocols used: Blood Pressure - High-A-AH

## 2024-11-21 ENCOUNTER — Other Ambulatory Visit: Payer: Self-pay | Admitting: Internal Medicine

## 2024-11-21 MED ORDER — CARVEDILOL 25 MG PO TABS: 25.0000 mg | ORAL_TABLET | Freq: Two times a day (BID) | ORAL | 11 refills | Status: AC

## 2024-11-21 NOTE — Telephone Encounter (Signed)
 I was able to send the below information to the pt for her to contact...  Appointment Scheduling:   We recognize that you are likely looking forward to scheduling your appointment.  Your referral has been sent to the appropriate office for scheduling. Usually, it takes 3-5 business days to process referrals once they are received.  Please allow this time for the office handling your referral to contact you directly to arrange your appointment.  If you have not been contacted within 5 business days, please call [phone] to make an appointment.

## 2024-11-21 NOTE — Telephone Encounter (Signed)
 Noted.  Discontinue metoprolol . Start Coreg  25 mg tablet 1/2 tablet twice a day, increase to 1 tablet twice a day in 1 week.  Schedule a follow-up visit. Thanks

## 2024-11-22 ENCOUNTER — Emergency Department (HOSPITAL_BASED_OUTPATIENT_CLINIC_OR_DEPARTMENT_OTHER)
Admission: EM | Admit: 2024-11-22 | Discharge: 2024-11-22 | Disposition: A | Discharge status: Home / Self Care | Attending: Emergency Medicine | Admitting: Emergency Medicine

## 2024-11-22 ENCOUNTER — Encounter (HOSPITAL_BASED_OUTPATIENT_CLINIC_OR_DEPARTMENT_OTHER): Payer: Self-pay | Admitting: Emergency Medicine

## 2024-11-22 ENCOUNTER — Other Ambulatory Visit: Payer: Self-pay

## 2024-11-22 ENCOUNTER — Other Ambulatory Visit (HOSPITAL_BASED_OUTPATIENT_CLINIC_OR_DEPARTMENT_OTHER): Payer: Self-pay

## 2024-11-22 DIAGNOSIS — E119 Type 2 diabetes mellitus without complications: Secondary | ICD-10-CM | POA: Insufficient documentation

## 2024-11-22 DIAGNOSIS — R079 Chest pain, unspecified: Secondary | ICD-10-CM

## 2024-11-22 DIAGNOSIS — Z794 Long term (current) use of insulin: Secondary | ICD-10-CM | POA: Diagnosis not present

## 2024-11-22 DIAGNOSIS — D72829 Elevated white blood cell count, unspecified: Secondary | ICD-10-CM | POA: Diagnosis not present

## 2024-11-22 DIAGNOSIS — I10 Essential (primary) hypertension: Secondary | ICD-10-CM | POA: Insufficient documentation

## 2024-11-22 LAB — TROPONIN T, HIGH SENSITIVITY
Troponin T High Sensitivity: <15 ng/L (ref 0–19)
Troponin T High Sensitivity: <15 ng/L (ref 0–19)

## 2024-11-22 LAB — URINALYSIS, ROUTINE W REFLEX MICROSCOPIC
Bilirubin Urine: NEGATIVE
Glucose, UA: NEGATIVE mg/dL
Hgb urine dipstick: NEGATIVE
Ketones, ur: NEGATIVE mg/dL
Leukocytes,Ua: NEGATIVE
Nitrite: NEGATIVE
Protein, ur: NEGATIVE mg/dL
Specific Gravity, Urine: 1.009 (ref 1.005–1.030)
pH: 7 (ref 5.0–8.0)

## 2024-11-22 LAB — CBC WITH DIFFERENTIAL/PLATELET
Abs Immature Granulocytes: 0.04 K/uL (ref 0.00–0.07)
Basophils Absolute: 0.1 K/uL (ref 0.0–0.1)
Basophils Relative: 0 %
Eosinophils Absolute: 0.2 K/uL (ref 0.0–0.5)
Eosinophils Relative: 1 %
HCT: 42.9 % (ref 36.0–46.0)
Hemoglobin: 14.5 g/dL (ref 12.0–15.0)
Immature Granulocytes: 0 %
Lymphocytes Relative: 28 %
Lymphs Abs: 3.8 K/uL (ref 0.7–4.0)
MCH: 28.4 pg (ref 26.0–34.0)
MCHC: 33.8 g/dL (ref 30.0–36.0)
MCV: 84 fL (ref 80.0–100.0)
Monocytes Absolute: 1.3 K/uL — ABNORMAL HIGH (ref 0.1–1.0)
Monocytes Relative: 10 %
Neutro Abs: 8.1 K/uL — ABNORMAL HIGH (ref 1.7–7.7)
Neutrophils Relative %: 61 %
Platelets: 306 K/uL (ref 150–400)
RBC: 5.11 MIL/uL (ref 3.87–5.11)
RDW: 13.7 % (ref 11.5–15.5)
WBC: 13.5 K/uL — ABNORMAL HIGH (ref 4.0–10.5)
nRBC: 0 % (ref 0.0–0.2)

## 2024-11-22 LAB — COMPREHENSIVE METABOLIC PANEL WITH GFR
ALT: 30 U/L (ref 0–44)
AST: 22 U/L (ref 15–41)
Albumin: 4 g/dL (ref 3.5–5.0)
Alkaline Phosphatase: 91 U/L (ref 38–126)
Anion gap: 12 (ref 5–15)
BUN: 21 mg/dL — ABNORMAL HIGH (ref 6–20)
CO2: 30 mmol/L (ref 22–32)
Calcium: 9.6 mg/dL (ref 8.9–10.3)
Chloride: 97 mmol/L — ABNORMAL LOW (ref 98–111)
Creatinine, Ser: 0.99 mg/dL (ref 0.44–1.00)
GFR, Estimated: >60 mL/min (ref >60)
Glucose, Bld: 105 mg/dL — ABNORMAL HIGH (ref 70–99)
Potassium: 3.6 mmol/L (ref 3.5–5.1)
Sodium: 139 mmol/L (ref 135–145)
Total Bilirubin: 0.5 mg/dL (ref 0.0–1.2)
Total Protein: 7.5 g/dL (ref 6.5–8.1)

## 2024-11-22 MED ORDER — HYDROXYZINE HCL 25 MG PO TABS
25.0000 mg | ORAL_TABLET | Freq: Four times a day (QID) | ORAL | 0 refills | Status: AC
  Filled 2024-11-22: qty 12, 3d supply, fill #0

## 2024-11-22 MED ORDER — ACETAMINOPHEN 325 MG PO TABS
650.0000 mg | ORAL_TABLET | Freq: Once | ORAL | Status: AC
  Administered 2024-11-22: 650 mg via ORAL
  Filled 2024-11-22: qty 2

## 2024-11-22 MED ORDER — OMEPRAZOLE 20 MG PO CPDR
20.0000 mg | DELAYED_RELEASE_CAPSULE | Freq: Every day | ORAL | 0 refills | Status: AC
  Filled 2024-11-22: qty 7, 7d supply, fill #0

## 2024-11-22 NOTE — Discharge Instructions (Addendum)
 Although your blood pressure is elevated, it is not in a range where I would be concerned that would cause damage to your body. If the top number of your blood pressure starts to get over 180 consistently and you are having worsening chest pain or a headache please return to the emergency department. I think following up with your primary care doctor could also be beneficial so that you can get better control of your blood pressure.   For your chest pain we made sure that your heart was not showing any signs of stress  I have given you some prilosec which is for your acid reflux which you can take daily to help with your symptoms I have also sent you home with some medication called hydroxyzine  that helps with anxiety. This medication can make you sleepy so please take it when you do not have to be alert

## 2024-11-22 NOTE — ED Provider Notes (Signed)
 [place] EMERGENCY DEPARTMENT AT [facility] Provider Note   CSN: [id number] Arrival date & time: 11/22/24  9258     Patient presents with: Hypertension   [name] [name] is a 53 y.o. female with PMH of HTN, Uncontrolled Type 2 Diabetes Mellitus, multinodular goiter, GERD, insomnia, hyperlipidemia that presents today for chest pain that started this morning as well as concerns of hypertension.  Patient states she has been trying to follow-up with her primary care doctor to get her blood pressure under control.  She states that in the past when she was on olmesartan  she seemed to respond well to it but is not responding well now. She also reports headache that she states is at the top of her head that she has chronically, but no vision changes associated with this.   She did have a log of all of her blood pressures that she has taken on her phone.  Systolics range from 120s-150s with diastolics in the 110s.  She states that she also had some numbness and tingling in her legs bilaterally last night that resolved.  She took 2 aspirin  at that time.   The reason why she came to the emergency room was that she was having chest pain that started this morning. She states that this felt different than her GERD like symptoms that she normally has.  She is also having associated nausea with this chest pain. Patient states that she is very anxious that she has noted a couple people recently that were young and have passed away.  Therefore she has been more anxious about her health overall.   Hypertension       Prior to Admission medications   Medication Sig Start Date End Date Taking? Authorizing Provider  acetaminophen  (TYLENOL ) 500 MG tablet Take 2 tablets (1,000 mg total) by mouth every 6 (six) hours as needed. 06/10/22   Enedelia Dorna HERO, FNP  budesonide -formoterol  (SYMBICORT ) 160-4.5 MCG/ACT inhaler INHALE 2 PUFFS INTO THE LUNGS TWICE DAILY 08/03/23   Plotnikov, Aleksei V, MD   carvedilol  (COREG ) 25 MG tablet Take 1 tablet (25 mg total) by mouth 2 (two) times daily. 11/21/24 11/21/25  Plotnikov, Karlynn GAILS, MD  cyclobenzaprine  (FLEXERIL ) 5 MG tablet Take 1 tablet (5 mg total) by mouth every 8 (eight) hours as needed for muscle spasms. 03/16/24   White, Elizabeth A, PA-C  escitalopram  (LEXAPRO ) 5 MG tablet Take 1 tablet (5 mg total) by mouth at bedtime. 03/17/22   Plotnikov, Aleksei V, MD  HYDROcodone -acetaminophen  (NORCO/VICODIN) 5-325 MG tablet Take 1 tablet by mouth every 6 (six) hours as needed for severe pain (pain score 7-10). 12/13/23 12/12/24  Plotnikov, Karlynn GAILS, MD  insulin  aspart (NOVOLOG  FLEXPEN) 100 UNIT/ML FlexPen Inject 6 Units into the skin 3 (three) times daily with meals. 11/17/24   Shamleffer, Ibtehal Jaralla, MD  insulin  glargine (LANTUS  SOLOSTAR) 100 UNIT/ML Solostar Pen Inject 24 Units into the skin daily. 11/17/24   Shamleffer, Ibtehal Jaralla, MD  Insulin  Pen Needle 32G X 4 MM MISC 1 Device by Does not apply route in the morning, at noon, in the evening, and at bedtime. 11/17/24   Shamleffer, Ibtehal Jaralla, MD  meloxicam  (MOBIC ) 15 MG tablet Take 1 tablet (15 mg total) by mouth daily. 09/07/24   Plotnikov, Aleksei V, MD  naproxen  (NAPROSYN ) 500 MG tablet Take 1 tablet (500 mg total) by mouth 2 (two) times daily as needed for moderate pain (pain score 4-6). 09/29/23   Plotnikov, Karlynn GAILS, MD  Olmesartan -amLODIPine -HCTZ 40-10-25  MG TABS Take 1 tablet by mouth daily. 09/07/24   Plotnikov, Aleksei V, MD  tirzepatide  (MOUNJARO ) 7.5 MG/0.5ML Pen Inject 7.5 mg into the skin once a week. 11/17/24   Shamleffer, Ibtehal Jaralla, MD    Allergies: Metoprolol , Ciprofloxacin, and Tramadol    Review of Systems As noted in HPI Updated Vital Signs BP (!) 154/92   Pulse (!) 102   Temp 98 F (36.7 C)   Resp 16   Wt 97.1 kg   SpO2 100%   BMI 37.91 kg/m   Physical Exam Constitutional:      General: She is not in acute distress. Cardiovascular:     Rate and  Rhythm: Regular rhythm. Tachycardia present.     Heart sounds: No murmur heard. Abdominal:     Palpations: Abdomen is soft.     Tenderness: There is no guarding or rebound.     Comments: Some epigastric pain  Musculoskeletal:     Cervical back: Tenderness present.     Right lower leg: No edema.     Left lower leg: No edema.     Comments: No calf tenderness bilaterally  Neurological:     Mental Status: She is alert and oriented to person, place, and time.     (all labs ordered are listed, but only abnormal results are displayed) Labs Reviewed - No data to display  EKG: None  Radiology: No results found.   Procedures   Medications Ordered in the ED - No data to display                                  Medical Decision Making Amount and/or Complexity of Data Reviewed Labs: ordered.  Risk OTC drugs.   Differential includes ACS, hypertensive crisis, pulmonary embolism, cardiac arrhythmia, stable angina, GERD, costochondritis, peptic ulcer disease, panic disorder, pneumonia  Initial impression is that patient is not in any acute distress.Upon chart review patient has had a history of hip pain that she is seen by her primary care provider for.  At that time troponin checked and normal and it was thought to likely be GERD related.  Patient states that this pain is different than her previous chest pain and would like to rule out ACS with her history of hypertension and type 2 diabetes, she does have risk factors for atypical chest pain.  Will also get basic blood work to check for any signs of anemia and CMP as patient did have some mild epigastric tenderness to rule out any liver pathology or gallbladder.   Troponins are flat and negative and EKG shows sinus rhythm with no acute ischemic changes. CBC shows mild elevation in leukocytosis at 13.5 from about 12 two weeks ago. Patient asked for UA although was not having any symptoms and UA was clear of any signs of infection    Did discuss with patient that blood pressure is elevated but I have a lower differential for hypertensive crisis. Will need outpatient follow up for further BP management. Did discuss that we can send her home with some prilosec for GERD and hydroxyzine  to use for anxiety PRN till she follows up with PCP. Did discuss that hydroxyzine  can make her lethargic so that she should not be doing anything active or that needs attention when using this. Chest pain is likely due to MSK vs anxiety      Final diagnoses:  None    ED Discharge Orders  None          D'Mello, Rama Mcclintock, DO 11/22/24 1253    Tonia Chew, MD 11/22/24 [phone]

## 2024-11-22 NOTE — ED Triage Notes (Signed)
 Pt reports b/p has been running high since Thanksgiving. B/p not responding to current medication. Endorses HA

## 2024-12-25 ENCOUNTER — Ambulatory Visit (HOSPITAL_BASED_OUTPATIENT_CLINIC_OR_DEPARTMENT_OTHER): Attending: Family Medicine | Admitting: Pulmonary Disease

## 2024-12-25 DIAGNOSIS — G4733 Obstructive sleep apnea (adult) (pediatric): Secondary | ICD-10-CM | POA: Diagnosis not present

## 2024-12-25 DIAGNOSIS — E669 Obesity, unspecified: Secondary | ICD-10-CM | POA: Insufficient documentation

## 2024-12-25 DIAGNOSIS — I10 Essential (primary) hypertension: Secondary | ICD-10-CM | POA: Insufficient documentation

## 2024-12-25 DIAGNOSIS — Z6837 Body mass index (BMI) 37.0-37.9, adult: Secondary | ICD-10-CM | POA: Insufficient documentation

## 2024-12-25 DIAGNOSIS — R0683 Snoring: Secondary | ICD-10-CM | POA: Insufficient documentation

## 2024-12-28 ENCOUNTER — Ambulatory Visit: Admitting: Internal Medicine

## 2024-12-31 DIAGNOSIS — I10 Essential (primary) hypertension: Secondary | ICD-10-CM

## 2024-12-31 NOTE — Procedures (Signed)
 Darryle Law [facility] [address] [place], [place] [address] Tel: [phone]   Fax: [phone]  Home Sleep Test Interpretation  Patient Name: [name] [name], [name] [name] Date: 12/25/2024  Date of Birth: 05-30-1971 Study Type: HST  Age: 54 year MRN #: [id number]  Sex: Female Interpreting Physician: NEDA HAMMOND, [id number]  Height: 5' 4 Referring Physician: BOBY MACKINTOSH [phone])  Weight: 214.0 lbs Recording Tech: Will Poet RRT RPSGT RST  BMI: 37.0 Scoring Tech: Holly Neeriemer RPSGT RST  ESS: 14 Neck Size: 14.25    Indications for Polysomnography The patient is a 54 year-old Female who is 5' 4 and weighs 214.0 lbs. Her BMI equals 37.0.  A home sleep apnea test was performed to evaluate for -.  Medication  No Data.   Polysomnogram Data A home sleep test recorded the standard physiologic parameters including EKG, nasal and oral airflow.  Respiratory parameters of chest and abdominal movements were recorded with Respiratory Inductance Plethysmography belts.  Oxygen saturation was recorded by pulse oximetry.   Study Architecture The total recording time of the polysomnogram was 382.5 minutes.  The total monitoring time was 383.0 minutes.  Time spent in Supine position was 338.0 minutes.   Respiratory Events The study revealed a presence of - obstructive, 2 central, and - mixed apneas resulting in an Apnea index of 0.3 events per hour.  There were 88 hypopneas (>=3% desaturation and/or arousal) resulting in an Apnea\Hypopnea Index (AHI >=3% desaturation and/or arousal) of 14.1 events per hour.  There were 40 hypopneas (>=4% desaturation) resulting in an Apnea\Hypopnea Index (AHI >=4% desaturation) of 6.6 events per hour.  There were - Respiratory Effort Related Arousals resulting in a RERA index of - events per hour. The Respiratory Disturbance Index is 14.1 events per hour.  The snore index was 24.1 events per hour.  Mean oxygen saturation was 96.6%.  The  lowest oxygen saturation during monitoring time was 85.0%.  Time spent <=88% oxygen saturation was 0.6 minutes (0.2%).  Cardiac Summary The average pulse rate was 89.1 bpm.  The minimum pulse rate was 68.0 bpm while the maximum pulse rate was 107.0 bpm.  Cardiac rhythm was normal/abnormal.  Comments: Patient had a home sleep study performed  Diagnosis:  Mild obstructive sleep apnea with mild oxygen desaturations AHI of 14.1, O2 nadir of 85% Saturations below 88% for 0.6 minutes  Recommendations: Options for treating mild obstructive sleep apnea may include CPAP therapy if there are significant daytime symptoms or notable comorbidities. Auto CPAP 5-15 with heated humidification and the patient's preferred mask may be considered; other treatment options may include an oral device, watchful waiting with significant weight loss efforts. Avoid alcohol, sedatives and other CNS depressants that may worsen sleep apnea and disrupt normal sleep architecture. Sleep hygiene should be reviewed to assess factors that may improve sleep quality. Weight management and regular exercise should be initiated or continued  Follow-up optimization of treatment  This study was personally reviewed and electronically signed by: Hammond Neda, MD Accredited Board Certified in Sleep Medicine Date/Time:  12/31/24       Study Overview  Recording Time: 556.9 min. Monitoring Time: 383.0 min.  Analysis Start:  10:39:23 PM Supine Time: 338.0 min.  Analysis Stop:  05:01:53 AM     Study Summary   Count Index Longest Event Duration  Apneas & Hypopneas: 90 14.1  Apneas: 14.5 sec.     Hypopneas: 39.2 sec.  RERAs: - - - sec.  Desaturations: 91 14.3 49.0 sec.  Snores:  154 24.1 7.9 sec.    Minimum Oxygen Saturation: 85.0%    Respiratory Summary   Total Duration Supine Non-Supine   Count Index Average Longest Count Index Count Index  Obstructive Apnea - - - - - - - -   Mixed Apnea - - - - - - - -    Central Apnea 2 0.3 14.1 14.5 2 0.4 - -   Total Apneas 2 0.3 14.1 14.5 2 0.4 - -            Hypopneas 3% 88 13.8 N.A. N.A. 81 14.4 7 9.3   Apneas & Hyp. 3% 90 14.1 N.A. N.A. 83 14.7 7 9.3            Hypopneas 4% 40 6.3 N.A. N.A. 37 6.6 3 4.0  Apneas & Hyp. 4% 42 6.6 N.A. N.A. 39 6.9 3 4.0             RERAs - - - - - - - -  RDI 91 14.3 N.A. N.A. 84 14.9 7 9.3   Oxygen Saturation Summary   Total Supine Non-Supine  Average SpO2 96.6% 96.4% 98.0%  Minimum SpO2 85.0% 85.0% 86.0%   Maximum SpO2 100.0% 100.0% 100.0%   Oxygen Saturation Distribution  Range (%) Time in range (min) Time in range (%)  90.0 - 100.0 380.4 99.7%  80.0 - 90.0 1.3 0.3%  70.0 - 80.0 - -  60.0 - 70.0 - -  50.0 - 60.0 - -  0.0 - 50.0 - -  Time Spent <=88% SpO2  Range (%) Time in range (min) Time in range (%)  0.0 - 88.0 0.6 0.2%  Cardiac Summary   Total Supine Non-Supine  Average Pulse Rate (BPM) 89.1 89.7 84.5  Minimum Pulse Rate (BPM) 68.0 72.0 68.0  Maximum Pulse Rate (BPM) 107.0 107.0 102.0                     Technologist Comments  -

## 2025-01-10 NOTE — Procedures (Signed)
" °%%  startinterp%% Indications for Polysomnography The patient is a 54 year-old Female who is 5' 4 and weighs 214.0 lbs. Her BMI equals 37.0.  A home sleep apnea test was performed to evaluate for -.  MedicationNo Data. Polysomnogram Data A home sleep test recorded the standard physiologic parameters including EKG, nasal and oral airflow.  Respiratory parameters of chest and abdominal movements were recorded with Respiratory Inductance Plethysmography belts.  Oxygen saturation was  recorded by pulse oximetry.  Study Architecture The total recording time of the polysomnogram was 382.5 minutes.  The total monitoring time was 383.0 minutes.  Time spent in Supine position was 338.0 minutes.  Respiratory Events The study revealed a presence of - obstructive, 2 central, and - mixed apneas resulting in an Apnea index of 0.3 events per hour.  There were 88 hypopneas (GreaterEqual to3% desaturation and/or arousal) resulting in an Apnea\Hypopnea Index (AHI  GreaterEqual to3% desaturation and/or arousal) of 14.1 events per hour.  There were 40 hypopneas (GreaterEqual to4% desaturation) resulting in an Apnea\Hypopnea Index (AHI GreaterEqual to4% desaturation) of 6.6 events per hour.  There were - Respiratory  Effort Related Arousals resulting in a RERA index of - events per hour. The Respiratory Disturbance Index is 14.1 events per hour.  The snore index was 24.1 events per hour.  Mean oxygen saturation was 96.6%.  The lowest oxygen saturation during monitoring time was 85.0%.  Time spent LessEqual to88% oxygen saturation was  minutes ().  Cardiac Summary The average pulse rate was 89.1 bpm.  The minimum pulse rate was 68.0 bpm while the maximum pulse rate was 107.0 bpm.  Cardiac rhythm was normal/abnormal.  Comments: Patient had a home sleep study performed  Diagnosis: Mild obstructive sleep apnea with mild oxygen desaturations AHI of 14.1, O2 nadir of 85% Saturations below 88% for 0.6  minutes  Recommendations: Options for treating mild obstructive sleep apnea may include CPAP therapy if there are significant daytime symptoms or notable comorbidities. Auto CPAP 5-15 with heated humidification and the patient's preferred mask may be considered; other treatment  options may include an oral device, watchful waiting with significant weight loss efforts. Avoid alcohol, sedatives and other CNS depressants that may worsen sleep apnea and disrupt normal sleep architecture. Sleep hygiene should be reviewed to assess factors that may improve sleep quality. Weight management and regular exercise should be initiated or continued  Follow-up optimization of treatment  This study was personally reviewed and electronically signed by: Jennet Epley, MD Accredited Board Certified in Sleep Medicine Date/Time: 12/31/24 "

## 2025-01-11 ENCOUNTER — Other Ambulatory Visit: Payer: Self-pay | Admitting: Internal Medicine

## 2025-01-11 ENCOUNTER — Other Ambulatory Visit: Payer: Self-pay

## 2025-01-11 MED ORDER — MOUNJARO 5 MG/0.5ML ~~LOC~~ SOAJ
5.0000 mg | SUBCUTANEOUS | 5 refills | Status: AC
  Filled 2025-01-11: qty 2, 28d supply, fill #0

## 2025-01-25 ENCOUNTER — Ambulatory Visit: Admitting: Internal Medicine

## 2025-02-28 ENCOUNTER — Ambulatory Visit: Admitting: Internal Medicine
